# Patient Record
Sex: Female | Born: 1940 | Race: White | Hispanic: No | Smoking: Never smoker
Health system: Southern US, Community
[De-identification: ages and names within clinical notes are randomized; demographics above are authoritative.]

## PROBLEM LIST (undated history)

## (undated) DIAGNOSIS — M1990 Unspecified osteoarthritis, unspecified site: Secondary | ICD-10-CM

## (undated) DIAGNOSIS — I10 Essential (primary) hypertension: Secondary | ICD-10-CM

## (undated) DIAGNOSIS — C8590 Non-Hodgkin lymphoma, unspecified, unspecified site: Secondary | ICD-10-CM

## (undated) DIAGNOSIS — K219 Gastro-esophageal reflux disease without esophagitis: Secondary | ICD-10-CM

## (undated) DIAGNOSIS — E7800 Pure hypercholesterolemia, unspecified: Secondary | ICD-10-CM

## (undated) DIAGNOSIS — F0390 Unspecified dementia without behavioral disturbance: Secondary | ICD-10-CM

## (undated) DIAGNOSIS — R0600 Dyspnea, unspecified: Secondary | ICD-10-CM

## (undated) HISTORY — PX: TONSILLECTOMY: SUR1361

---

## 2000-04-18 ENCOUNTER — Emergency Department (HOSPITAL_COMMUNITY): Admission: EM | Admit: 2000-04-18 | Discharge: 2000-04-18 | Payer: Self-pay | Admitting: Emergency Medicine

## 2000-04-18 ENCOUNTER — Encounter: Payer: Self-pay | Admitting: Emergency Medicine

## 2009-11-05 ENCOUNTER — Emergency Department (HOSPITAL_COMMUNITY): Admission: EM | Admit: 2009-11-05 | Discharge: 2009-11-05 | Payer: Self-pay | Admitting: Emergency Medicine

## 2011-05-15 ENCOUNTER — Other Ambulatory Visit (HOSPITAL_COMMUNITY): Payer: Self-pay | Admitting: Internal Medicine

## 2011-05-15 ENCOUNTER — Ambulatory Visit (HOSPITAL_COMMUNITY)
Admission: RE | Admit: 2011-05-15 | Discharge: 2011-05-15 | Disposition: A | Discharge status: Home / Self Care | Payer: Medicare Other | Source: Ambulatory Visit | Attending: Internal Medicine | Admitting: Internal Medicine

## 2011-05-15 DIAGNOSIS — M79609 Pain in unspecified limb: Secondary | ICD-10-CM | POA: Insufficient documentation

## 2011-05-15 DIAGNOSIS — M542 Cervicalgia: Secondary | ICD-10-CM | POA: Insufficient documentation

## 2011-05-15 DIAGNOSIS — M5030 Other cervical disc degeneration, unspecified cervical region: Secondary | ICD-10-CM | POA: Insufficient documentation

## 2011-05-15 DIAGNOSIS — R52 Pain, unspecified: Secondary | ICD-10-CM

## 2011-05-15 DIAGNOSIS — M5380 Other specified dorsopathies, site unspecified: Secondary | ICD-10-CM | POA: Insufficient documentation

## 2011-05-26 ENCOUNTER — Other Ambulatory Visit (HOSPITAL_COMMUNITY): Payer: Self-pay | Admitting: Internal Medicine

## 2011-05-26 DIAGNOSIS — M542 Cervicalgia: Secondary | ICD-10-CM

## 2011-05-31 ENCOUNTER — Ambulatory Visit (HOSPITAL_COMMUNITY)
Admission: RE | Admit: 2011-05-31 | Discharge: 2011-05-31 | Disposition: A | Discharge status: Home / Self Care | Payer: Medicare Other | Source: Ambulatory Visit | Attending: Internal Medicine | Admitting: Internal Medicine

## 2011-05-31 DIAGNOSIS — M5030 Other cervical disc degeneration, unspecified cervical region: Secondary | ICD-10-CM | POA: Insufficient documentation

## 2011-05-31 DIAGNOSIS — M542 Cervicalgia: Secondary | ICD-10-CM

## 2011-05-31 DIAGNOSIS — M5380 Other specified dorsopathies, site unspecified: Secondary | ICD-10-CM | POA: Insufficient documentation

## 2011-07-28 ENCOUNTER — Other Ambulatory Visit: Payer: Self-pay | Admitting: Neurosurgery

## 2011-07-28 DIAGNOSIS — M47812 Spondylosis without myelopathy or radiculopathy, cervical region: Secondary | ICD-10-CM

## 2011-07-31 ENCOUNTER — Ambulatory Visit
Admission: RE | Admit: 2011-07-31 | Discharge: 2011-07-31 | Disposition: A | Discharge status: Home / Self Care | Payer: Medicare Other | Source: Ambulatory Visit | Attending: Neurosurgery | Admitting: Neurosurgery

## 2011-07-31 DIAGNOSIS — M47812 Spondylosis without myelopathy or radiculopathy, cervical region: Secondary | ICD-10-CM

## 2011-07-31 MED ORDER — IOHEXOL 300 MG/ML  SOLN
1.0000 mL | Freq: Once | INTRAMUSCULAR | Status: AC | PRN
  Administered 2011-07-31: 1 mL via INTRA_ARTICULAR

## 2011-07-31 MED ORDER — TRIAMCINOLONE ACETONIDE 40 MG/ML IJ SUSP (RADIOLOGY)
20.0000 mg | Freq: Once | INTRAMUSCULAR | Status: AC
  Administered 2011-07-31: 20 mg via INTRA_ARTICULAR

## 2012-10-29 ENCOUNTER — Other Ambulatory Visit (HOSPITAL_COMMUNITY): Payer: Self-pay | Admitting: Internal Medicine

## 2012-10-29 DIAGNOSIS — Z139 Encounter for screening, unspecified: Secondary | ICD-10-CM

## 2012-10-31 ENCOUNTER — Ambulatory Visit (HOSPITAL_COMMUNITY)
Admission: RE | Admit: 2012-10-31 | Discharge: 2012-10-31 | Disposition: A | Discharge status: Home / Self Care | Payer: Medicare Other | Source: Ambulatory Visit | Attending: Internal Medicine | Admitting: Internal Medicine

## 2012-10-31 DIAGNOSIS — Z139 Encounter for screening, unspecified: Secondary | ICD-10-CM

## 2012-10-31 DIAGNOSIS — Z1231 Encounter for screening mammogram for malignant neoplasm of breast: Secondary | ICD-10-CM | POA: Insufficient documentation

## 2012-11-15 ENCOUNTER — Ambulatory Visit (HOSPITAL_COMMUNITY)
Admission: RE | Admit: 2012-11-15 | Discharge: 2012-11-15 | Disposition: A | Discharge status: Home / Self Care | Payer: Medicare Other | Source: Ambulatory Visit | Attending: Internal Medicine | Admitting: Internal Medicine

## 2012-11-15 ENCOUNTER — Other Ambulatory Visit (HOSPITAL_COMMUNITY): Payer: Self-pay | Admitting: Internal Medicine

## 2012-11-15 DIAGNOSIS — M25551 Pain in right hip: Secondary | ICD-10-CM

## 2012-11-15 DIAGNOSIS — M25559 Pain in unspecified hip: Secondary | ICD-10-CM | POA: Insufficient documentation

## 2013-03-21 ENCOUNTER — Encounter (HOSPITAL_COMMUNITY): Payer: Self-pay | Admitting: *Deleted

## 2013-03-21 ENCOUNTER — Emergency Department (HOSPITAL_COMMUNITY)
Admission: EM | Admit: 2013-03-21 | Discharge: 2013-03-21 | Disposition: A | Discharge status: Home / Self Care | Payer: Medicare Other | Attending: Emergency Medicine | Admitting: Emergency Medicine

## 2013-03-21 DIAGNOSIS — Z862 Personal history of diseases of the blood and blood-forming organs and certain disorders involving the immune mechanism: Secondary | ICD-10-CM | POA: Insufficient documentation

## 2013-03-21 DIAGNOSIS — S61209A Unspecified open wound of unspecified finger without damage to nail, initial encounter: Secondary | ICD-10-CM | POA: Insufficient documentation

## 2013-03-21 DIAGNOSIS — S61215A Laceration without foreign body of left ring finger without damage to nail, initial encounter: Secondary | ICD-10-CM

## 2013-03-21 DIAGNOSIS — Y9389 Activity, other specified: Secondary | ICD-10-CM | POA: Insufficient documentation

## 2013-03-21 DIAGNOSIS — W230XXA Caught, crushed, jammed, or pinched between moving objects, initial encounter: Secondary | ICD-10-CM | POA: Insufficient documentation

## 2013-03-21 DIAGNOSIS — I10 Essential (primary) hypertension: Secondary | ICD-10-CM | POA: Insufficient documentation

## 2013-03-21 DIAGNOSIS — Y929 Unspecified place or not applicable: Secondary | ICD-10-CM | POA: Insufficient documentation

## 2013-03-21 DIAGNOSIS — Z8639 Personal history of other endocrine, nutritional and metabolic disease: Secondary | ICD-10-CM | POA: Insufficient documentation

## 2013-03-21 HISTORY — DX: Pure hypercholesterolemia, unspecified: E78.00

## 2013-03-21 HISTORY — DX: Essential (primary) hypertension: I10

## 2013-03-21 MED ORDER — LIDOCAINE HCL (PF) 1 % IJ SOLN
INTRAMUSCULAR | Status: AC
  Filled 2013-03-21: qty 5

## 2013-03-21 NOTE — ED Notes (Signed)
Pt states she was coming down from a ladder and ring got caught. Lace to left ring finger. NAD Bleeding controlled. States she received sutures less than 5 years ago and is pretty sure she had at tetanus shot at the time.

## 2013-03-22 NOTE — ED Provider Notes (Signed)
History     CSN: [id number]  Arrival date & time 03/21/13  1141   First MD Initiated Contact with Patient 03/21/13 1214      Chief Complaint  Patient presents with  . Extremity Laceration    (Consider location/radiation/quality/duration/timing/severity/associated sxs/prior treatment) HPI Comments: Alicia [name] is a 72 y.o. Female presenting with a laceration to the base of her left ring finger prior to arrival.  She was on a ladder harvesting fruit from her to retrieve when her wedding ring caught on the rung of the ladder causing this injury.  She was able to remove her ring prior to arrival here.  She denies weakness or numbness distal to the injury site and she has been able to control the bleeding with gentle pressure.  She is up-to-date on her tetanus status.  She denies any other injury.     The history is provided by the patient.    Past Medical History  Diagnosis Date  . Hypertension   . Hypercholesterolemia     Past Surgical History  Procedure Laterality Date  . Tonsillectomy      No family history on file.  History  Substance Use Topics  . Smoking status: Never Smoker   . Smokeless tobacco: Not on file  . Alcohol Use: No    OB History   Grav Para Term Preterm Abortions TAB SAB Ect Mult Living                  Review of Systems  Constitutional: Negative for fever and chills.  HENT: Negative for facial swelling.   Respiratory: Negative for shortness of breath and wheezing.   Skin: Positive for wound.  Neurological: Negative for numbness.    Allergies  Review of patient's allergies indicates no known allergies.  Home Medications  No current outpatient prescriptions on file.  BP 138/76  Pulse 60  Temp(Src) 98.4 F (36.9 C) (Oral)  Resp 20  Ht 5\' 6"  (1.676 m)  Wt 128 lb (58.06 kg)  BMI 20.67 kg/m2  SpO2 100%  Physical Exam  Constitutional: She is oriented to person, place, and time. She appears well-developed and well-nourished.   HENT:  Head: Normocephalic.  Cardiovascular: Normal rate.   Pulmonary/Chest: Effort normal.  Musculoskeletal: She exhibits tenderness.  Neurological: She is alert and oriented to person, place, and time. No sensory deficit.  Skin: Laceration noted.  2 cm irregular hemostatic laceration left volar proximal  ring finger.  Distal sensation intact, less than 3 second cap refill.  She displays full range of motion of the finger and has full  strength with resisted extension and flexion.      ED Course  Procedures (including critical care time)   LACERATION REPAIR Performed by: Burgess Amor Authorized by: Burgess Amor Consent: Verbal consent obtained. Risks and benefits: risks, benefits and alternatives were discussed Consent given by: patient Patient identity confirmed: provided demographic data Prepped and Draped in normal sterile fashion Wound explored  Laceration Location: left ring finger  Laceration Length: 2cm  No Foreign Bodies seen or palpated  Anesthesia: Digital block Local anesthetic: lidocaine 1% without epinephrine  Anesthetic total: 1 ml  Irrigation method: syringe Amount of cleaning: standard  Skin closure: Ethilon 4-oh   Number of sutures: 6   Technique: Simple interrupted   Patient tolerance: Patient tolerated the procedure well with no immediate complications.   Labs Reviewed - No data to display No results found.   1. Laceration of left ring finger w/o foreign body  w/o damage to nail, initial encounter       MDM  Dressing applied by RN.  Wound care instructions given.  Pt advised to have sutures removed in 10 days,  Return here sooner for any signs of infection including redness, swelling, worse pain or drainage of pus.  Old records reviewed, patient's tetanus was updated on 11/05/2009.           Burgess Amor, PA-C 03/22/13 1751  Burgess Amor, PA-C 03/22/13 1753

## 2013-03-25 NOTE — ED Provider Notes (Signed)
Medical screening examination/treatment/procedure(s) were performed by non-physician practitioner and as supervising physician I was immediately available for consultation/collaboration. Winifred Bodiford, MD, FACEP   Nickie Deren L Alfreddie Consalvo, MD 03/25/13 1632 

## 2015-11-24 DIAGNOSIS — E782 Mixed hyperlipidemia: Secondary | ICD-10-CM | POA: Diagnosis not present

## 2015-11-24 DIAGNOSIS — I10 Essential (primary) hypertension: Secondary | ICD-10-CM | POA: Diagnosis not present

## 2015-11-26 DIAGNOSIS — I10 Essential (primary) hypertension: Secondary | ICD-10-CM | POA: Diagnosis not present

## 2015-11-26 DIAGNOSIS — E782 Mixed hyperlipidemia: Secondary | ICD-10-CM | POA: Diagnosis not present

## 2015-11-26 DIAGNOSIS — K6289 Other specified diseases of anus and rectum: Secondary | ICD-10-CM | POA: Diagnosis not present

## 2016-05-29 DIAGNOSIS — H2513 Age-related nuclear cataract, bilateral: Secondary | ICD-10-CM | POA: Diagnosis not present

## 2016-05-31 DIAGNOSIS — E782 Mixed hyperlipidemia: Secondary | ICD-10-CM | POA: Diagnosis not present

## 2016-05-31 DIAGNOSIS — E538 Deficiency of other specified B group vitamins: Secondary | ICD-10-CM | POA: Diagnosis not present

## 2016-06-02 DIAGNOSIS — E782 Mixed hyperlipidemia: Secondary | ICD-10-CM | POA: Diagnosis not present

## 2016-06-02 DIAGNOSIS — R739 Hyperglycemia, unspecified: Secondary | ICD-10-CM | POA: Diagnosis not present

## 2016-06-02 DIAGNOSIS — R5383 Other fatigue: Secondary | ICD-10-CM | POA: Diagnosis not present

## 2016-06-02 DIAGNOSIS — I10 Essential (primary) hypertension: Secondary | ICD-10-CM | POA: Diagnosis not present

## 2016-06-02 DIAGNOSIS — R69 Illness, unspecified: Secondary | ICD-10-CM | POA: Diagnosis not present

## 2016-06-02 DIAGNOSIS — M65332 Trigger finger, left middle finger: Secondary | ICD-10-CM | POA: Diagnosis not present

## 2016-07-01 DIAGNOSIS — M79676 Pain in unspecified toe(s): Secondary | ICD-10-CM | POA: Diagnosis not present

## 2016-07-12 DIAGNOSIS — R69 Illness, unspecified: Secondary | ICD-10-CM | POA: Diagnosis not present

## 2016-12-05 DIAGNOSIS — R739 Hyperglycemia, unspecified: Secondary | ICD-10-CM | POA: Diagnosis not present

## 2016-12-05 DIAGNOSIS — E782 Mixed hyperlipidemia: Secondary | ICD-10-CM | POA: Diagnosis not present

## 2016-12-07 DIAGNOSIS — R69 Illness, unspecified: Secondary | ICD-10-CM | POA: Diagnosis not present

## 2016-12-07 DIAGNOSIS — R7301 Impaired fasting glucose: Secondary | ICD-10-CM | POA: Diagnosis not present

## 2016-12-07 DIAGNOSIS — I10 Essential (primary) hypertension: Secondary | ICD-10-CM | POA: Diagnosis not present

## 2016-12-07 DIAGNOSIS — R5383 Other fatigue: Secondary | ICD-10-CM | POA: Diagnosis not present

## 2016-12-07 DIAGNOSIS — E782 Mixed hyperlipidemia: Secondary | ICD-10-CM | POA: Diagnosis not present

## 2017-04-11 DIAGNOSIS — R69 Illness, unspecified: Secondary | ICD-10-CM | POA: Diagnosis not present

## 2017-06-06 DIAGNOSIS — E538 Deficiency of other specified B group vitamins: Secondary | ICD-10-CM | POA: Diagnosis not present

## 2017-06-06 DIAGNOSIS — R7301 Impaired fasting glucose: Secondary | ICD-10-CM | POA: Diagnosis not present

## 2017-06-06 DIAGNOSIS — I10 Essential (primary) hypertension: Secondary | ICD-10-CM | POA: Diagnosis not present

## 2017-06-08 DIAGNOSIS — R69 Illness, unspecified: Secondary | ICD-10-CM | POA: Diagnosis not present

## 2017-06-08 DIAGNOSIS — I10 Essential (primary) hypertension: Secondary | ICD-10-CM | POA: Diagnosis not present

## 2017-06-08 DIAGNOSIS — E782 Mixed hyperlipidemia: Secondary | ICD-10-CM | POA: Diagnosis not present

## 2017-06-08 DIAGNOSIS — Z6821 Body mass index (BMI) 21.0-21.9, adult: Secondary | ICD-10-CM | POA: Diagnosis not present

## 2017-09-13 DIAGNOSIS — R250 Abnormal head movements: Secondary | ICD-10-CM | POA: Diagnosis not present

## 2017-09-13 DIAGNOSIS — M25551 Pain in right hip: Secondary | ICD-10-CM | POA: Diagnosis not present

## 2017-09-13 DIAGNOSIS — M25511 Pain in right shoulder: Secondary | ICD-10-CM | POA: Diagnosis not present

## 2017-09-13 DIAGNOSIS — Z6820 Body mass index (BMI) 20.0-20.9, adult: Secondary | ICD-10-CM | POA: Diagnosis not present

## 2017-10-19 ENCOUNTER — Ambulatory Visit (HOSPITAL_COMMUNITY)
Admission: RE | Admit: 2017-10-19 | Discharge: 2017-10-19 | Disposition: A | Discharge status: Home / Self Care | Payer: Medicare HMO | Source: Ambulatory Visit | Attending: Adult Health Nurse Practitioner | Admitting: Adult Health Nurse Practitioner

## 2017-10-19 ENCOUNTER — Other Ambulatory Visit (HOSPITAL_COMMUNITY): Payer: Self-pay | Admitting: Adult Health Nurse Practitioner

## 2017-10-19 DIAGNOSIS — R1909 Other intra-abdominal and pelvic swelling, mass and lump: Secondary | ICD-10-CM

## 2017-10-19 DIAGNOSIS — R599 Enlarged lymph nodes, unspecified: Secondary | ICD-10-CM | POA: Diagnosis not present

## 2017-10-26 ENCOUNTER — Ambulatory Visit (HOSPITAL_COMMUNITY)
Admission: RE | Admit: 2017-10-26 | Discharge: 2017-10-26 | Disposition: A | Discharge status: Home / Self Care | Payer: Medicare HMO | Source: Ambulatory Visit | Attending: Internal Medicine | Admitting: Internal Medicine

## 2017-10-26 ENCOUNTER — Other Ambulatory Visit (HOSPITAL_COMMUNITY): Payer: Self-pay | Admitting: Internal Medicine

## 2017-10-26 DIAGNOSIS — R1909 Other intra-abdominal and pelvic swelling, mass and lump: Secondary | ICD-10-CM

## 2017-10-26 DIAGNOSIS — K5790 Diverticulosis of intestine, part unspecified, without perforation or abscess without bleeding: Secondary | ICD-10-CM | POA: Insufficient documentation

## 2017-10-26 DIAGNOSIS — R9389 Abnormal findings on diagnostic imaging of other specified body structures: Secondary | ICD-10-CM

## 2017-10-26 DIAGNOSIS — C8593 Non-Hodgkin lymphoma, unspecified, intra-abdominal lymph nodes: Secondary | ICD-10-CM | POA: Diagnosis not present

## 2017-10-26 DIAGNOSIS — I700 Atherosclerosis of aorta: Secondary | ICD-10-CM | POA: Insufficient documentation

## 2017-10-26 LAB — POCT I-STAT CREATININE: CREATININE: 0.9 mg/dL (ref 0.44–1.00)

## 2017-10-26 MED ORDER — IOPAMIDOL (ISOVUE-300) INJECTION 61%
100.0000 mL | Freq: Once | INTRAVENOUS | Status: AC | PRN
  Administered 2017-10-26: 100 mL via INTRAVENOUS

## 2017-10-31 ENCOUNTER — Telehealth: Payer: Self-pay | Admitting: Hematology

## 2017-10-31 NOTE — Telephone Encounter (Signed)
Spoke with patient regarding appointment D/T/Loc/Ph# °

## 2017-11-01 ENCOUNTER — Inpatient Hospital Stay: Payer: Medicare HMO | Attending: Hematology | Admitting: Hematology

## 2017-11-01 ENCOUNTER — Encounter: Payer: Self-pay | Admitting: Hematology

## 2017-11-01 ENCOUNTER — Telehealth: Payer: Self-pay | Admitting: Hematology

## 2017-11-01 ENCOUNTER — Inpatient Hospital Stay: Payer: Medicare HMO

## 2017-11-01 VITALS — BP 175/87 | HR 63 | Temp 98.3°F | Resp 20 | Ht 66.0 in | Wt 130.9 lb

## 2017-11-01 DIAGNOSIS — C8598 Non-Hodgkin lymphoma, unspecified, lymph nodes of multiple sites: Secondary | ICD-10-CM

## 2017-11-01 DIAGNOSIS — R591 Generalized enlarged lymph nodes: Secondary | ICD-10-CM

## 2017-11-01 DIAGNOSIS — R599 Enlarged lymph nodes, unspecified: Secondary | ICD-10-CM | POA: Diagnosis present

## 2017-11-01 DIAGNOSIS — Z8051 Family history of malignant neoplasm of kidney: Secondary | ICD-10-CM | POA: Diagnosis not present

## 2017-11-01 DIAGNOSIS — Z801 Family history of malignant neoplasm of trachea, bronchus and lung: Secondary | ICD-10-CM | POA: Insufficient documentation

## 2017-11-01 LAB — SEDIMENTATION RATE: SED RATE: 1 mm/h (ref 0–22)

## 2017-11-01 LAB — CMP (CANCER CENTER ONLY)
ALK PHOS: 83 U/L (ref 40–150)
ALT: 17 U/L (ref 0–55)
ANION GAP: 10 (ref 3–11)
AST: 21 U/L (ref 5–34)
Albumin: 4.5 g/dL (ref 3.5–5.0)
BILIRUBIN TOTAL: 0.5 mg/dL (ref 0.2–1.2)
BUN: 17 mg/dL (ref 7–26)
CALCIUM: 10.2 mg/dL (ref 8.4–10.4)
CO2: 29 mmol/L (ref 22–29)
Chloride: 104 mmol/L (ref 98–109)
Creatinine: 0.97 mg/dL (ref 0.60–1.10)
GFR, EST NON AFRICAN AMERICAN: 55 mL/min — AB (ref >60)
Glucose, Bld: 99 mg/dL (ref 70–140)
POTASSIUM: 3.9 mmol/L (ref 3.3–4.7)
Sodium: 143 mmol/L (ref 136–145)
TOTAL PROTEIN: 7.7 g/dL (ref 6.4–8.3)

## 2017-11-01 LAB — CBC WITH DIFFERENTIAL (CANCER CENTER ONLY)
BASOS ABS: 0.1 10*3/uL (ref 0.0–0.1)
BASOS PCT: 1 %
Eosinophils Absolute: 0.1 10*3/uL (ref 0.0–0.5)
Eosinophils Relative: 1 %
HEMATOCRIT: 47.1 % — AB (ref 34.8–46.6)
HEMOGLOBIN: 15.1 g/dL (ref 11.6–15.9)
LYMPHS PCT: 20 %
Lymphs Abs: 1.7 10*3/uL (ref 0.9–3.3)
MCH: 29.2 pg (ref 25.1–34.0)
MCHC: 32.1 g/dL (ref 31.5–36.0)
MCV: 90.9 fL (ref 79.5–101.0)
Monocytes Absolute: 0.8 10*3/uL (ref 0.1–0.9)
Monocytes Relative: 9 %
NEUTROS ABS: 6 10*3/uL (ref 1.5–6.5)
NEUTROS PCT: 69 %
Platelet Count: 196 10*3/uL (ref 145–400)
RBC: 5.18 MIL/uL (ref 3.70–5.45)
RDW: 13.6 % (ref 11.2–16.1)
WBC: 8.7 10*3/uL (ref 3.9–10.3)

## 2017-11-01 LAB — RETICULOCYTES
RBC.: 5.18 MIL/uL (ref 3.70–5.45)
RETIC COUNT ABSOLUTE: 62.2 10*3/uL (ref 33.7–90.7)
Retic Ct Pct: 1.2 % (ref 0.7–2.1)

## 2017-11-01 LAB — LACTATE DEHYDROGENASE: LDH: 200 U/L (ref 125–245)

## 2017-11-01 NOTE — Telephone Encounter (Signed)
Gave avs and calendar for february °

## 2017-11-01 NOTE — Progress Notes (Signed)
HEMATOLOGY/ONCOLOGY CONSULTATION NOTE  Date of Service: 11/01/2017  Patient Care Team: Celene Squibb, MD as PCP - General (Internal Medicine)  CHIEF COMPLAINTS/PURPOSE OF CONSULTATION:  Left inguinal Lymphadenopathy  HISTORY OF PRESENTING ILLNESS:   Alicia [name] is a wonderful 77 y.o. female who has been referred to [place] by Dr. Allyn Kenner for evaluation and management of enlarged lymph node to left inguinal area.   She is accompanied by her daughter today. She notes that she is doing well overall. She notes that she initially presented with a palpable lump to her left inguinal area for approximately 3-4 weeks. She denies pain to the area, but notes that the area feels larger today. She reports that she typically goes to the doctor twice a year for a medication refill.   She had an [place] on 10/19/17 significant for: IMPRESSION: Multiple prominent masses in the left groin with the largest measuring up to 4.4 cm. These are most likely lymph nodes.   Subsequently, she had a CT AP with contrast completed on 10/26/17 with results of: IMPRESSION: CT evidence of lymphoma, with bulky lymph nodes in multiple nodal stations of the abdomen and pelvis, predominantly left inguinal, left iliac, and the para-aortic nodes. 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   She has a PMHx of HTN and high cholesterol which she takes medications for. She denies a PMHx of DM, cardiac issues, abdominal issues, lung issues, surgeries, or major injuries. She denies smoking cigarettes, drinking ETOH, or chemical exposure.  Her father had a hx of kidney cancer and her mother with lung cancer. Her mother did smoke cigarettes. She denies any other family hx of blood disorders or blood cancers. She has two siblings whom have had either a kidney removal or on dialysis respectively. 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    On review of  systems, she reports a slower urinary stream, urinary urgency x several years, resolved diarrhea x yesterday, chronic left hip pain x several years, and chronic right shoulder pain (previously diagnosed with bursitis). She denies her urgency being urgent to need an evaluation. She attributes the diarrhea to something that she ate yesterday. She notes a prior hx of infection to her great toe which she self-treated with Listerine soaks and OTC antifungal creams. She denies abdominal pain, vaginal discharge, fever, chills, night sweats, unexpected weight loss, leg swelling, bowel issues, urinary frequency, vaginal bleeding, foot/toe infection, abdominal trauma, bowel habit changes, prior hx of diverticulitis, lumps anywhere else on the body, and any other symptoms.   MEDICAL HISTORY:  Past Medical History:  Diagnosis Date  . Hypercholesterolemia   . Hypertension     SURGICAL HISTORY: Past Surgical History:  Procedure Laterality Date  . TONSILLECTOMY      SOCIAL HISTORY: Social History   Socioeconomic History  . Marital status: Widowed    Spouse name: Not on file  . Number of children: Not on file  . Years of education: Not on file  . Highest education level: Not on file  Social Needs  . Financial resource strain: Not on file  . Food insecurity - worry: Not on file  . Food insecurity - inability: Not on file  . Transportation needs - medical: Not on file  . Transportation needs - non-medical: Not on file  Occupational History  . Not on file  Tobacco Use  . Smoking status: Never Smoker  .  Smokeless tobacco: Never Used  Substance and Sexual Activity  . Alcohol use: No  . Drug use: No  . Sexual activity: Not on file  Other Topics Concern  . Not on file  Social History Narrative  . Not on file    FAMILY HISTORY: History reviewed. No pertinent family history.  ALLERGIES:  has No Known Allergies.  MEDICATIONS:  Current Outpatient Medications  Medication Sig Dispense Refill    . acetaminophen (TYLENOL) 500 MG tablet Take 500 mg by mouth at bedtime.    . bisoprolol-hydrochlorothiazide (ZIAC) 5-6.25 MG tablet Take 1 tablet by mouth 2 (two) times daily.    Marland Kitchen ezetimibe-simvastatin (VYTORIN) 10-10 MG tablet Take 1 tablet by mouth at bedtime.    . Melatonin 3-10 MG TABS Take 0.5 tablets by mouth daily.    Marland Kitchen omega-3 acid ethyl esters (LOVAZA) 1 g capsule Take by mouth daily.    . Turmeric Curcumin 500 MG CAPS Take 500 mg by mouth daily. Contains 50mg  ginger powder     No current facility-administered medications for this visit.     REVIEW OF SYSTEMS:    10 Point review of Systems was done is negative except as noted above.  PHYSICAL EXAMINATION:  ECOG PERFORMANCE STATUS: 0 - Asymptomatic  . Vitals:   11/01/17 1101  BP: (!) 175/87  Pulse: 63  Resp: 20  Temp: 98.3 F (36.8 C)  SpO2: 98%   Filed Weights   11/01/17 1101  Weight: 130 lb 14.4 oz (59.4 kg)   .Body mass index is 21.13 kg/m.  GENERAL:alert, in no acute distress and comfortable SKIN: no acute rashes, no significant lesions EYES: conjunctiva are pink and non-injected, sclera anicteric OROPHARYNX: MMM, no exudates, no oropharyngeal erythema or ulceration NECK: supple, no JVD LYMPH:  no palpable lymphadenopathy in the cervical, axillary regions. Multiple enlarged left inguinal lymph node matted together largest about 4 cm in size. LUNGS: clear to auscultation b/l with normal respiratory effort HEART: regular rate & rhythm ABDOMEN:  normoactive bowel sounds , non tender, not distended. Extremity: no pedal edema PSYCH: alert & oriented x 3 with fluent speech NEURO: no focal motor/sensory deficits  LABORATORY DATA:  I have reviewed the data as listed  . CBC Latest Ref Rng & Units 11/01/2017  WBC 3.9 - 10.3 K/uL 8.7  Hematocrit 34.8 - 46.6 % 47.1(H)  Platelets 145 - 400 K/uL 196  hgb 15.1 . CBC    Component Value Date/Time   WBC 8.7 11/01/2017 1231   RBC 5.18 11/01/2017 1231   RBC  5.18 11/01/2017 1231   HCT 47.1 (H) 11/01/2017 1231   PLT 196 11/01/2017 1231   MCV 90.9 11/01/2017 1231   MCH 29.2 11/01/2017 1231   MCHC 32.1 11/01/2017 1231   RDW 13.6 11/01/2017 1231   LYMPHSABS 1.7 11/01/2017 1231   MONOABS 0.8 11/01/2017 1231   EOSABS 0.1 11/01/2017 1231   BASOSABS 0.1 11/01/2017 1231   . CMP Latest Ref Rng & Units 11/01/2017 10/26/2017  Glucose 70 - 140 mg/dL 99 -  BUN 7 - 26 mg/dL 17 -  Creatinine 0.44 - 1.00 mg/dL - 0.90  Sodium 136 - 145 mmol/L 143 -  Potassium 3.3 - 4.7 mmol/L 3.9 -  Chloride 98 - 109 mmol/L 104 -  CO2 22 - 29 mmol/L 29 -  Calcium 8.4 - 10.4 mg/dL 10.2 -  Total Protein 6.4 - 8.3 g/dL 7.7 -  Total Bilirubin 0.2 - 1.2 mg/dL 0.5 -  Alkaline Phos 40 - 150 U/L  83 -  AST 5 - 34 U/L 21 -  ALT 0 - 55 U/L 17 -   . Lab Results  Component Value Date   LDH 200 11/01/2017   Component     Latest Ref Rng & Units 11/01/2017  Retic Ct Pct     0.7 - 2.1 % 1.2  RBC.     3.70 - 5.45 MIL/uL 5.18  Retic Count, Absolute     33.7 - 90.7 K/uL 62.2  Hep B Core Ab, Tot     Negative Negative  Hepatitis B Surface Ag     Negative Negative  HCV Ab     0.0 - 0.9 s/co ratio <0.1  HIV Screen 4th Generation wRfx     Non Reactive Non Reactive  Sed Rate     0 - 22 mm/hr 1     RADIOGRAPHIC STUDIES: I have personally reviewed the radiological images as listed and agreed with the findings in the report. US Pelvis Limited (transabdominal Only)  Result Date: 10/19/2017 CLINICAL DATA:  Groin mass. EXAM: LIMITED ULTRASOUND OF PELVIS TECHNIQUE: Limited transabdominal ultrasound examination of the pelvis was performed. COMPARISON:  [facility] 11/15/2012. FINDINGS: Multiple prominent solid masses in the left groin, the largest measures 4.4 x 3.7 x 3.6 cm. Vascular flow noted within the masses. These are most consistent with lymph nodes. Adenopathy could be secondary to infection or malignancy. No hernia identified. Confirmation and further evaluation with  contrast-enhanced CT of the abdomen pelvis can be obtained . IMPRESSION: Multiple prominent masses in the left groin with the largest measuring up to 4.4 cm. These are most likely lymph nodes. Electronically Signed   By: Marcello Moores  Register   On: 10/19/2017 12:53   Ct Abdomen Pelvis W Contrast  Result Date: 10/26/2017 CLINICAL DATA:  77 year old female with a history inguinal lymph nodes EXAM: CT ABDOMEN AND PELVIS WITH CONTRAST TECHNIQUE: Multidetector CT imaging of the abdomen and pelvis was performed using the standard protocol following bolus administration of intravenous contrast. CONTRAST:  148mL ISOVUE-300 IOPAMIDOL (ISOVUE-300) INJECTION 61% COMPARISON:  Ultrasound inguinal region 10/19/2017 FINDINGS: Lower chest: No acute abnormality. Hepatobiliary: Unremarkable liver parenchyma. Unremarkable gallbladder which is relatively decompressed. No radiopaque stones. No inflammatory changes. Pancreas: Unremarkable pancreas Spleen: Unremarkable spleen Adrenals/Urinary Tract: Unremarkable adrenal glands. Right kidney demonstrates no hydronephrosis. No nephrolithiasis. Unremarkable appearance of the cortex perfusion. Unremarkable course of the right ureter, which does not appear obstructed. Left kidney unremarkable with no hydronephrosis. There may be a tiny 1 mm nonobstructive stone in the interpolar collecting system. No inflammatory changes. Unremarkable appearance of the cortex perfusion. Unremarkable course the left ureter which does not appear obstructed. Rounded cystic lesion on the lateral cortex measures 13 mm, likely simple cyst. Unremarkable appearance of the urinary bladder. Stomach/Bowel: Unremarkable appearance of stomach. Small hiatal hernia. Unremarkable small bowel. No abnormal distention. Normal appendix. Mild formed stool burden. No inflammatory changes adjacent to small bowel or colon. Multiple colonic diverticula without associated inflammatory changes. Vascular/Lymphatic: Scattered  calcifications of the abdominal aorta. No aneurysm or dissection flap. Calcifications of the bilateral iliac arteries which remain patent. Proximal femoral arteries remain patent. Multiple enlarged and borderline enlarged lymph nodes. Enlarged lymph nodes of the left inguinal region, as was demonstrated on the recent ultrasound survey. Largest nodal mass measures 3.6 cm. There are multiple enlarged lymph nodes of the left iliac nodal stations, with the largest mass within the pelvis measuring 2.8 cm. There are multiple small/enlarging nodes of the right inguinal region and right iliac nodal  station. Largest node on the right measures 1.4 cm. Multiple enlarged para-aortic and preaortic nodes. Largest adjacent to the aorta measures 2.6 cm on the right. No enlarged lymph nodes extending through the hiatus into the chest. No enlarged nodes of the visualized lower mediastinum. Enlarged lymph node at the base the mesentery measures 2.0 cm. No free fluid. Reproductive: Unremarkable appearance of uterus and the adnexa. Other: Small fat containing umbilical hernia Musculoskeletal: No acute fracture. Multilevel degenerative changes of the thoracolumbar spine. Schmorl's node at the superior endplate of O03. Vacuum disc phenomenon at L3-L4, L4-L5. Perineural cysts of the bilateral sacral foramina IMPRESSION: CT evidence of lymphoma, with bulky lymph nodes in multiple nodal stations of the abdomen and pelvis, predominantly left inguinal, left iliac, and the para-aortic nodes. Referral for oncologic evaluation recommended. 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 Aortic atherosclerosis.  Aortic Atherosclerosis (ICD10-I70.0). Electronically Signed   By: Corrie Mckusick D.O.   On: 10/26/2017 14:57    ASSESSMENT & PLAN:  77 y.o. is a female with prior hx of HTN and high cholesterol    1. Large left inguinal lymphadenopathy  2. Significant intra-abdominal generalized lymphadenopathy Overall significant  concern for lymphoma or other malignancy. HIV non reactive PLan -Discussed with patient and her daughter regarding recent imaging studies available today.  -No symptoms suggest an intra-abd /pelvic inflammatory process at this time.  -discussed with the patient and her daughter concern for lymphoma or other tumor though there are other potential less likely infectious/inflammatory etiologies (though less likely) -discussed and recommended with the patient and her daughter that an US-guided needle core biopsy of the main left inguinal node would be utilized to rule out lymphoma within 1 week. Patient agreed to undergo US-guided needle core biopsy at this time.  -if needle biopsy is non diagnostic/indetermine might need excision LN bx by surgery -Discussed and advised that a PET scan would be ordered and completed within 1 week to further evaluate extent of disease and to guide strategy for further evaluation and management -Recommended labs today -recommended to avoid steroid use prior to biopsy to prevent lymph node decreasing in size and affecting sample Labs today   Plan:  Labs today US guided biopsy of left inguinal LN in 3-4 days PET/CT in 3-4 days RTC with Dr Irene Limbo in 10-12 days after US biopsy and PET/CT  All of the patients questions were answered with apparent satisfaction. The patient knows to call the clinic with any problems, questions or concerns.  I spent 40 minutes counseling the patient face to face. The total time spent in the appointment was 50 minutes and more than 50% was on counseling and direct patient cares.    Sullivan Lone MD MS AAHIVMS [facility] Hematology/Oncology Physician [facility]  (Office):       [phone] (Work cell):  [phone] (Fax):           [phone]  11/01/2017 12:20 PM    This document serves as a record of services personally performed by Sullivan Lone, MD. It was created on his behalf by Steva Colder, a trained medical  scribe. The creation of this record is based on the scribe's personal observations and the provider's statements to them.   .I have reviewed the above documentation for accuracy and completeness, and I agree with the above. Brunetta Genera MD MS

## 2017-11-02 LAB — HEPATITIS B SURFACE ANTIGEN: HEP B S AG: NEGATIVE

## 2017-11-02 LAB — HEPATITIS C ANTIBODY: HCV Ab: <0.1 s/co ratio (ref 0.0–0.9)

## 2017-11-02 LAB — HEPATITIS B CORE ANTIBODY, TOTAL: Hep B Core Total Ab: NEGATIVE

## 2017-11-02 LAB — HIV ANTIBODY (ROUTINE TESTING W REFLEX): HIV Screen 4th Generation wRfx: NONREACTIVE

## 2017-11-07 ENCOUNTER — Other Ambulatory Visit: Payer: Self-pay | Admitting: Radiology

## 2017-11-08 ENCOUNTER — Ambulatory Visit (HOSPITAL_COMMUNITY)
Admission: RE | Admit: 2017-11-08 | Discharge: 2017-11-08 | Disposition: A | Discharge status: Home / Self Care | Payer: Medicare HMO | Source: Ambulatory Visit | Attending: Hematology | Admitting: Hematology

## 2017-11-08 ENCOUNTER — Encounter (HOSPITAL_COMMUNITY): Payer: Self-pay

## 2017-11-08 DIAGNOSIS — K449 Diaphragmatic hernia without obstruction or gangrene: Secondary | ICD-10-CM | POA: Insufficient documentation

## 2017-11-08 DIAGNOSIS — Z79899 Other long term (current) drug therapy: Secondary | ICD-10-CM | POA: Diagnosis not present

## 2017-11-08 DIAGNOSIS — R591 Generalized enlarged lymph nodes: Secondary | ICD-10-CM

## 2017-11-08 DIAGNOSIS — I10 Essential (primary) hypertension: Secondary | ICD-10-CM | POA: Diagnosis not present

## 2017-11-08 DIAGNOSIS — C8515 Unspecified B-cell lymphoma, lymph nodes of inguinal region and lower limb: Secondary | ICD-10-CM | POA: Insufficient documentation

## 2017-11-08 DIAGNOSIS — C8295 Follicular lymphoma, unspecified, lymph nodes of inguinal region and lower limb: Secondary | ICD-10-CM | POA: Diagnosis not present

## 2017-11-08 DIAGNOSIS — E785 Hyperlipidemia, unspecified: Secondary | ICD-10-CM | POA: Insufficient documentation

## 2017-11-08 DIAGNOSIS — R590 Localized enlarged lymph nodes: Secondary | ICD-10-CM | POA: Diagnosis not present

## 2017-11-08 DIAGNOSIS — E7800 Pure hypercholesterolemia, unspecified: Secondary | ICD-10-CM | POA: Diagnosis not present

## 2017-11-08 DIAGNOSIS — I700 Atherosclerosis of aorta: Secondary | ICD-10-CM | POA: Insufficient documentation

## 2017-11-08 DIAGNOSIS — K429 Umbilical hernia without obstruction or gangrene: Secondary | ICD-10-CM | POA: Diagnosis not present

## 2017-11-08 DIAGNOSIS — C8598 Non-Hodgkin lymphoma, unspecified, lymph nodes of multiple sites: Secondary | ICD-10-CM

## 2017-11-08 LAB — CBC
HCT: 43.5 % (ref 36.0–46.0)
Hemoglobin: 14.4 g/dL (ref 12.0–15.0)
MCH: 29.6 pg (ref 26.0–34.0)
MCHC: 33.1 g/dL (ref 30.0–36.0)
MCV: 89.5 fL (ref 78.0–100.0)
PLATELETS: 183 10*3/uL (ref 150–400)
RBC: 4.86 MIL/uL (ref 3.87–5.11)
RDW: 13.5 % (ref 11.5–15.5)
WBC: 7.4 10*3/uL (ref 4.0–10.5)

## 2017-11-08 LAB — PROTIME-INR
INR: 1.12
PROTHROMBIN TIME: 14.3 s (ref 11.4–15.2)

## 2017-11-08 LAB — APTT: APTT: 29 s (ref 24–36)

## 2017-11-08 MED ORDER — MIDAZOLAM HCL 2 MG/2ML IJ SOLN
INTRAMUSCULAR | Status: AC
  Filled 2017-11-08: qty 4

## 2017-11-08 MED ORDER — HYDROCODONE-ACETAMINOPHEN 5-325 MG PO TABS: 1.0000 | ORAL_TABLET | ORAL | Status: DC | PRN

## 2017-11-08 MED ORDER — SODIUM CHLORIDE 0.9 % IV SOLN
INTRAVENOUS | Status: DC
  Administered 2017-11-08: 12:00:00 via INTRAVENOUS

## 2017-11-08 MED ORDER — MIDAZOLAM HCL 2 MG/2ML IJ SOLN
INTRAMUSCULAR | Status: AC | PRN
  Administered 2017-11-08 (×3): 1 mg via INTRAVENOUS

## 2017-11-08 MED ORDER — LIDOCAINE HCL 1 % IJ SOLN
INTRAMUSCULAR | Status: AC
  Filled 2017-11-08: qty 20

## 2017-11-08 MED ORDER — FENTANYL CITRATE (PF) 100 MCG/2ML IJ SOLN
INTRAMUSCULAR | Status: AC | PRN
  Administered 2017-11-08: 25 ug via INTRAVENOUS
  Administered 2017-11-08: 50 ug via INTRAVENOUS

## 2017-11-08 MED ORDER — FENTANYL CITRATE (PF) 100 MCG/2ML IJ SOLN
INTRAMUSCULAR | Status: AC
  Filled 2017-11-08: qty 2

## 2017-11-08 NOTE — Procedures (Signed)
  Procedure: [place] core bx L inguinal LAN 18g x4 in saline EBL:   minimal Complications:  none immediate  See full dictation in [facility].  Dillard Cannon MD Main # [phone] Pager  [phone]

## 2017-11-08 NOTE — Consult Note (Signed)
Chief Complaint: Patient was seen in consultation today for image guided biopsy of left inguinal lymphadenopathy  Referring Physician(s): Brunetta Genera  Supervising Physician: Arne Cleveland  Patient Status: [facility] - Out-pt  History of Present Illness: Alicia [name] is a 77 y.o. female with history of hypertension, hyperlipidemia, and reported 2-week history of a nontender enlarged left inguinal lymph node.  Patient has no prior history of cancer.  Subsequent imaging revealed bulky lymph nodes in multiple nodal stations of the abdomen and pelvis, predominantly left inguinal, left iliac and periaortic nodes concerning for lymphoma.  She presents today for image guided left inguinal lymph node biopsy for further evaluation.  Past Medical History:  Diagnosis Date  . Hypercholesterolemia   . Hypertension     Past Surgical History:  Procedure Laterality Date  . TONSILLECTOMY      Allergies: Patient has no known allergies.  Medications: Prior to Admission medications   Medication Sig Start Date End Date Taking? Authorizing Provider  acetaminophen (TYLENOL) 500 MG tablet Take 500 mg by mouth at bedtime.    [provider]  bisoprolol-hydrochlorothiazide (ZIAC) 5-6.25 MG tablet Take 1 tablet by mouth 2 (two) times daily.    [provider]  ezetimibe-simvastatin (VYTORIN) 10-10 MG tablet Take 1 tablet by mouth at bedtime.    [provider]  Melatonin 3-10 MG TABS Take 0.5 tablets by mouth daily.    [provider]  omega-3 acid ethyl esters (LOVAZA) 1 g capsule Take by mouth daily.    [provider]  Turmeric Curcumin 500 MG CAPS Take 500 mg by mouth daily. Contains 50mg  ginger powder    [provider]     No family history on file.  Social History   Socioeconomic History  . Marital status: Widowed    Spouse name: Not on file  . Number of children: Not on file  . Years of education: Not on file  . Highest  education level: Not on file  Social Needs  . Financial resource strain: Not on file  . Food insecurity - worry: Not on file  . Food insecurity - inability: Not on file  . Transportation needs - medical: Not on file  . Transportation needs - non-medical: Not on file  Occupational History  . Not on file  Tobacco Use  . Smoking status: Never Smoker  . Smokeless tobacco: Never Used  Substance and Sexual Activity  . Alcohol use: No  . Drug use: No  . Sexual activity: Not on file  Other Topics Concern  . Not on file  Social History Narrative  . Not on file      Review of Systems currently denies fever, headache, chest pain, dyspnea, cough, abdominal pain, nausea, vomiting or bleeding.  She does have occasional back pain and left shoulder discomfort.  Vital Signs: BP (!) 145/84   Pulse 67   Temp 98.3 F (36.8 C) (Oral)   Resp 18   Ht 5\' 6"  (1.676 m)   Wt 130 lb (59 kg)   SpO2 100%   BMI 20.98 kg/m   Physical Exam awake, alert.  Chest clear to auscultation bilaterally.  Heart with regular rate and rhythm.  Abdomen soft, positive bowel sounds, nontender; firm, palpable, nontender left inguinal lymphadenopathy ;no lower extremity edema  Imaging: US Pelvis Limited (transabdominal Only)  Result Date: 10/19/2017 CLINICAL DATA:  Groin mass. EXAM: LIMITED ULTRASOUND OF PELVIS TECHNIQUE: Limited transabdominal ultrasound examination of the pelvis was performed. COMPARISON:  [facility] 11/15/2012. FINDINGS:  Multiple prominent solid masses in the left groin, the largest measures 4.4 x 3.7 x 3.6 cm. Vascular flow noted within the masses. These are most consistent with lymph nodes. Adenopathy could be secondary to infection or malignancy. No hernia identified. Confirmation and further evaluation with contrast-enhanced CT of the abdomen pelvis can be obtained . IMPRESSION: Multiple prominent masses in the left groin with the largest measuring up to 4.4 cm. These are most likely lymph nodes.  Electronically Signed   By: Marcello Moores  Register   On: 10/19/2017 12:53   Ct Abdomen Pelvis W Contrast  Result Date: 10/26/2017 CLINICAL DATA:  77 year old female with a history inguinal lymph nodes EXAM: CT ABDOMEN AND PELVIS WITH CONTRAST TECHNIQUE: Multidetector CT imaging of the abdomen and pelvis was performed using the standard protocol following bolus administration of intravenous contrast. CONTRAST:  149mL ISOVUE-300 IOPAMIDOL (ISOVUE-300) INJECTION 61% COMPARISON:  Ultrasound inguinal region 10/19/2017 FINDINGS: Lower chest: No acute abnormality. Hepatobiliary: Unremarkable liver parenchyma. Unremarkable gallbladder which is relatively decompressed. No radiopaque stones. No inflammatory changes. Pancreas: Unremarkable pancreas Spleen: Unremarkable spleen Adrenals/Urinary Tract: Unremarkable adrenal glands. Right kidney demonstrates no hydronephrosis. No nephrolithiasis. Unremarkable appearance of the cortex perfusion. Unremarkable course of the right ureter, which does not appear obstructed. Left kidney unremarkable with no hydronephrosis. There may be a tiny 1 mm nonobstructive stone in the interpolar collecting system. No inflammatory changes. Unremarkable appearance of the cortex perfusion. Unremarkable course the left ureter which does not appear obstructed. Rounded cystic lesion on the lateral cortex measures 13 mm, likely simple cyst. Unremarkable appearance of the urinary bladder. Stomach/Bowel: Unremarkable appearance of stomach. Small hiatal hernia. Unremarkable small bowel. No abnormal distention. Normal appendix. Mild formed stool burden. No inflammatory changes adjacent to small bowel or colon. Multiple colonic diverticula without associated inflammatory changes. Vascular/Lymphatic: Scattered calcifications of the abdominal aorta. No aneurysm or dissection flap. Calcifications of the bilateral iliac arteries which remain patent. Proximal femoral arteries remain patent. Multiple enlarged and  borderline enlarged lymph nodes. Enlarged lymph nodes of the left inguinal region, as was demonstrated on the recent ultrasound survey. Largest nodal mass measures 3.6 cm. There are multiple enlarged lymph nodes of the left iliac nodal stations, with the largest mass within the pelvis measuring 2.8 cm. There are multiple small/enlarging nodes of the right inguinal region and right iliac nodal station. Largest node on the right measures 1.4 cm. Multiple enlarged para-aortic and preaortic nodes. Largest adjacent to the aorta measures 2.6 cm on the right. No enlarged lymph nodes extending through the hiatus into the chest. No enlarged nodes of the visualized lower mediastinum. Enlarged lymph node at the base the mesentery measures 2.0 cm. No free fluid. Reproductive: Unremarkable appearance of uterus and the adnexa. Other: Small fat containing umbilical hernia Musculoskeletal: No acute fracture. Multilevel degenerative changes of the thoracolumbar spine. Schmorl's node at the superior endplate of S28. Vacuum disc phenomenon at L3-L4, L4-L5. Perineural cysts of the bilateral sacral foramina IMPRESSION: CT evidence of lymphoma, with bulky lymph nodes in multiple nodal stations of the abdomen and pelvis, predominantly left inguinal, left iliac, and the para-aortic nodes. Referral for oncologic evaluation recommended. 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 Aortic atherosclerosis.  Aortic Atherosclerosis (ICD10-I70.0). Electronically Signed   By: Corrie Mckusick D.O.   On: 10/26/2017 14:57    Labs:  CBC: Recent Labs    11/01/17 1231  WBC 8.7  HCT 47.1*  PLT 196    COAGS: No results for input(s): INR, APTT in the last 8760 hours.  BMP: Recent Labs    10/26/17 1425 11/01/17 1231  NA  --  143  K  --  3.9  CL  --  104  CO2  --  29  GLUCOSE  --  99  BUN  --  17  CALCIUM  --  10.2  CREATININE 0.90  --   GFRNONAA  --  55*  GFRAA  --  >60    LIVER FUNCTION TESTS: Recent Labs      11/01/17 1231  BILITOT 0.5  AST 21  ALT 17  ALKPHOS 83  PROT 7.7  ALBUMIN 4.5    TUMOR MARKERS: No results for input(s): AFPTM, CEA, CA199, CHROMGRNA in the last 8760 hours.  Assessment and Plan: 77 y.o. female with history of hypertension, hyperlipidemia, and reported 2-week history of a nontender enlarged left inguinal lymph node.  Patient has no prior history of cancer.  Subsequent imaging revealed bulky lymph nodes in multiple nodal stations of the abdomen and pelvis, predominantly left inguinal, left iliac and periaortic nodes concerning for lymphoma.  She presents today for image guided left inguinal lymph node biopsy for further evaluation.Risks and benefits discussed with the patient including, but not limited to bleeding, infection, damage to adjacent structures or low yield requiring additional tests. All of the patient's questions were answered, patient is agreeable to proceed. Consent signed and in chart.Labs pend.     Thank you for this interesting consult.  I greatly enjoyed meeting Alicia [name] and look forward to participating in their care.  A copy of this report was sent to the requesting provider on this date.  Electronically Signed: D. Rowe Robert, PA-C 11/08/2017, 11:25 AM   I spent a total of  20 minutes   in face to face in clinical consultation, greater than 50% of which was counseling/coordinating care for image guided left inguinal lymph node biopsy

## 2017-11-09 DIAGNOSIS — C8295 Follicular lymphoma, unspecified, lymph nodes of inguinal region and lower limb: Secondary | ICD-10-CM | POA: Diagnosis not present

## 2017-11-15 NOTE — Progress Notes (Signed)
HEMATOLOGY/ONCOLOGY CLINIC NOTE  Date of Service: 11/20/2017  Patient Care Team: Celene Squibb, MD as PCP - General (Internal Medicine)  CHIEF COMPLAINTS/PURPOSE OF CONSULTATION:  Non-Hodgkin's B Cell Lymphoma   HISTORY OF PRESENTING ILLNESS:   Alicia [name] is a wonderful 77 y.o. female who has been referred to [place] by Dr. Allyn Kenner for evaluation and management of enlarged lymph node to left inguinal area.   She is accompanied by her daughter today. She notes that she is doing well overall. She notes that she initially presented with a palpable lump to her left inguinal area for approximately 3-4 weeks. She denies pain to the area, but notes that the area feels larger today. She reports that she typically goes to the doctor twice a year for a medication refill.   She had an [place] on 10/19/17 significant for: IMPRESSION: Multiple prominent masses in the left groin with the largest measuring up to 4.4 cm. These are most likely lymph nodes.   Subsequently, she had a CT AP with contrast completed on 10/26/17 with results of: IMPRESSION: CT evidence of lymphoma, with bulky lymph nodes in multiple nodal stations of the abdomen and pelvis, predominantly left inguinal, left iliac, and the para-aortic nodes. 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   She has a PMHx of HTN and high cholesterol which she takes medications for. She denies a PMHx of DM, cardiac issues, abdominal issues, lung issues, surgeries, or major injuries. She denies smoking cigarettes, drinking ETOH, or chemical exposure.  Her father had a hx of kidney cancer and her mother with lung cancer. Her mother did smoke cigarettes. She denies any other family hx of blood disorders or blood cancers. She has two siblings whom have had either a kidney removal or on dialysis respectively. 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    On review of  systems, she reports a slower urinary stream, urinary urgency x several years, resolved diarrhea x yesterday, chronic left hip pain x several years, and chronic right shoulder pain (previously diagnosed with bursitis). She denies her urgency being urgent to need an evaluation. She attributes the diarrhea to something that she ate yesterday. She notes a prior hx of infection to her great toe which she self-treated with Listerine soaks and OTC antifungal creams. She denies abdominal pain, vaginal discharge, fever, chills, night sweats, unexpected weight loss, leg swelling, bowel issues, urinary frequency, vaginal bleeding, foot/toe infection, abdominal trauma, bowel habit changes, prior hx of diverticulitis, lumps anywhere else on the body, and any other symptoms.    INTERVAL HISTORY   Alicia [name] is here for a follow up and to review further workup of inguinal lymphadenopathy. 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 She also had a PET scan on 11/19/17 which showed enlarged active LN primarily in her abdomen and small LN in her neck and chest. No involvements of other structures.   She presents to the clinic today she is accompanied by her daughter and her granddaughter. Patient notes her work up procedures went well for her. She will see her PCP next month.   On review of symptoms, pt notes to eating well. She notes her BP will drop at times. She will check her BP, rest and will let it recover.  She denies, fever, chills or night sweats.  MEDICAL HISTORY:  Past Medical History:  Diagnosis Date  . Hypercholesterolemia   . Hypertension     SURGICAL HISTORY: Past Surgical History:  Procedure Laterality Date  . TONSILLECTOMY      SOCIAL HISTORY: Social History   Socioeconomic History  . Marital status: Widowed    Spouse name: Not on file  . Number of children: Not on file  . Years of education: Not on file  .  Highest education level: Not on file  Social Needs  . Financial resource strain: Not on file  . Food insecurity - worry: Not on file  . Food insecurity - inability: Not on file  . Transportation needs - medical: Not on file  . Transportation needs - non-medical: Not on file  Occupational History  . Not on file  Tobacco Use  . Smoking status: Never Smoker  . Smokeless tobacco: Never Used  Substance and Sexual Activity  . Alcohol use: No  . Drug use: No  . Sexual activity: Not on file  Other Topics Concern  . Not on file  Social History Narrative  . Not on file    FAMILY HISTORY: No family history on file.  ALLERGIES:  has No Known Allergies.  MEDICATIONS:  Current Outpatient Medications  Medication Sig Dispense Refill  . acetaminophen (TYLENOL) 500 MG tablet Take 500 mg by mouth at bedtime.    . bisoprolol-hydrochlorothiazide (ZIAC) 5-6.25 MG tablet Take 1 tablet by mouth 2 (two) times daily.    Marland Kitchen ezetimibe-simvastatin (VYTORIN) 10-10 MG tablet Take 1 tablet by mouth at bedtime.    . Melatonin 3-10 MG TABS Take 0.5 tablets by mouth daily.    Marland Kitchen omega-3 acid ethyl esters (LOVAZA) 1 g capsule Take by mouth daily.    . Turmeric Curcumin 500 MG CAPS Take 500 mg by mouth daily. Contains 38m ginger powder     No current facility-administered medications for this visit.     REVIEW OF SYSTEMS:    10 Point review of Systems was done is negative except as noted above.  PHYSICAL EXAMINATION:  ECOG PERFORMANCE STATUS: 0 - Asymptomatic  . Vitals:   11/20/17 0841  BP: (!) 153/77  Pulse: 64  Resp: 20  Temp: 98.2 F (36.8 C)  SpO2: 98%   Filed Weights   11/20/17 0841  Weight: 134 lb (60.8 kg)   .Body mass index is 21.63 kg/m.  GENERAL:alert, in no acute distress and comfortable SKIN: no acute rashes, no significant lesions EYES: conjunctiva are pink and non-injected, sclera anicteric OROPHARYNX: MMM, no exudates, no oropharyngeal erythema or ulceration NECK: supple,  no JVD LYMPH:  no palpable lymphadenopathy in the cervical, axillary regions. Multiple enlarged left inguinal lymph node matted together largest about 4 cm in size. LUNGS: clear to auscultation b/l with normal respiratory effort HEART: regular rate & rhythm ABDOMEN:  normoactive bowel sounds , non tender, not distended. Extremity: no pedal edema PSYCH: alert & oriented x 3 with fluent speech NEURO: no focal motor/sensory deficits  LABORATORY DATA:  I have reviewed the data as listed  . CBC Latest Ref Rng & Units 11/08/2017 11/01/2017  WBC 4.0 - 10.5 K/uL 7.4 8.7  Hemoglobin 12.0 - 15.0 g/dL 14.4 -  Hematocrit 36.0 - 46.0 % 43.5 47.1(H)  Platelets 150 - 400 K/uL 183 196  hgb 15.1 . CBC    Component Value Date/Time   WBC 7.4 11/08/2017 1116   RBC 4.86 11/08/2017 1116   HGB 14.4 11/08/2017 1116   HCT 43.5 11/08/2017  1116   PLT 183 11/08/2017 1116   PLT 196 11/01/2017 1231   MCV 89.5 11/08/2017 1116   MCH 29.6 11/08/2017 1116   MCHC 33.1 11/08/2017 1116   RDW 13.5 11/08/2017 1116   LYMPHSABS 1.7 11/01/2017 1231   MONOABS 0.8 11/01/2017 1231   EOSABS 0.1 11/01/2017 1231   BASOSABS 0.1 11/01/2017 1231   . CMP Latest Ref Rng & Units 11/01/2017 10/26/2017  Glucose 70 - 140 mg/dL 99 -  BUN 7 - 26 mg/dL 17 -  Creatinine 0.44 - 1.00 mg/dL - 0.90  Sodium 136 - 145 mmol/L 143 -  Potassium 3.3 - 4.7 mmol/L 3.9 -  Chloride 98 - 109 mmol/L 104 -  CO2 22 - 29 mmol/L 29 -  Calcium 8.4 - 10.4 mg/dL 10.2 -  Total Protein 6.4 - 8.3 g/dL 7.7 -  Total Bilirubin 0.2 - 1.2 mg/dL 0.5 -  Alkaline Phos 40 - 150 U/L 83 -  AST 5 - 34 U/L 21 -  ALT 0 - 55 U/L 17 -   . Lab Results  Component Value Date   LDH 200 11/01/2017   Component     Latest Ref Rng & Units 11/01/2017  Retic Ct Pct     0.7 - 2.1 % 1.2  RBC.     3.70 - 5.45 MIL/uL 5.18  Retic Count, Absolute     33.7 - 90.7 K/uL 62.2  Hep B Core Ab, Tot     Negative Negative  Hepatitis B Surface Ag     Negative Negative  HCV Ab      0.0 - 0.9 s/co ratio <0.1  HIV Screen 4th Generation wRfx     Non Reactive Non Reactive  Sed Rate     0 - 22 mm/hr 1    PATHOLOGY  :       RADIOGRAPHIC STUDIES: I have personally reviewed the radiological images as listed and agreed with the findings in the report. Ct Abdomen Pelvis W Contrast  Result Date: 10/26/2017 CLINICAL DATA:  77 year old female with a history inguinal lymph nodes EXAM: CT ABDOMEN AND PELVIS WITH CONTRAST TECHNIQUE: Multidetector CT imaging of the abdomen and pelvis was performed using the standard protocol following bolus administration of intravenous contrast. CONTRAST:  192m ISOVUE-300 IOPAMIDOL (ISOVUE-300) INJECTION 61% COMPARISON:  Ultrasound inguinal region 10/19/2017 FINDINGS: Lower chest: No acute abnormality. Hepatobiliary: Unremarkable liver parenchyma. Unremarkable gallbladder which is relatively decompressed. No radiopaque stones. No inflammatory changes. Pancreas: Unremarkable pancreas Spleen: Unremarkable spleen Adrenals/Urinary Tract: Unremarkable adrenal glands. Right kidney demonstrates no hydronephrosis. No nephrolithiasis. Unremarkable appearance of the cortex perfusion. Unremarkable course of the right ureter, which does not appear obstructed. Left kidney unremarkable with no hydronephrosis. There may be a tiny 1 mm nonobstructive stone in the interpolar collecting system. No inflammatory changes. Unremarkable appearance of the cortex perfusion. Unremarkable course the left ureter which does not appear obstructed. Rounded cystic lesion on the lateral cortex measures 13 mm, likely simple cyst. Unremarkable appearance of the urinary bladder. Stomach/Bowel: Unremarkable appearance of stomach. Small hiatal hernia. Unremarkable small bowel. No abnormal distention. Normal appendix. Mild formed stool burden. No inflammatory changes adjacent to small bowel or colon. Multiple colonic diverticula without associated inflammatory changes. Vascular/Lymphatic:  Scattered calcifications of the abdominal aorta. No aneurysm or dissection flap. Calcifications of the bilateral iliac arteries which remain patent. Proximal femoral arteries remain patent. Multiple enlarged and borderline enlarged lymph nodes. Enlarged lymph nodes of the left inguinal region, as was demonstrated on the recent ultrasound survey.  Largest nodal mass measures 3.6 cm. There are multiple enlarged lymph nodes of the left iliac nodal stations, with the largest mass within the pelvis measuring 2.8 cm. There are multiple small/enlarging nodes of the right inguinal region and right iliac nodal station. Largest node on the right measures 1.4 cm. Multiple enlarged para-aortic and preaortic nodes. Largest adjacent to the aorta measures 2.6 cm on the right. No enlarged lymph nodes extending through the hiatus into the chest. No enlarged nodes of the visualized lower mediastinum. Enlarged lymph node at the base the mesentery measures 2.0 cm. No free fluid. Reproductive: Unremarkable appearance of uterus and the adnexa. Other: Small fat containing umbilical hernia Musculoskeletal: No acute fracture. Multilevel degenerative changes of the thoracolumbar spine. Schmorl's node at the superior endplate of O03. Vacuum disc phenomenon at L3-L4, L4-L5. Perineural cysts of the bilateral sacral foramina IMPRESSION: CT evidence of lymphoma, with bulky lymph nodes in multiple nodal stations of the abdomen and pelvis, predominantly left inguinal, left iliac, and the para-aortic nodes. Referral for oncologic evaluation recommended. 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 Aortic atherosclerosis.  Aortic Atherosclerosis (ICD10-I70.0). Electronically Signed   By: Corrie Mckusick D.O.   On: 10/26/2017 14:57   Nm Pet Image Initial (pi) Skull Base To Thigh  Result Date: 11/19/2017 CLINICAL DATA:  Initial treatment strategy for non-Hodgkin's B-cell lymphoma. EXAM: NUCLEAR MEDICINE PET SKULL BASE TO THIGH  TECHNIQUE: 6.5 mCi F-18 FDG was injected intravenously. Full-ring PET imaging was performed from the skull base to thigh after the radiotracer. CT data was obtained and used for attenuation correction and anatomic localization. FASTING BLOOD GLUCOSE:  Value: 106 mg/dl COMPARISON:  CT scan 10/26/2017 FINDINGS: NECK Left level Ib lymph node measures 6 mm in short axis on image 24/4 and has a maximum standard uptake value of 3.4 (Deauville 4). CHEST Left supraclavicular lymph node measures 0.7 cm in short axis on image 42/4 and has a maximum standard uptake value of 2.9 (Deauville 4). Left axillary lymph node measures 0.9 cm in short axis on image 54/4 and has a maximum SUV of 4.8 (Deauville 5). Subtle left hilar activity with maximum SUV 2.7 (Deauville 4). Background mediastinal blood pool activity: 1.7. Aortic arch and branch vessel atherosclerotic calcification noted with high position of the aortic arch and mild aortic arch ectasias. Descending thoracic aorta 3.6 cm just past the arch. Mild scarring or atelectasis in the lingula. 3 mm nodular density at the left lung base. ABDOMEN/PELVIS There is notable mesenteric, periaortic/retroperitoneal, bilateral common iliac, bilateral external iliac, and left greater than right inguinal adenopathy along with some faintly hypermetabolic porta hepatis lymph nodes. An index central upper mesenteric lymph node measures 2.0 cm in short axis on image 121/4 and has maximum SUV 12.1 (Deauville 5). An index left inguinal nodal mass measuring 3.3 cm in short axis has a maximum SUV of 7.6 (Deauville 5). Background liver uptake SUV: 2.2. No focal hypermetabolic lesions involving the liver, spleen, pancreas, or adrenal glands. No splenomegaly. 2 mm left kidney upper pole nonobstructive renal calculus. Aortoiliac atherosclerotic vascular disease. Sigmoid colon diverticulosis. SKELETON No focal hypermetabolic characteristic of skeletal metastasis. 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 Low-grade  activity along a right anterior healing fourth rib fracture. IMPRESSION: 1. Bulky retroperitoneal adenopathy with notable mesenteric, bilateral common iliac, bilateral external iliac, and left greater than right inguinal adenopathy, primarily Deauville 5. 2. Several small scattered lymph nodes (primarily Deauville 4) in the left neck and in the chest. However, a left axillary lymph node measures  in the dove L5 range. 3. No splenomegaly or abnormal splenic activity. 4. Other imaging findings of potential clinical significance: Aortic Atherosclerosis (ICD10-I70.0). 3 mm nodule in the left lower lobe. 2 mm left kidney upper pole nonobstructive renal calculus. Sigmoid colon diverticulosis. 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 Healing right anterior fourth rib fracture. Electronically Signed   By: Van Clines M.D.   On: 11/19/2017 13:37   [place] Core Biopsy (lymph Nodes)  Result Date: 11/08/2017 CLINICAL DATA:  Generalized lymphadenopathy.  No known primary. EXAM: ULTRASOUND GUIDED CORE BIOPSY OF LEFT INGUINAL ADENOPATHY MEDICATIONS: Intravenous Fentanyl and Versed were administered as conscious sedation during continuous monitoring of the patient's level of consciousness and physiological / cardiorespiratory status by the radiology RN, with a total moderate sedation time of 10 minutes. PROCEDURE: The procedure, risks, benefits, and alternatives were explained to the patient. Questions regarding the procedure were encouraged and answered. The patient understands and consents to the procedure. Survey ultrasound of the left inguinal region performed. The adenopathy was localized and an appropriate skin entry site was determined and marked. The operative field was prepped with chlorhexidine in a sterile fashion, and a sterile drape was applied covering the operative field. A sterile gown and sterile gloves were used for the procedure. Local anesthesia was provided with 1% Lidocaine. Under real-time ultrasound guidance, a 17  gauge trocar needle was advanced to the margin of the lesion. Once needle tip position was confirmed, coaxial 18-gauge core biopsy samples were obtained, submitted in saline to surgical pathology. The guide needle was removed. Postprocedure scans show no hemorrhage or other apparent complication. The patient tolerated the procedure well. COMPLICATIONS: None. FINDINGS: Left inguinal adenopathy with nodes measured up to a 3.8 cm diameter again identified. Representative core biopsy samples obtained as above. IMPRESSION: 1. Technically successful ultrasound-guided core biopsy, left inguinal adenopathy. Electronically Signed   By: Lucrezia Europe M.D.   On: 11/08/2017 13:32    ASSESSMENT & PLAN:  77 y.o. is a female with prior hx of HTN and high cholesterol    1. Large left inguinal lymphadenopathy  2. 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  Baseline LDH levels normal at 200 (11/01/17)    PLAN -Discussed with patient and her family regarding recent imaging studies and pathology findings available today.  -we discussed her new diagnosed of Non-hodgkins B-Cell lymphoma diagnosis in details. 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 Ki67 primary 20-30% with areas of 50%. Her biopsy sample was not enough to give a complete grade for her follicular lymphoma which would determine the treatment choice and approacj.  -We reviewed PET scan from 11/19/17 - with enlarged active lymph nodes primarily in her abdomen and small lymph nodes in her chest and neck, evidence of stage III disease.  -Baseline LDH levels were normal.  -Given the indeterminate grade on her pathology report, I recommended an excision LN biopsy by surgery. She is agreeable.  -she  has seen Dr Barry Dienes and shall be having an excision LN biopsy of her left inguinal LN -She is overall asymptomatic with no constitutional symptoms -Will get a baseline ECHO as her heart function will effect her treatment options.  -if Grade 3 FL would need a port and consideration of R-CHOP. -if low grade 1/2 FL - could potentially monitor her  vs BR  3. HTN -She is on HTN medication PLAN -Given her recent drop of BP I advised her to continue to closely monitor her BP at home and for her to contact her PCP   Urgent referral for [facility] for excisional LN biopsy ECHO in 1 weeks RTC with Dr Irene Limbo in 1 week after LN biopsy (about 2-3 weeks from now) with labs   All of the patients questions were answered with apparent satisfaction. The patient knows to call the clinic with any problems, questions or concerns.  I spent 30 minutes counseling the patient face to face. The total time spent in the appointment was 40 minutes and more than 50% was on counseling and direct patient cares and co-ordination of care with surgery    Sullivan Lone MD MS AAHIVMS [facility] Hematology/Oncology Physician [place]  (Office):       [phone] (Work cell):  [phone] (Fax):           [phone]  11/20/2017 8:45 AM   This document serves as a record of services personally performed by Sullivan Lone, MD. It was created on his behalf by Joslyn Devon, a trained medical scribe. The creation of this record is based on the scribe's personal observations and the provider's statements to them.   .I have reviewed the above documentation for accuracy and completeness, and I agree with the above. Brunetta Genera MD MS

## 2017-11-19 ENCOUNTER — Encounter (HOSPITAL_COMMUNITY)
Admission: RE | Admit: 2017-11-19 | Discharge: 2017-11-19 | Disposition: A | Discharge status: Home / Self Care | Payer: Medicare HMO | Source: Ambulatory Visit | Attending: Hematology | Admitting: Hematology

## 2017-11-19 DIAGNOSIS — C8598 Non-Hodgkin lymphoma, unspecified, lymph nodes of multiple sites: Secondary | ICD-10-CM | POA: Diagnosis present

## 2017-11-19 DIAGNOSIS — R591 Generalized enlarged lymph nodes: Secondary | ICD-10-CM | POA: Diagnosis not present

## 2017-11-19 DIAGNOSIS — C8590 Non-Hodgkin lymphoma, unspecified, unspecified site: Secondary | ICD-10-CM | POA: Diagnosis not present

## 2017-11-19 LAB — GLUCOSE, CAPILLARY: GLUCOSE-CAPILLARY: 106 mg/dL — AB (ref 65–99)

## 2017-11-19 MED ORDER — FLUDEOXYGLUCOSE F - 18 (FDG) INJECTION
6.4000 | Freq: Once | INTRAVENOUS | Status: AC | PRN
  Administered 2017-11-19: 6.4 via INTRAVENOUS

## 2017-11-20 ENCOUNTER — Encounter: Payer: Self-pay | Admitting: Hematology

## 2017-11-20 ENCOUNTER — Inpatient Hospital Stay: Payer: Medicare HMO | Attending: Hematology | Admitting: Hematology

## 2017-11-20 ENCOUNTER — Telehealth: Payer: Self-pay | Admitting: Hematology

## 2017-11-20 VITALS — BP 153/77 | HR 64 | Temp 98.2°F | Resp 20 | Ht 66.0 in | Wt 134.0 lb

## 2017-11-20 DIAGNOSIS — I10 Essential (primary) hypertension: Secondary | ICD-10-CM | POA: Diagnosis not present

## 2017-11-20 DIAGNOSIS — C8288 Other types of follicular lymphoma, lymph nodes of multiple sites: Secondary | ICD-10-CM

## 2017-11-20 DIAGNOSIS — R599 Enlarged lymph nodes, unspecified: Secondary | ICD-10-CM | POA: Diagnosis present

## 2017-11-20 DIAGNOSIS — C8598 Non-Hodgkin lymphoma, unspecified, lymph nodes of multiple sites: Secondary | ICD-10-CM

## 2017-11-20 DIAGNOSIS — C8285 Other types of follicular lymphoma, lymph nodes of inguinal region and lower limb: Secondary | ICD-10-CM

## 2017-11-20 NOTE — Telephone Encounter (Signed)
Scheduled appt per 2/5 los - Gave patient AVS and calender per los. Referral faxed to [facility]. Patient apt scheduled 4 weeks out - patient aware that they may need to call back and reschedule if biopsy is too close to f/u .

## 2017-11-20 NOTE — Patient Instructions (Signed)
Thank you for choosing [facility] to provide your oncology and hematology care.  To afford each patient quality time with our providers, please arrive 30 minutes before your scheduled appointment time.  If you arrive late for your appointment, you may be asked to reschedule.  We strive to give you quality time with our providers, and arriving late affects you and other patients whose appointments are after yours.   If you are a no show for multiple scheduled visits, you may be dismissed from the clinic at the providers discretion.    Again, thank you for choosing [facility], our hope is that these requests will decrease the amount of time that you wait before being seen by our physicians.  ______________________________________________________________________  Should you have questions after your visit to the [facility], please contact our office at [phone] between the hours of 8:30 and 4:30 p.m.    Voicemails left after 4:30p.m will not be returned until the following business day.    For prescription refill requests, please have your pharmacy contact us directly.  Please also try to allow 48 hours for prescription requests.    Please contact the scheduling department for questions regarding scheduling.  For scheduling of procedures such as PET scans, CT scans, MRI, Ultrasound, etc please contact central scheduling at [phone].    Resources For Cancer Patients and Caregivers:   Oncolink.org:  A wonderful resource for patients and healthcare providers for information regarding your disease, ways to tract your treatment, what to expect, etc.     American Cancer Society:  800-227-2345  Can help patients locate various types of support and financial assistance  Cancer Care: 1-800-813-HOPE (4673) Provides financial assistance, online support groups, medication/co-pay assistance.    [place] DSS:  336-641-3447 Where to apply for food  stamps, Medicaid, and utility assistance  Medicare Rights Center: 800-333-4114 Helps people with Medicare understand their rights and benefits, navigate the Medicare system, and secure the quality healthcare they deserve  SCAT: 336-333-6589 [place] [employer]'s shared-ride transportation service for eligible riders who have a disability that prevents them from riding the fixed route bus.    For additional information on assistance programs please contact our social worker:   Grier Hock/Abigail Elmore:  [phone]            

## 2017-11-22 ENCOUNTER — Ambulatory Visit (HOSPITAL_COMMUNITY)
Admission: RE | Admit: 2017-11-22 | Discharge: 2017-11-22 | Disposition: A | Discharge status: Home / Self Care | Payer: Medicare HMO | Source: Ambulatory Visit | Attending: Hematology | Admitting: Hematology

## 2017-11-22 DIAGNOSIS — C8598 Non-Hodgkin lymphoma, unspecified, lymph nodes of multiple sites: Secondary | ICD-10-CM | POA: Insufficient documentation

## 2017-11-22 DIAGNOSIS — I082 Rheumatic disorders of both aortic and tricuspid valves: Secondary | ICD-10-CM | POA: Insufficient documentation

## 2017-11-22 NOTE — Progress Notes (Signed)
  Echocardiogram 2D Echocardiogram has been performed.  Alicia [name] [name] 11/22/2017, 10:45 AM

## 2017-11-23 ENCOUNTER — Other Ambulatory Visit: Payer: Self-pay | Admitting: General Surgery

## 2017-11-23 DIAGNOSIS — C8595 Non-Hodgkin lymphoma, unspecified, lymph nodes of inguinal region and lower limb: Secondary | ICD-10-CM | POA: Diagnosis not present

## 2017-11-24 ENCOUNTER — Encounter (HOSPITAL_COMMUNITY): Payer: Self-pay | Admitting: *Deleted

## 2017-11-24 NOTE — Progress Notes (Signed)
Patient denies chest pain or shortness of breath. Have requested co- workers follow- up Monday on Consent order as patient reports that she is also having a port a cath placed which is not listed as part of the informed consent.

## 2017-11-26 NOTE — Progress Notes (Signed)
As requested by nurse that completed SDW-pre-op call, spoke with Hassan Rowan at [facility] to make MD aware that case is booked for port placement as well as lymph node biopsy and port placement is not indicated on the order for consent.

## 2017-11-26 NOTE — H&P (View-Only) (Signed)
[name] Documented: 11/23/2017 8:50 AM Location: [facility] Patient #: [id number] DOB: 1941-03-29 Widowed / Language: Cleophus Molt / Race: White Female   History of Present Illness Stark Klein MD; 11/23/2017 9:37 AM) The patient is a 77 year old female who presents with lymphadenopathy. Patient is a lovely 77 year old female who presented with a palpable mass in her left groin. A CT scan confirmed this with bulky nodes in the retroperitoneum in the left inguinal left iliac regions. She rarely goes to the doctor other than refills on her cholesterol and blood pressure medications. She denies pain, night sweats, or weight loss. 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 She says this drives her children crazy but she is getting do what she wants. She is a retired Regulatory affairs officer. She denies alcohol or tobacco use. Her family history includes a father with renal cancer and mother with lung cancer. She has 2 siblings who have had renal disease. 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 We are asked to see the patient for excisional lymph node biopsy for treatment planning.  PET 11/19/2017 IMPRESSION: 1. Bulky retroperitoneal adenopathy with notable mesenteric, bilateral common iliac, bilateral external iliac, and left greater than right inguinal adenopathy, primarily Deauville 5. 2. Several small scattered lymph nodes (primarily Deauville 4) in the left neck and in the chest. However, a left axillary lymph node measures in the dove L5 range. 3. No splenomegaly or abnormal splenic activity. 4. Other imaging findings of potential clinical significance: Aortic Atherosclerosis (ICD10-I70.0). 3 mm nodule in the left lower lobe. 2 mm left kidney upper pole nonobstructive renal calculus. Sigmoid colon diverticulosis. 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 Healing  right anterior fourth rib fracture.  CT abd/pelvic 10/26/2017 IMPRESSION: CT evidence of lymphoma, with bulky lymph nodes in multiple nodal stations of the abdomen and pelvis, predominantly left inguinal, left iliac, and the para-aortic nodes. Referral for oncologic evaluation recommended.  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  Aortic atherosclerosis. Aortic Atherosclerosis (ICD10-I70.0).   Path 11/08/2017 Diagnosis Lymph node, needle/core biopsy, left inguinal - NON-HODGKIN'S B CELL LYMPHOMA. - SEE ONCOLOGY. Microscopic Comment LYMPHOMA Histologic type: Non-Hodgkin's lymphoma, follicle center cell type. Grade (if applicable): See comment. Flow cytometry: Monoclonal, kappa restricted B cell population expressing pan B cell antigens including CD20 associated with CD10 expression ([id number]). Immunohistochemical stains: CD20, CD79a, CD10, CD21, CD3, CD5, BCL-2, BCL-6, cyclin-D1, and Ki-67 with appropriate controls. Touch preps/imprints: Not performed. Comments: The sections show multiple needle core biopsy fragments of lymph nodal tissue displaying numerous variably sized and ill defined atypical lymphoid follicles characterized by absent or attenuated mantle zones, lack of polarity and a homogenous composition of lymphoid cells displaying a mixture of small and larger lymphocytes. The latter consists of large cleaved and occasional non cleaved cells. The atypical follicles show areas of possible early coalescence. Flow cytometric analysis shows a monoclonal, kappa restricted B cell population expressing CD10. In addition, immunohistochemical stains were performed and show that there is predominance of B cells throughout although the staining is more accentuated in the follicular areas. This is associated with BCL-2, BCL-6 and CD10 expression. There is an admixed T cell population in the background as seen with CD3 and CD5 and there is no apparent  coexpression of CD5 in B cell areas. CD21 highlights numerous dendritic networks throughout the biopsies. No significant staining is seen with cyclin-D1.  Ki-67 generally shows 10 to 20% positivity. In small foci, there is prominently increased positivity (more than 50%). However, in these latter areas the BCL-2 positivity is lacking, suggesting that there are residual benign follicles. 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 While grading is somewhat challenging in these small biopsy fragments, the morphologic features as well as the Ki-67 pattern raise the possibility of a high grade process. Excisional biopsy may be helpful in this regard. Clinical correlation is strongly recommended. (BNS:gt, 11/12/17) 1 of  Flow cytometry 11/09/2017 Interpretation Tissue-Flow Cytometry - MONOCLONAL B CELL POPULATION IDENTIFIED. - SEE COMMENT. 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 (BNS:kh 11-12-17)   Past Surgical History (Tanisha A. Owens Shark, [place]; 11/23/2017 8:50 AM) Tonsillectomy   Diagnostic Studies History (Tanisha A. Owens Shark, [place]; 11/23/2017 8:50 AM) Mammogram  1-3 years ago Pap Smear  >5 years ago  Allergies (Tanisha A. Owens Shark, [place]; 11/23/2017 8:52 AM) No Known Drug Allergies [11/23/2017]: Allergies Reconciled   Medication History (Tanisha A. Owens Shark, RMA; 11/23/2017 8:54 AM) Simvastatin ('10MG'$  Tablet, Oral) Active. Tylenol ('500MG'$  Capsule, Oral) Active. Melatonin (2.'5MG'$  Capsule, Oral) Active. Omega 3 (Oral) Specific strength unknown - Active. Medications Reconciled  Social History (Tanisha A. Owens Shark, [place]; 11/23/2017 8:50 AM) Caffeine use  Coffee. No alcohol use  No drug use  Tobacco use  Never smoker.  Family History (Tanisha A. Owens Shark, [place]; 11/23/2017 8:50 AM) Alcohol Abuse  Brother. Breast Cancer  Mother. Cancer  Father, Mother. Depression  Daughter.  Pregnancy / Birth History  (Tanisha A. Owens Shark, [place]; 11/23/2017 8:50 AM) Age at menarche  17 years. Age of menopause  11-60 Gravida  2 Maternal age  90-20 Para  2  Other Problems (Tanisha A. Owens Shark, [place]; 11/23/2017 8:50 AM) Back Pain  Cancer  High blood pressure  Hypercholesterolemia     Review of Systems (Tanisha A. Brown RMA; 11/23/2017 8:50 AM) General Not Present- Appetite Loss, Chills, Fatigue, Fever, Night Sweats, Weight Gain and Weight Loss. Skin Not Present- Change in Wart/Mole, Dryness, Hives, Jaundice, New Lesions, Non-Healing Wounds, Rash and Ulcer. HEENT Present- Hearing Loss, Ringing in the Ears and Wears glasses/contact lenses. Not Present- Earache, Hoarseness, Nose Bleed, Oral Ulcers, Seasonal Allergies, Sinus Pain, Sore Throat, Visual Disturbances and Yellow Eyes. Respiratory Not Present- Bloody sputum, Chronic Cough, Difficulty Breathing, Snoring and Wheezing. Breast Not Present- Breast Mass, Breast Pain, Nipple Discharge and Skin Changes. Cardiovascular Not Present- Chest Pain, Difficulty Breathing Lying Down, Leg Cramps, Palpitations, Rapid Heart Rate, Shortness of Breath and Swelling of Extremities. Gastrointestinal Not Present- Abdominal Pain, Bloating, Bloody Stool, Change in Bowel Habits, Chronic diarrhea, Constipation, Difficulty Swallowing, Excessive gas, Gets full quickly at meals, Hemorrhoids, Indigestion, Nausea, Rectal Pain and Vomiting. Female Genitourinary Not Present- Frequency, Nocturia, Painful Urination, Pelvic Pain and Urgency. Musculoskeletal Present- Joint Pain. Not Present- Back Pain, Joint Stiffness, Muscle Pain, Muscle Weakness and Swelling of Extremities. Neurological Not Present- Decreased Memory, Fainting, Headaches, Numbness, Seizures, Tingling, Tremor, Trouble walking and Weakness. Psychiatric Not Present- Anxiety, Bipolar, Change in Sleep Pattern, Depression, Fearful and Frequent crying. Endocrine Not Present- Cold Intolerance, Excessive Hunger, Hair Changes, Heat  Intolerance, Hot flashes and New Diabetes. Hematology Not Present- Blood Thinners, Easy Bruising, Excessive bleeding, Gland problems, HIV and Persistent Infections.  Vitals (Tanisha A. Brown RMA; 11/23/2017 8:52 AM) 11/23/2017 8:51 AM Weight: 134.2 lb Height: 66in Body Surface Area: 1.69 m Body Mass Index: 21.66 kg/m  Temp.: 98.24F  Pulse: 80 (Regular)  BP: 140/78 (Sitting, Left Arm,  Standard)       Physical Exam Stark Klein MD; 11/23/2017 9:37 AM) General Mental Status-Alert. General Appearance-Consistent with stated age. Hydration-Well hydrated. Voice-Normal.  Head and Neck Head-normocephalic, atraumatic with no lesions or palpable masses. Trachea-midline. Thyroid Gland Characteristics - normal size and consistency.  Eye Eyeball - Bilateral-Extraocular movements intact. Sclera/Conjunctiva - Bilateral-No scleral icterus.  Chest and Lung Exam Chest and lung exam reveals -quiet, even and easy respiratory effort with no use of accessory muscles and on auscultation, normal breath sounds, no adventitious sounds and normal vocal resonance. Inspection Chest Wall - Normal. Back - normal.  Cardiovascular Cardiovascular examination reveals -normal heart sounds, regular rate and rhythm with no murmurs and normal pedal pulses bilaterally.  Abdomen Inspection Inspection of the abdomen reveals - No Hernias. Palpation/Percussion Palpation and Percussion of the abdomen reveal - Soft, Non Tender, No Rebound tenderness, No Rigidity (guarding) and No hepatosplenomegaly. Auscultation Auscultation of the abdomen reveals - Bowel sounds normal.  Neurologic Neurologic evaluation reveals -alert and oriented x 3 with no impairment of recent or remote memory. Mental Status-Normal.  Musculoskeletal Global Assessment -Note: no gross deformities.  Normal Exam - Left-Upper Extremity Strength Normal and Lower Extremity Strength Normal. Normal Exam -  Right-Upper Extremity Strength Normal and Lower Extremity Strength Normal.  Lymphatic Head & Neck  General Head & Neck Lymphatics: Bilateral - Description - Normal. Axillary  General Axillary Region: Bilateral - Description - Normal. Tenderness - Non Tender. Femoral & Inguinal  Generalized Femoral & Inguinal Lymphatics: Bilateral - Description - Note: Bilateral inguinal adenopathy, L >>> R.    Assessment & Plan Stark Klein MD; 11/23/2017 9:39 AM) Jessica Priest LYMPHOMA OF LYMPH NODES OF INGUINAL REGION (C85.95) Impression: We will plan to do an excisional lymph node biopsy on the patient. I do think that the left groin will be the best place to excise in node. There are no palpable axillary nodes. The right side has enlarged nodes, but they are much more subtle than the left. I discussed with the patient the procedure for having surgery, n.p.o. length of time, risks including bleeding, infection, fluid buildup, lymphedema, heart or lung issues, blood clot. We will do this next week in order to expedite her treatment planning. I will communicate with Dr. Irene Limbo regarding whether or not the patient needs a port. It sounds like from his discussions with the patient, that she might not receive chemo depending on the grade of the tumor. We will send her to scheduling today. Current Plans Schedule for Surgery Pt Education - [facility] General Post-op [facility]   Signed by Stark Klein, MD (11/23/2017 9:39 AM)

## 2017-11-26 NOTE — H&P (Addendum)
Alicia [name] Documented: 11/23/2017 8:50 AM Location: [facility] Patient #: [id number] DOB: 10-07-1941 Widowed / Language: Cleophus Molt / Race: White Female   History of Present Illness Stark Klein MD; 11/23/2017 9:37 AM) The patient is a 77 year old female who presents with lymphadenopathy. Patient is a lovely 77 year old female who presented with a palpable mass in her left groin. A CT scan confirmed this with bulky nodes in the retroperitoneum in the left inguinal left iliac regions. She rarely goes to the doctor other than refills on her cholesterol and blood pressure medications. She denies pain, night sweats, or weight loss. 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 She says this drives her children crazy but she is getting do what she wants. She is a retired Regulatory affairs officer. She denies alcohol or tobacco use. Her family history includes a father with renal cancer and mother with lung cancer. She has 2 siblings who have had renal disease. 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 We are asked to see the patient for excisional lymph node biopsy for treatment planning.  PET 11/19/2017 IMPRESSION: 1. Bulky retroperitoneal adenopathy with notable mesenteric, bilateral common iliac, bilateral external iliac, and left greater than right inguinal adenopathy, primarily Deauville 5. 2. Several small scattered lymph nodes (primarily Deauville 4) in the left neck and in the chest. However, a left axillary lymph node measures in the dove L5 range. 3. No splenomegaly or abnormal splenic activity. 4. Other imaging findings of potential clinical significance: Aortic Atherosclerosis (ICD10-I70.0). 3 mm nodule in the left lower lobe. 2 mm left kidney upper pole nonobstructive renal calculus. Sigmoid colon diverticulosis. 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 Healing  right anterior fourth rib fracture.  CT abd/pelvic 10/26/2017 IMPRESSION: CT evidence of lymphoma, with bulky lymph nodes in multiple nodal stations of the abdomen and pelvis, predominantly left inguinal, left iliac, and the para-aortic nodes. Referral for oncologic evaluation recommended.  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  Aortic atherosclerosis. Aortic Atherosclerosis (ICD10-I70.0).   Path 11/08/2017 Diagnosis Lymph node, needle/core biopsy, left inguinal - NON-HODGKIN'S B CELL LYMPHOMA. - SEE ONCOLOGY. Microscopic Comment LYMPHOMA Histologic type: Non-Hodgkin's lymphoma, follicle center cell type. Grade (if applicable): See comment. Flow cytometry: Monoclonal, kappa restricted B cell population expressing pan B cell antigens including CD20 associated with CD10 expression ([id number]). Immunohistochemical stains: CD20, CD79a, CD10, CD21, CD3, CD5, BCL-2, BCL-6, cyclin-D1, and Ki-67 with appropriate controls. Touch preps/imprints: Not performed. Comments: The sections show multiple needle core biopsy fragments of lymph nodal tissue displaying numerous variably sized and ill defined atypical lymphoid follicles characterized by absent or attenuated mantle zones, lack of polarity and a homogenous composition of lymphoid cells displaying a mixture of small and larger lymphocytes. The latter consists of large cleaved and occasional non cleaved cells. The atypical follicles show areas of possible early coalescence. Flow cytometric analysis shows a monoclonal, kappa restricted B cell population expressing CD10. In addition, immunohistochemical stains were performed and show that there is predominance of B cells throughout although the staining is more accentuated in the follicular areas. This is associated with BCL-2, BCL-6 and CD10 expression. There is an admixed T cell population in the background as seen with CD3 and CD5 and there is no apparent  coexpression of CD5 in B cell areas. CD21 highlights numerous dendritic networks throughout the biopsies. No significant staining is seen with cyclin-D1.  Ki-67 generally shows 10 to 20% positivity. In small foci, there is prominently increased positivity (more than 50%). However, in these latter areas the BCL-2 positivity is lacking, suggesting that there are residual benign follicles. 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 While grading is somewhat challenging in these small biopsy fragments, the morphologic features as well as the Ki-67 pattern raise the possibility of a high grade process. Excisional biopsy may be helpful in this regard. Clinical correlation is strongly recommended. (BNS:gt, 11/12/17) 1 of  Flow cytometry 11/09/2017 Interpretation Tissue-Flow Cytometry - MONOCLONAL B CELL POPULATION IDENTIFIED. - SEE COMMENT. 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 (BNS:kh 11-12-17)   Past Surgical History (Tanisha A. Owens Shark, [place]; 11/23/2017 8:50 AM) Tonsillectomy   Diagnostic Studies History (Tanisha A. Owens Shark, [place]; 11/23/2017 8:50 AM) Mammogram  1-3 years ago Pap Smear  >5 years ago  Allergies (Tanisha A. Owens Shark, [place]; 11/23/2017 8:52 AM) No Known Drug Allergies [11/23/2017]: Allergies Reconciled   Medication History (Tanisha A. Owens Shark, RMA; 11/23/2017 8:54 AM) Simvastatin ('10MG'$  Tablet, Oral) Active. Tylenol ('500MG'$  Capsule, Oral) Active. Melatonin (2.'5MG'$  Capsule, Oral) Active. Omega 3 (Oral) Specific strength unknown - Active. Medications Reconciled  Social History (Tanisha A. Owens Shark, [place]; 11/23/2017 8:50 AM) Caffeine use  Coffee. No alcohol use  No drug use  Tobacco use  Never smoker.  Family History (Tanisha A. Owens Shark, [place]; 11/23/2017 8:50 AM) Alcohol Abuse  Brother. Breast Cancer  Mother. Cancer  Father, Mother. Depression  Daughter.  Pregnancy / Birth History  (Tanisha A. Owens Shark, [place]; 11/23/2017 8:50 AM) Age at menarche  20 years. Age of menopause  86-60 Gravida  2 Maternal age  41-20 Para  2  Other Problems (Tanisha A. Owens Shark, [place]; 11/23/2017 8:50 AM) Back Pain  Cancer  High blood pressure  Hypercholesterolemia     Review of Systems (Tanisha A. Brown RMA; 11/23/2017 8:50 AM) General Not Present- Appetite Loss, Chills, Fatigue, Fever, Night Sweats, Weight Gain and Weight Loss. Skin Not Present- Change in Wart/Mole, Dryness, Hives, Jaundice, New Lesions, Non-Healing Wounds, Rash and Ulcer. HEENT Present- Hearing Loss, Ringing in the Ears and Wears glasses/contact lenses. Not Present- Earache, Hoarseness, Nose Bleed, Oral Ulcers, Seasonal Allergies, Sinus Pain, Sore Throat, Visual Disturbances and Yellow Eyes. Respiratory Not Present- Bloody sputum, Chronic Cough, Difficulty Breathing, Snoring and Wheezing. Breast Not Present- Breast Mass, Breast Pain, Nipple Discharge and Skin Changes. Cardiovascular Not Present- Chest Pain, Difficulty Breathing Lying Down, Leg Cramps, Palpitations, Rapid Heart Rate, Shortness of Breath and Swelling of Extremities. Gastrointestinal Not Present- Abdominal Pain, Bloating, Bloody Stool, Change in Bowel Habits, Chronic diarrhea, Constipation, Difficulty Swallowing, Excessive gas, Gets full quickly at meals, Hemorrhoids, Indigestion, Nausea, Rectal Pain and Vomiting. Female Genitourinary Not Present- Frequency, Nocturia, Painful Urination, Pelvic Pain and Urgency. Musculoskeletal Present- Joint Pain. Not Present- Back Pain, Joint Stiffness, Muscle Pain, Muscle Weakness and Swelling of Extremities. Neurological Not Present- Decreased Memory, Fainting, Headaches, Numbness, Seizures, Tingling, Tremor, Trouble walking and Weakness. Psychiatric Not Present- Anxiety, Bipolar, Change in Sleep Pattern, Depression, Fearful and Frequent crying. Endocrine Not Present- Cold Intolerance, Excessive Hunger, Hair Changes, Heat  Intolerance, Hot flashes and New Diabetes. Hematology Not Present- Blood Thinners, Easy Bruising, Excessive bleeding, Gland problems, HIV and Persistent Infections.  Vitals (Tanisha A. Brown RMA; 11/23/2017 8:52 AM) 11/23/2017 8:51 AM Weight: 134.2 lb Height: 66in Body Surface Area: 1.69 m Body Mass Index: 21.66 kg/m  Temp.: 98.22F  Pulse: 80 (Regular)  BP: 140/78 (Sitting, Left Arm,  Standard)       Physical Exam Stark Klein MD; 11/23/2017 9:37 AM) General Mental Status-Alert. General Appearance-Consistent with stated age. Hydration-Well hydrated. Voice-Normal.  Head and Neck Head-normocephalic, atraumatic with no lesions or palpable masses. Trachea-midline. Thyroid Gland Characteristics - normal size and consistency.  Eye Eyeball - Bilateral-Extraocular movements intact. Sclera/Conjunctiva - Bilateral-No scleral icterus.  Chest and Lung Exam Chest and lung exam reveals -quiet, even and easy respiratory effort with no use of accessory muscles and on auscultation, normal breath sounds, no adventitious sounds and normal vocal resonance. Inspection Chest Wall - Normal. Back - normal.  Cardiovascular Cardiovascular examination reveals -normal heart sounds, regular rate and rhythm with no murmurs and normal pedal pulses bilaterally.  Abdomen Inspection Inspection of the abdomen reveals - No Hernias. Palpation/Percussion Palpation and Percussion of the abdomen reveal - Soft, Non Tender, No Rebound tenderness, No Rigidity (guarding) and No hepatosplenomegaly. Auscultation Auscultation of the abdomen reveals - Bowel sounds normal.  Neurologic Neurologic evaluation reveals -alert and oriented x 3 with no impairment of recent or remote memory. Mental Status-Normal.  Musculoskeletal Global Assessment -Note: no gross deformities.  Normal Exam - Left-Upper Extremity Strength Normal and Lower Extremity Strength Normal. Normal Exam -  Right-Upper Extremity Strength Normal and Lower Extremity Strength Normal.  Lymphatic Head & Neck  General Head & Neck Lymphatics: Bilateral - Description - Normal. Axillary  General Axillary Region: Bilateral - Description - Normal. Tenderness - Non Tender. Femoral & Inguinal  Generalized Femoral & Inguinal Lymphatics: Bilateral - Description - Note: Bilateral inguinal adenopathy, L >>> R.    Assessment & Plan Stark Klein MD; 11/23/2017 9:39 AM) Alicia [name] LYMPHOMA OF LYMPH NODES OF INGUINAL REGION (C85.95) Impression: We will plan to do an excisional lymph node biopsy on the patient. I do think that the left groin will be the best place to excise in node. There are no palpable axillary nodes. The right side has enlarged nodes, but they are much more subtle than the left. I discussed with the patient the procedure for having surgery, n.p.o. length of time, risks including bleeding, infection, fluid buildup, lymphedema, heart or lung issues, blood clot. We will do this next week in order to expedite her treatment planning. I will communicate with Dr. Irene Limbo regarding whether or not the patient needs a port. It sounds like from his discussions with the patient, that she might not receive chemo depending on the grade of the tumor. We will send her to scheduling today. Current Plans Schedule for Surgery Pt Education - [facility] General Post-op [facility]   Signed by Stark Klein, MD (11/23/2017 9:39 AM)

## 2017-11-27 ENCOUNTER — Ambulatory Visit (HOSPITAL_COMMUNITY): Payer: Medicare HMO | Admitting: Certified Registered Nurse Anesthetist

## 2017-11-27 ENCOUNTER — Ambulatory Visit (HOSPITAL_COMMUNITY)
Admission: RE | Admit: 2017-11-27 | Discharge: 2017-11-27 | Disposition: A | Discharge status: Home / Self Care | Payer: Medicare HMO | Source: Ambulatory Visit | Attending: General Surgery | Admitting: General Surgery

## 2017-11-27 ENCOUNTER — Encounter (HOSPITAL_COMMUNITY): Payer: Self-pay | Admitting: *Deleted

## 2017-11-27 ENCOUNTER — Encounter (HOSPITAL_COMMUNITY)
Admission: RE | Disposition: A | Discharge status: Home / Self Care | Payer: Self-pay | Source: Ambulatory Visit | Attending: General Surgery

## 2017-11-27 DIAGNOSIS — C8235 Follicular lymphoma grade IIIa, lymph nodes of inguinal region and lower limb: Secondary | ICD-10-CM | POA: Insufficient documentation

## 2017-11-27 DIAGNOSIS — I700 Atherosclerosis of aorta: Secondary | ICD-10-CM | POA: Insufficient documentation

## 2017-11-27 DIAGNOSIS — N200 Calculus of kidney: Secondary | ICD-10-CM | POA: Insufficient documentation

## 2017-11-27 DIAGNOSIS — E7800 Pure hypercholesterolemia, unspecified: Secondary | ICD-10-CM | POA: Diagnosis not present

## 2017-11-27 DIAGNOSIS — I10 Essential (primary) hypertension: Secondary | ICD-10-CM | POA: Insufficient documentation

## 2017-11-27 DIAGNOSIS — K5730 Diverticulosis of large intestine without perforation or abscess without bleeding: Secondary | ICD-10-CM | POA: Diagnosis not present

## 2017-11-27 DIAGNOSIS — C8225 Follicular lymphoma grade III, unspecified, lymph nodes of inguinal region and lower limb: Secondary | ICD-10-CM | POA: Diagnosis not present

## 2017-11-27 DIAGNOSIS — Z79899 Other long term (current) drug therapy: Secondary | ICD-10-CM | POA: Diagnosis not present

## 2017-11-27 DIAGNOSIS — C8595 Non-Hodgkin lymphoma, unspecified, lymph nodes of inguinal region and lower limb: Secondary | ICD-10-CM | POA: Diagnosis not present

## 2017-11-27 HISTORY — DX: Non-Hodgkin lymphoma, unspecified, unspecified site: C85.90

## 2017-11-27 HISTORY — PX: LYMPH NODE BIOPSY: SHX201

## 2017-11-27 LAB — CBC
HEMATOCRIT: 43.5 % (ref 36.0–46.0)
HEMOGLOBIN: 14.2 g/dL (ref 12.0–15.0)
MCH: 29.5 pg (ref 26.0–34.0)
MCHC: 32.6 g/dL (ref 30.0–36.0)
MCV: 90.4 fL (ref 78.0–100.0)
Platelets: 160 10*3/uL (ref 150–400)
RBC: 4.81 MIL/uL (ref 3.87–5.11)
RDW: 13.5 % (ref 11.5–15.5)
WBC: 6.4 10*3/uL (ref 4.0–10.5)

## 2017-11-27 LAB — BASIC METABOLIC PANEL
Anion gap: 11 (ref 5–15)
BUN: 16 mg/dL (ref 6–20)
CHLORIDE: 109 mmol/L (ref 101–111)
CO2: 25 mmol/L (ref 22–32)
CREATININE: 0.94 mg/dL (ref 0.44–1.00)
Calcium: 9.5 mg/dL (ref 8.9–10.3)
GFR calc Af Amer: >60 mL/min (ref >60)
GFR calc non Af Amer: 57 mL/min — ABNORMAL LOW (ref >60)
Glucose, Bld: 107 mg/dL — ABNORMAL HIGH (ref 65–99)
Potassium: 3.8 mmol/L (ref 3.5–5.1)
Sodium: 145 mmol/L (ref 135–145)

## 2017-11-27 SURGERY — LYMPH NODE BIOPSY
Anesthesia: General | Laterality: Left

## 2017-11-27 MED ORDER — LIDOCAINE 2% (20 MG/ML) 5 ML SYRINGE
INTRAMUSCULAR | Status: DC | PRN
  Administered 2017-11-27: 50 mg via INTRAVENOUS

## 2017-11-27 MED ORDER — OXYCODONE HCL 5 MG PO TABS: 5.0000 mg | ORAL_TABLET | Freq: Four times a day (QID) | ORAL | 0 refills | Status: DC | PRN

## 2017-11-27 MED ORDER — 0.9 % SODIUM CHLORIDE (POUR BTL) OPTIME
TOPICAL | Status: DC | PRN
  Administered 2017-11-27: 1000 mL

## 2017-11-27 MED ORDER — LIDOCAINE-EPINEPHRINE (PF) 1 %-1:200000 IJ SOLN
INTRAMUSCULAR | Status: AC
  Filled 2017-11-27: qty 30

## 2017-11-27 MED ORDER — GABAPENTIN 300 MG PO CAPS
ORAL_CAPSULE | ORAL | Status: AC
  Administered 2017-11-27: 300 mg via ORAL
  Filled 2017-11-27: qty 1

## 2017-11-27 MED ORDER — ONDANSETRON HCL 4 MG/2ML IJ SOLN
INTRAMUSCULAR | Status: AC
  Filled 2017-11-27: qty 4

## 2017-11-27 MED ORDER — GLYCOPYRROLATE 0.2 MG/ML IJ SOLN
INTRAMUSCULAR | Status: DC | PRN
  Administered 2017-11-27: 0.2 mg via INTRAVENOUS

## 2017-11-27 MED ORDER — GABAPENTIN 300 MG PO CAPS
300.0000 mg | ORAL_CAPSULE | ORAL | Status: AC
  Administered 2017-11-27: 300 mg via ORAL

## 2017-11-27 MED ORDER — CEFAZOLIN SODIUM-DEXTROSE 2-4 GM/100ML-% IV SOLN
INTRAVENOUS | Status: AC
  Filled 2017-11-27: qty 100

## 2017-11-27 MED ORDER — PHENYLEPHRINE HCL 10 MG/ML IJ SOLN
INTRAMUSCULAR | Status: DC | PRN
  Administered 2017-11-27: 80 ug via INTRAVENOUS
  Administered 2017-11-27: 40 ug via INTRAVENOUS
  Administered 2017-11-27: 80 ug via INTRAVENOUS

## 2017-11-27 MED ORDER — LACTATED RINGERS IV SOLN
INTRAVENOUS | Status: DC
  Administered 2017-11-27: 10:00:00 via INTRAVENOUS

## 2017-11-27 MED ORDER — BUPIVACAINE-EPINEPHRINE 0.5% -1:200000 IJ SOLN
INTRAMUSCULAR | Status: DC | PRN
  Administered 2017-11-27: 10 mL

## 2017-11-27 MED ORDER — CHLORHEXIDINE GLUCONATE CLOTH 2 % EX PADS: 6.0000 | MEDICATED_PAD | Freq: Once | CUTANEOUS | Status: DC

## 2017-11-27 MED ORDER — PROPOFOL 500 MG/50ML IV EMUL
INTRAVENOUS | Status: DC | PRN
  Administered 2017-11-27: 50 ug/kg/min via INTRAVENOUS

## 2017-11-27 MED ORDER — LIDOCAINE HCL (PF) 1 % IJ SOLN
INTRAMUSCULAR | Status: DC | PRN
  Administered 2017-11-27: 10 mL

## 2017-11-27 MED ORDER — EPHEDRINE SULFATE 50 MG/ML IJ SOLN
INTRAMUSCULAR | Status: DC | PRN
  Administered 2017-11-27: 5 mg via INTRAVENOUS

## 2017-11-27 MED ORDER — FENTANYL CITRATE (PF) 100 MCG/2ML IJ SOLN: 25.0000 ug | INTRAMUSCULAR | Status: DC | PRN

## 2017-11-27 MED ORDER — PROPOFOL 1000 MG/100ML IV EMUL
INTRAVENOUS | Status: AC
  Filled 2017-11-27: qty 100

## 2017-11-27 MED ORDER — EPHEDRINE 5 MG/ML INJ
INTRAVENOUS | Status: AC
  Filled 2017-11-27: qty 10

## 2017-11-27 MED ORDER — PROPOFOL 10 MG/ML IV BOLUS
INTRAVENOUS | Status: DC | PRN
  Administered 2017-11-27: 120 mg via INTRAVENOUS

## 2017-11-27 MED ORDER — LIDOCAINE 2% (20 MG/ML) 5 ML SYRINGE
INTRAMUSCULAR | Status: AC
  Filled 2017-11-27: qty 5

## 2017-11-27 MED ORDER — LIDOCAINE HCL 1 % IJ SOLN
INTRAMUSCULAR | Status: AC
  Filled 2017-11-27: qty 20

## 2017-11-27 MED ORDER — DEXAMETHASONE SODIUM PHOSPHATE 10 MG/ML IJ SOLN
INTRAMUSCULAR | Status: AC
  Filled 2017-11-27: qty 2

## 2017-11-27 MED ORDER — ACETAMINOPHEN 500 MG PO TABS
1000.0000 mg | ORAL_TABLET | ORAL | Status: AC
  Administered 2017-11-27: 1000 mg via ORAL

## 2017-11-27 MED ORDER — DEXAMETHASONE SODIUM PHOSPHATE 10 MG/ML IJ SOLN
INTRAMUSCULAR | Status: DC | PRN
  Administered 2017-11-27: 8 mg via INTRAVENOUS

## 2017-11-27 MED ORDER — BUPIVACAINE HCL (PF) 0.25 % IJ SOLN
INTRAMUSCULAR | Status: AC
  Filled 2017-11-27: qty 30

## 2017-11-27 MED ORDER — ONDANSETRON HCL 4 MG/2ML IJ SOLN
INTRAMUSCULAR | Status: DC | PRN
  Administered 2017-11-27: 4 mg via INTRAVENOUS

## 2017-11-27 MED ORDER — PROPOFOL 10 MG/ML IV BOLUS
INTRAVENOUS | Status: AC
  Filled 2017-11-27: qty 20

## 2017-11-27 MED ORDER — FENTANYL CITRATE (PF) 100 MCG/2ML IJ SOLN
INTRAMUSCULAR | Status: DC | PRN
  Administered 2017-11-27: 50 ug via INTRAVENOUS

## 2017-11-27 MED ORDER — PHENYLEPHRINE 40 MCG/ML (10ML) SYRINGE FOR IV PUSH (FOR BLOOD PRESSURE SUPPORT)
PREFILLED_SYRINGE | INTRAVENOUS | Status: AC
  Filled 2017-11-27: qty 20

## 2017-11-27 MED ORDER — HEPARIN SOD (PORK) LOCK FLUSH 100 UNIT/ML IV SOLN
INTRAVENOUS | Status: AC
  Filled 2017-11-27: qty 5

## 2017-11-27 MED ORDER — ACETAMINOPHEN 500 MG PO TABS
ORAL_TABLET | ORAL | Status: AC
  Administered 2017-11-27: 1000 mg via ORAL
  Filled 2017-11-27: qty 2

## 2017-11-27 MED ORDER — BUPIVACAINE-EPINEPHRINE (PF) 0.5% -1:200000 IJ SOLN
INTRAMUSCULAR | Status: AC
  Filled 2017-11-27: qty 30

## 2017-11-27 MED ORDER — CEFAZOLIN SODIUM-DEXTROSE 2-4 GM/100ML-% IV SOLN
2.0000 g | INTRAVENOUS | Status: AC
  Administered 2017-11-27: 2 g via INTRAVENOUS

## 2017-11-27 MED ORDER — FENTANYL CITRATE (PF) 250 MCG/5ML IJ SOLN
INTRAMUSCULAR | Status: AC
  Filled 2017-11-27: qty 5

## 2017-11-27 SURGICAL SUPPLY — 55 items
ADH SKN CLS APL DERMABOND .7 (GAUZE/BANDAGES/DRESSINGS) ×2
BAG DECANTER FOR FLEXI CONT (MISCELLANEOUS) ×1 IMPLANT
BLADE HEX COATED 2.75 (ELECTRODE) ×3 IMPLANT
BLADE SURG 11 STRL SS (BLADE) ×3 IMPLANT
BLADE SURG 15 STRL LF DISP TIS (BLADE) ×2 IMPLANT
BLADE SURG 15 STRL SS (BLADE) ×3
CANISTER SUCT 3000ML PPV (MISCELLANEOUS) ×2 IMPLANT
CHLORAPREP W/TINT 10.5 ML (MISCELLANEOUS) ×3 IMPLANT
CLIP VESOCCLUDE MED 6/CT (CLIP) ×3 IMPLANT
CLIP VESOCCLUDE SM WIDE 6/CT (CLIP) ×1 IMPLANT
CONT SPEC 4OZ CLIKSEAL STRL BL (MISCELLANEOUS) ×3 IMPLANT
COVER MAYO STAND STRL (DRAPES) ×2 IMPLANT
COVER SURGICAL LIGHT HANDLE (MISCELLANEOUS) ×3 IMPLANT
COVER TRANSDUCER ULTRASND GEL (DRAPE) IMPLANT
CRADLE DONUT ADULT HEAD (MISCELLANEOUS) ×1 IMPLANT
DECANTER SPIKE VIAL GLASS SM (MISCELLANEOUS) ×2 IMPLANT
DERMABOND ADVANCED (GAUZE/BANDAGES/DRESSINGS) ×1
DERMABOND ADVANCED .7 DNX12 (GAUZE/BANDAGES/DRESSINGS) ×2 IMPLANT
DRAPE C-ARM 42X72 X-RAY (DRAPES) ×3 IMPLANT
DRAPE CHEST BREAST 15X10 FENES (DRAPES) ×1 IMPLANT
DRAPE LAPAROTOMY 100X72 PEDS (DRAPES) ×1 IMPLANT
DRAPE UTILITY XL STRL (DRAPES) ×6 IMPLANT
DRAPE WARM FLUID 44X44 (DRAPE) IMPLANT
ELECT CAUTERY BLADE 6.4 (BLADE) ×1 IMPLANT
ELECT REM PT RETURN 9FT ADLT (ELECTROSURGICAL) ×3
ELECTRODE REM PT RTRN 9FT ADLT (ELECTROSURGICAL) ×2 IMPLANT
GAUZE SPONGE 4X4 16PLY XRAY LF (GAUZE/BANDAGES/DRESSINGS) ×3 IMPLANT
GEL ULTRASOUND 20GR AQUASONIC (MISCELLANEOUS) IMPLANT
GLOVE BIO SURGEON STRL SZ 6 (GLOVE) ×3 IMPLANT
GLOVE INDICATOR 6.5 STRL GRN (GLOVE) ×3 IMPLANT
GOWN STRL REUS W/ TWL LRG LVL3 (GOWN DISPOSABLE) ×2 IMPLANT
GOWN STRL REUS W/TWL 2XL LVL3 (GOWN DISPOSABLE) ×4 IMPLANT
GOWN STRL REUS W/TWL LRG LVL3 (GOWN DISPOSABLE) ×3
KIT BASIN OR (CUSTOM PROCEDURE TRAY) ×3 IMPLANT
KIT ROOM TURNOVER OR (KITS) ×3 IMPLANT
NDL HYPO 25GX1X1/2 BEV (NEEDLE) ×1 IMPLANT
NEEDLE HYPO 25GX1X1/2 BEV (NEEDLE) ×3 IMPLANT
NS IRRIG 1000ML POUR BTL (IV SOLUTION) ×3 IMPLANT
PACK SURGICAL SETUP 50X90 (CUSTOM PROCEDURE TRAY) ×3 IMPLANT
PAD ARMBOARD 7.5X6 YLW CONV (MISCELLANEOUS) ×6 IMPLANT
PENCIL BUTTON HOLSTER BLD 10FT (ELECTRODE) ×3 IMPLANT
SUT MON AB 4-0 PC3 18 (SUTURE) ×3 IMPLANT
SUT PROLENE 2 0 SH DA (SUTURE) ×2 IMPLANT
SUT VIC AB 2-0 SH 27 (SUTURE) ×3
SUT VIC AB 2-0 SH 27XBRD (SUTURE) ×1 IMPLANT
SUT VIC AB 3-0 SH 27 (SUTURE) ×3
SUT VIC AB 3-0 SH 27X BRD (SUTURE) ×2 IMPLANT
SYR 20ML ECCENTRIC (SYRINGE) ×2 IMPLANT
SYR 5ML LUER SLIP (SYRINGE) ×1 IMPLANT
SYR BULB 3OZ (MISCELLANEOUS) ×3 IMPLANT
SYR CONTROL 10ML LL (SYRINGE) ×3 IMPLANT
TOWEL OR 17X24 6PK STRL BLUE (TOWEL DISPOSABLE) ×3 IMPLANT
TOWEL OR 17X26 10 PK STRL BLUE (TOWEL DISPOSABLE) ×3 IMPLANT
TUBE CONNECTING 12X1/4 (SUCTIONS) ×3 IMPLANT
YANKAUER SUCT BULB TIP NO VENT (SUCTIONS) ×2 IMPLANT

## 2017-11-27 NOTE — Op Note (Signed)
Pre op diagnosis:  Lymphadenopathy, non hodgkins lymphoma, need for additional tissue  Post op diagnosis:  Same  Procedure performed:  Left inguinal lymph node biopsy  Surgeon:  Stark Klein, MD  Anesthesia:  General and local  EBL:  Minimal  Specimen:  L inguinal lymph node for lymphoma workup  Findings:  Large inguinal nodes.     Procedure:   Patient was identified in the holding area and taken to the operating room and placed supine on the operating room table.  General anesthesia was induced with LMA.  The patient's left groin and abdomen was clipped, prepped and draped in sterile fashion.  Time out was performed according to the surgical safety check list.  When all was correct, we continued.  The patient's platelets were finishing running in as we started.   The inguinal region was infiltrated with local anesthetic.  The skin was incised with a #10 blade.  The subcutaneous tissues were divided with the cautery.  A Wietlaner retractor was used to assist with visualization.  Scarpa's fascia was opened.    Several very large lymph nodes were immediately apparent.  This was elevated with an Allis clamp.  Hemaclips were used to ligate the lymphovascular channels entering the node from all sides.   Two larger nodes were removed.  Once this was complete, the nodes were passed off for lymphoma workup.    The cavity was irrigated copiously.  Hemostasis was achieved with the cautery. Scarpa's fascia was closed with running 2-0 vicryl.  The wound was closed with interrupted 3-0 Vicryl deep dermal sutures and 4-0 Monocryl running subcuticular suture.  The wound was cleaned, dried and dressed with Dermabond.    The patient was awakened from anesthesia and taken to the PACU in stable condition.  Needle, sponge, and instrument counts were correct.

## 2017-11-27 NOTE — Transfer of Care (Signed)
Immediate Anesthesia Transfer of Care Note  Patient: Alicia [name]  Procedure(s) Performed: LEFT INGUINAL LYMPH NODE BIOPSY (Left )  Patient Location: PACU  Anesthesia Type:General  Level of Consciousness: awake, alert  and oriented  Airway & Oxygen Therapy: Patient Spontanous Breathing and Patient connected to nasal cannula oxygen  Post-op Assessment: Report given to RN and Post -op Vital signs reviewed and stable  Post vital signs: Reviewed and stable  Last Vitals:  Vitals:   11/27/17 0904 11/27/17 1214  BP: (!) 190/82   Pulse: 64   Resp: 20   Temp: 36.6 C 36.5 C  SpO2: 99%     Last Pain:  Vitals:   11/27/17 0904  TempSrc: Oral      Patients Stated Pain Goal: 9 (39/68/86 [id number])  Complications: No apparent anesthesia complications

## 2017-11-27 NOTE — Anesthesia Preprocedure Evaluation (Signed)
Anesthesia Evaluation  Patient identified by MRN, date of birth, ID band Patient awake    Reviewed: Allergy & Precautions, H&P , Patient's Chart, lab work & pertinent test results, reviewed documented beta blocker date and time   Airway Mallampati: II  TM Distance: >3 FB Neck ROM: full    Dental no notable dental hx.    Pulmonary    Pulmonary exam normal breath sounds clear to auscultation       Cardiovascular hypertension,  Rhythm:regular Rate:Normal     Neuro/Psych    GI/Hepatic   Endo/Other    Renal/GU      Musculoskeletal   Abdominal   Peds  Hematology   Anesthesia Other Findings   Reproductive/Obstetrics                             Anesthesia Physical Anesthesia Plan  ASA: II  Anesthesia Plan: General   Post-op Pain Management:    Induction: Intravenous  PONV Risk Score and Plan: 3 and Treatment may vary due to age or medical condition, Dexamethasone and Ondansetron  Airway Management Planned: LMA  Additional Equipment:   Intra-op Plan:   Post-operative Plan:   Informed Consent: I have reviewed the patients History and Physical, chart, labs and discussed the procedure including the risks, benefits and alternatives for the proposed anesthesia with the patient or authorized representative who has indicated his/her understanding and acceptance.   Dental Advisory Given  Plan Discussed with: CRNA and Surgeon  Anesthesia Plan Comments: ( )        Anesthesia Quick Evaluation

## 2017-11-27 NOTE — Interval H&P Note (Signed)
History and Physical Interval Note:  11/27/2017 9:35 AM  Alicia [name]  has presented today for surgery, with the diagnosis of lymphoma  The various methods of treatment have been discussed with the patient and family. After consideration of risks, benefits and other options for treatment, the patient has consented to  Procedure(s): LEFT INGUINAL LYMPH NODE BIOPSY (Left) INSERTION PORT-A-CATH (N/A) as a surgical intervention .  The patient's history has been reviewed, patient examined, no change in status, stable for surgery.  I have reviewed the patient's chart and labs.  Questions were answered to the patient's satisfaction.     Stark Klein

## 2017-11-27 NOTE — Discharge Instructions (Signed)
[facility] Office Phone Number [phone]   POST OP INSTRUCTIONS  Always review your discharge instruction sheet given to you by the facility where your surgery was performed.  IF YOU HAVE DISABILITY OR FAMILY LEAVE FORMS, YOU MUST BRING THEM TO THE OFFICE FOR PROCESSING.  DO NOT GIVE THEM TO YOUR DOCTOR.  1. A prescription for pain medication may be given to you upon discharge.  Take your pain medication as prescribed, if needed.  If narcotic pain medicine is not needed, then you may take acetaminophen (Tylenol) or ibuprofen (Advil) as needed. 2. Take your usually prescribed medications unless otherwise directed 3. If you need a refill on your pain medication, please contact your pharmacy.  They will contact our office to request authorization.  Prescriptions will not be filled after 5pm or on week-ends. 4. You should eat very light the first 24 hours after surgery, such as soup, crackers, pudding, etc.  Resume your normal diet the day after surgery 5. It is common to experience some constipation if taking pain medication after surgery.  Increasing fluid intake and taking a stool softener will usually help or prevent this problem from occurring.  A mild laxative (Milk of Magnesia or Miralax) should be taken according to package directions if there are no bowel movements after 48 hours. 6. You may shower in 48 hours.  The surgical glue will flake off in 2-3 weeks.   7. ACTIVITIES:  No strenuous activity or heavy lifting for 1 week.   a. You may drive when you no longer are taking prescription pain medication, you can comfortably wear a seatbelt, and you can safely maneuver your car and apply brakes. b. RETURN TO WORK:  __________n/a_______________ You should see your doctor in the office for a follow-up appointment approximately three-four weeks after your surgery.    WHEN TO CALL YOUR DOCTOR: 1. Fever over 101.0 2. Nausea and/or vomiting. 3. Extreme swelling or  bruising. 4. Continued bleeding from incision. 5. Increased pain, redness, or drainage from the incision.  The clinic staff is available to answer your questions during regular business hours.  Please don't hesitate to call and ask to speak to one of the nurses for clinical concerns.  If you have a medical emergency, go to the nearest emergency room or call 911.  A surgeon from [facility] is always on call at the hospital.  For further questions, please visit [email or web address]      

## 2017-11-27 NOTE — Anesthesia Procedure Notes (Signed)
Procedure Name: LMA Insertion Date/Time: 11/27/2017 11:34 AM Performed by: Mariea Clonts, CRNA Pre-anesthesia Checklist: Patient identified, Emergency Drugs available, Suction available and Patient being monitored Patient Re-evaluated:Patient Re-evaluated prior to induction Oxygen Delivery Method: Circle System Utilized Preoxygenation: Pre-oxygenation with 100% oxygen Induction Type: IV induction Ventilation: Mask ventilation without difficulty LMA: LMA inserted LMA Size: 4.0 Number of attempts: 1 Airway Equipment and Method: Bite block Placement Confirmation: positive ETCO2 Tube secured with: Tape Dental Injury: Teeth and Oropharynx as per pre-operative assessment

## 2017-11-28 ENCOUNTER — Encounter (HOSPITAL_COMMUNITY): Payer: Self-pay | Admitting: General Surgery

## 2017-11-28 NOTE — Anesthesia Postprocedure Evaluation (Signed)
Anesthesia Post Note  Patient: Alicia [name]  Procedure(s) Performed: LEFT INGUINAL LYMPH NODE BIOPSY (Left )     Patient location during evaluation: PACU Anesthesia Type: General Level of consciousness: awake and alert Pain management: pain level controlled Vital Signs Assessment: post-procedure vital signs reviewed and stable Respiratory status: spontaneous breathing, nonlabored ventilation, respiratory function stable and patient connected to nasal cannula oxygen Cardiovascular status: blood pressure returned to baseline and stable Postop Assessment: no apparent nausea or vomiting Anesthetic complications: no    Last Vitals:  Vitals:   11/27/17 1240 11/27/17 1241  BP:  (!) 151/78  Pulse: 69   Resp: 18   Temp: (!) 36.2 C   SpO2: 94%     Last Pain:  Vitals:   11/27/17 1240  TempSrc:   PainSc: 1    Pain Goal: Patients Stated Pain Goal: 9 (11/27/17 0914)               Lyndle Herrlich EDWARD

## 2017-12-04 ENCOUNTER — Telehealth: Payer: Self-pay | Admitting: Hematology

## 2017-12-04 DIAGNOSIS — R69 Illness, unspecified: Secondary | ICD-10-CM | POA: Diagnosis not present

## 2017-12-04 NOTE — Telephone Encounter (Signed)
PT CALLED AND WOULD LIKE HER MEDICAL RECORDS TO BE MAILED TO HER.

## 2017-12-06 ENCOUNTER — Other Ambulatory Visit: Payer: Self-pay | Admitting: General Surgery

## 2017-12-10 ENCOUNTER — Encounter (HOSPITAL_BASED_OUTPATIENT_CLINIC_OR_DEPARTMENT_OTHER): Payer: Self-pay | Admitting: *Deleted

## 2017-12-10 ENCOUNTER — Other Ambulatory Visit: Payer: Self-pay

## 2017-12-11 NOTE — Progress Notes (Signed)
Ensure pre surgery drink given with instructions to complete by 0700 dos, surgical scrub soap given with instructions, pt verbalized understanding.

## 2017-12-12 ENCOUNTER — Ambulatory Visit (HOSPITAL_COMMUNITY): Payer: Medicare HMO

## 2017-12-12 ENCOUNTER — Encounter (HOSPITAL_BASED_OUTPATIENT_CLINIC_OR_DEPARTMENT_OTHER)
Admission: RE | Disposition: A | Discharge status: Home / Self Care | Payer: Self-pay | Source: Ambulatory Visit | Attending: General Surgery

## 2017-12-12 ENCOUNTER — Ambulatory Visit (HOSPITAL_BASED_OUTPATIENT_CLINIC_OR_DEPARTMENT_OTHER): Payer: Medicare HMO | Admitting: Certified Registered"

## 2017-12-12 ENCOUNTER — Ambulatory Visit (HOSPITAL_BASED_OUTPATIENT_CLINIC_OR_DEPARTMENT_OTHER)
Admission: RE | Admit: 2017-12-12 | Discharge: 2017-12-12 | Disposition: A | Discharge status: Home / Self Care | Payer: Medicare HMO | Source: Ambulatory Visit | Attending: General Surgery | Admitting: General Surgery

## 2017-12-12 ENCOUNTER — Other Ambulatory Visit: Payer: Self-pay

## 2017-12-12 ENCOUNTER — Encounter (HOSPITAL_BASED_OUTPATIENT_CLINIC_OR_DEPARTMENT_OTHER): Payer: Self-pay | Admitting: *Deleted

## 2017-12-12 DIAGNOSIS — C8510 Unspecified B-cell lymphoma, unspecified site: Secondary | ICD-10-CM | POA: Diagnosis present

## 2017-12-12 DIAGNOSIS — C8590 Non-Hodgkin lymphoma, unspecified, unspecified site: Secondary | ICD-10-CM

## 2017-12-12 DIAGNOSIS — E7800 Pure hypercholesterolemia, unspecified: Secondary | ICD-10-CM | POA: Insufficient documentation

## 2017-12-12 DIAGNOSIS — C8515 Unspecified B-cell lymphoma, lymph nodes of inguinal region and lower limb: Secondary | ICD-10-CM | POA: Diagnosis not present

## 2017-12-12 DIAGNOSIS — C8595 Non-Hodgkin lymphoma, unspecified, lymph nodes of inguinal region and lower limb: Secondary | ICD-10-CM | POA: Diagnosis not present

## 2017-12-12 DIAGNOSIS — M1990 Unspecified osteoarthritis, unspecified site: Secondary | ICD-10-CM | POA: Diagnosis not present

## 2017-12-12 DIAGNOSIS — H9190 Unspecified hearing loss, unspecified ear: Secondary | ICD-10-CM | POA: Diagnosis not present

## 2017-12-12 DIAGNOSIS — Z803 Family history of malignant neoplasm of breast: Secondary | ICD-10-CM | POA: Diagnosis not present

## 2017-12-12 DIAGNOSIS — I10 Essential (primary) hypertension: Secondary | ICD-10-CM | POA: Insufficient documentation

## 2017-12-12 DIAGNOSIS — I700 Atherosclerosis of aorta: Secondary | ICD-10-CM | POA: Insufficient documentation

## 2017-12-12 DIAGNOSIS — Z95828 Presence of other vascular implants and grafts: Secondary | ICD-10-CM

## 2017-12-12 DIAGNOSIS — K5730 Diverticulosis of large intestine without perforation or abscess without bleeding: Secondary | ICD-10-CM | POA: Diagnosis not present

## 2017-12-12 DIAGNOSIS — Z79899 Other long term (current) drug therapy: Secondary | ICD-10-CM | POA: Diagnosis not present

## 2017-12-12 DIAGNOSIS — N200 Calculus of kidney: Secondary | ICD-10-CM | POA: Insufficient documentation

## 2017-12-12 DIAGNOSIS — Z452 Encounter for adjustment and management of vascular access device: Secondary | ICD-10-CM | POA: Diagnosis not present

## 2017-12-12 DIAGNOSIS — K219 Gastro-esophageal reflux disease without esophagitis: Secondary | ICD-10-CM | POA: Diagnosis not present

## 2017-12-12 HISTORY — DX: Gastro-esophageal reflux disease without esophagitis: K21.9

## 2017-12-12 HISTORY — DX: Unspecified osteoarthritis, unspecified site: M19.90

## 2017-12-12 HISTORY — PX: PORTACATH PLACEMENT: SHX2246

## 2017-12-12 SURGERY — INSERTION, TUNNELED CENTRAL VENOUS DEVICE, WITH PORT
Anesthesia: General | Site: Chest | Laterality: Left

## 2017-12-12 MED ORDER — OXYCODONE HCL 5 MG/5ML PO SOLN: 5.0000 mg | Freq: Once | ORAL | Status: DC | PRN

## 2017-12-12 MED ORDER — SODIUM CHLORIDE 0.9 % IV SOLN: 250.0000 mL | INTRAVENOUS | Status: DC | PRN

## 2017-12-12 MED ORDER — CHLORHEXIDINE GLUCONATE CLOTH 2 % EX PADS: 6.0000 | MEDICATED_PAD | Freq: Once | CUTANEOUS | Status: DC

## 2017-12-12 MED ORDER — LIDOCAINE 2% (20 MG/ML) 5 ML SYRINGE
INTRAMUSCULAR | Status: DC | PRN
  Administered 2017-12-12: 100 mg via INTRAVENOUS

## 2017-12-12 MED ORDER — CEFAZOLIN SODIUM-DEXTROSE 2-4 GM/100ML-% IV SOLN
2.0000 g | INTRAVENOUS | Status: AC
  Administered 2017-12-12: 2 g via INTRAVENOUS

## 2017-12-12 MED ORDER — ACETAMINOPHEN 325 MG PO TABS: 650.0000 mg | ORAL_TABLET | ORAL | Status: DC | PRN

## 2017-12-12 MED ORDER — MIDAZOLAM HCL 2 MG/2ML IJ SOLN
INTRAMUSCULAR | Status: AC
  Filled 2017-12-12: qty 2

## 2017-12-12 MED ORDER — LIDOCAINE HCL 1 % IJ SOLN
INTRAMUSCULAR | Status: DC | PRN
  Administered 2017-12-12: 12 mL via INTRAMUSCULAR

## 2017-12-12 MED ORDER — EPHEDRINE SULFATE-NACL 50-0.9 MG/10ML-% IV SOSY
PREFILLED_SYRINGE | INTRAVENOUS | Status: DC | PRN
  Administered 2017-12-12 (×3): 10 mg via INTRAVENOUS

## 2017-12-12 MED ORDER — SODIUM CHLORIDE 0.9% FLUSH: 3.0000 mL | INTRAVENOUS | Status: DC | PRN

## 2017-12-12 MED ORDER — ACETAMINOPHEN 650 MG RE SUPP: 650.0000 mg | RECTAL | Status: DC | PRN

## 2017-12-12 MED ORDER — EPHEDRINE 5 MG/ML INJ
INTRAVENOUS | Status: AC
  Filled 2017-12-12: qty 10

## 2017-12-12 MED ORDER — MIDAZOLAM HCL 2 MG/2ML IJ SOLN: 1.0000 mg | INTRAMUSCULAR | Status: DC | PRN

## 2017-12-12 MED ORDER — OXYCODONE HCL 5 MG PO TABS: 5.0000 mg | ORAL_TABLET | Freq: Once | ORAL | Status: DC | PRN

## 2017-12-12 MED ORDER — DEXAMETHASONE SODIUM PHOSPHATE 10 MG/ML IJ SOLN
INTRAMUSCULAR | Status: AC
  Filled 2017-12-12: qty 1

## 2017-12-12 MED ORDER — PROPOFOL 10 MG/ML IV BOLUS
INTRAVENOUS | Status: AC
  Filled 2017-12-12: qty 20

## 2017-12-12 MED ORDER — ONDANSETRON HCL 4 MG/2ML IJ SOLN
INTRAMUSCULAR | Status: AC
  Filled 2017-12-12: qty 2

## 2017-12-12 MED ORDER — FENTANYL CITRATE (PF) 100 MCG/2ML IJ SOLN: 25.0000 ug | INTRAMUSCULAR | Status: DC | PRN

## 2017-12-12 MED ORDER — GLYCOPYRROLATE 0.2 MG/ML IV SOSY
PREFILLED_SYRINGE | INTRAVENOUS | Status: DC | PRN
  Administered 2017-12-12: .2 mg via INTRAVENOUS

## 2017-12-12 MED ORDER — PROMETHAZINE HCL 25 MG/ML IJ SOLN
6.2500 mg | INTRAMUSCULAR | Status: DC | PRN
Start: 2017-12-12 — End: 2017-12-12

## 2017-12-12 MED ORDER — MEPERIDINE HCL 25 MG/ML IJ SOLN: 6.2500 mg | INTRAMUSCULAR | Status: DC | PRN

## 2017-12-12 MED ORDER — PHENYLEPHRINE 40 MCG/ML (10ML) SYRINGE FOR IV PUSH (FOR BLOOD PRESSURE SUPPORT)
PREFILLED_SYRINGE | INTRAVENOUS | Status: DC | PRN
  Administered 2017-12-12: 80 ug via INTRAVENOUS

## 2017-12-12 MED ORDER — DEXAMETHASONE SODIUM PHOSPHATE 10 MG/ML IJ SOLN
INTRAMUSCULAR | Status: DC | PRN
  Administered 2017-12-12: 10 mg via INTRAVENOUS

## 2017-12-12 MED ORDER — SCOPOLAMINE 1 MG/3DAYS TD PT72: 1.0000 | MEDICATED_PATCH | Freq: Once | TRANSDERMAL | Status: DC | PRN

## 2017-12-12 MED ORDER — HEPARIN SOD (PORK) LOCK FLUSH 100 UNIT/ML IV SOLN
INTRAVENOUS | Status: DC | PRN
  Administered 2017-12-12: 500 [IU] via INTRAVENOUS

## 2017-12-12 MED ORDER — HEPARIN (PORCINE) IN NACL 2-0.9 UNIT/ML-% IJ SOLN
INTRAMUSCULAR | Status: AC | PRN
  Administered 2017-12-12: 500 mL via INTRAVENOUS

## 2017-12-12 MED ORDER — FENTANYL CITRATE (PF) 100 MCG/2ML IJ SOLN
INTRAMUSCULAR | Status: AC
  Filled 2017-12-12: qty 2

## 2017-12-12 MED ORDER — LACTATED RINGERS IV SOLN
INTRAVENOUS | Status: DC
  Administered 2017-12-12: 10:00:00 via INTRAVENOUS

## 2017-12-12 MED ORDER — PHENYLEPHRINE 40 MCG/ML (10ML) SYRINGE FOR IV PUSH (FOR BLOOD PRESSURE SUPPORT)
PREFILLED_SYRINGE | INTRAVENOUS | Status: AC
  Filled 2017-12-12: qty 10

## 2017-12-12 MED ORDER — FENTANYL CITRATE (PF) 100 MCG/2ML IJ SOLN
50.0000 ug | INTRAMUSCULAR | Status: DC | PRN
  Administered 2017-12-12: 50 ug via INTRAVENOUS

## 2017-12-12 MED ORDER — PROPOFOL 10 MG/ML IV BOLUS
INTRAVENOUS | Status: DC | PRN
  Administered 2017-12-12: 150 mg via INTRAVENOUS

## 2017-12-12 MED ORDER — OXYCODONE HCL 5 MG PO TABS: 5.0000 mg | ORAL_TABLET | ORAL | Status: DC | PRN

## 2017-12-12 MED ORDER — CEFAZOLIN SODIUM-DEXTROSE 2-4 GM/100ML-% IV SOLN
INTRAVENOUS | Status: AC
  Filled 2017-12-12: qty 100

## 2017-12-12 MED ORDER — SODIUM CHLORIDE 0.9% FLUSH: 3.0000 mL | Freq: Two times a day (BID) | INTRAVENOUS | Status: DC

## 2017-12-12 MED ORDER — ONDANSETRON HCL 4 MG/2ML IJ SOLN
INTRAMUSCULAR | Status: DC | PRN
  Administered 2017-12-12: 4 mg via INTRAVENOUS

## 2017-12-12 SURGICAL SUPPLY — 42 items
ADH SKN CLS APL DERMABOND .7 (GAUZE/BANDAGES/DRESSINGS) ×1
BAG DECANTER FOR FLEXI CONT (MISCELLANEOUS) ×2 IMPLANT
BLADE HEX COATED 2.75 (ELECTRODE) ×2 IMPLANT
BLADE SURG 11 STRL SS (BLADE) ×2 IMPLANT
BLADE SURG 15 STRL LF DISP TIS (BLADE) ×1 IMPLANT
BLADE SURG 15 STRL SS (BLADE) ×2
CHLORAPREP W/TINT 26ML (MISCELLANEOUS) ×2 IMPLANT
COVER BACK TABLE 60X90IN (DRAPES) ×2 IMPLANT
COVER MAYO STAND STRL (DRAPES) ×2 IMPLANT
DECANTER SPIKE VIAL GLASS SM (MISCELLANEOUS) IMPLANT
DERMABOND ADVANCED (GAUZE/BANDAGES/DRESSINGS) ×1
DERMABOND ADVANCED .7 DNX12 (GAUZE/BANDAGES/DRESSINGS) ×1 IMPLANT
DRAPE C-ARM 42X72 X-RAY (DRAPES) ×2 IMPLANT
DRAPE LAPAROTOMY TRNSV 102X78 (DRAPE) ×2 IMPLANT
DRAPE UTILITY XL STRL (DRAPES) ×2 IMPLANT
DRSG TEGADERM 4X4.75 (GAUZE/BANDAGES/DRESSINGS) IMPLANT
ELECT REM PT RETURN 9FT ADLT (ELECTROSURGICAL) ×2
ELECTRODE REM PT RTRN 9FT ADLT (ELECTROSURGICAL) ×1 IMPLANT
GAUZE SPONGE 4X4 12PLY STRL LF (GAUZE/BANDAGES/DRESSINGS) IMPLANT
GLOVE BIO SURGEON STRL SZ 6 (GLOVE) ×2 IMPLANT
GLOVE BIOGEL PI IND STRL 6.5 (GLOVE) ×1 IMPLANT
GLOVE BIOGEL PI INDICATOR 6.5 (GLOVE) ×1
GOWN STRL REUS W/ TWL LRG LVL3 (GOWN DISPOSABLE) ×1 IMPLANT
GOWN STRL REUS W/TWL 2XL LVL3 (GOWN DISPOSABLE) ×2 IMPLANT
GOWN STRL REUS W/TWL LRG LVL3 (GOWN DISPOSABLE) ×2
IV CONNECTOR ONE LINK NDLESS (IV SETS) IMPLANT
KIT PORT POWER 8FR ISP CVUE (Miscellaneous) ×1 IMPLANT
NDL HYPO 25X1 1.5 SAFETY (NEEDLE) ×1 IMPLANT
NEEDLE HYPO 25X1 1.5 SAFETY (NEEDLE) ×2 IMPLANT
PACK BASIN DAY SURGERY FS (CUSTOM PROCEDURE TRAY) ×2 IMPLANT
PENCIL BUTTON HOLSTER BLD 10FT (ELECTRODE) ×2 IMPLANT
SLEEVE SCD COMPRESS KNEE MED (MISCELLANEOUS) ×2 IMPLANT
SUT MNCRL AB 4-0 PS2 18 (SUTURE) ×2 IMPLANT
SUT PROLENE 2 0 SH DA (SUTURE) ×4 IMPLANT
SUT VIC AB 3-0 SH 27 (SUTURE) ×2
SUT VIC AB 3-0 SH 27X BRD (SUTURE) ×1 IMPLANT
SUT VICRYL 3-0 CR8 SH (SUTURE) IMPLANT
SYR 10ML LL (SYRINGE) ×2 IMPLANT
SYR 5ML LUER SLIP (SYRINGE) ×2 IMPLANT
SYR CONTROL 10ML LL (SYRINGE) ×2 IMPLANT
TOWEL OR 17X24 6PK STRL BLUE (TOWEL DISPOSABLE) ×2 IMPLANT
TOWEL OR NON WOVEN STRL DISP B (DISPOSABLE) ×2 IMPLANT

## 2017-12-12 NOTE — Op Note (Signed)
PREOPERATIVE DIAGNOSIS:  lymphoma     POSTOPERATIVE DIAGNOSIS:  Same     PROCEDURE: Left subclavian port placement, Bard   Power Port, MRI safe, 8-French.      SURGEON:  Stark Klein, MD      ANESTHESIA:  General   FINDINGS:  Good venous return, easy flush, and tip of the catheter and   SVC 24 cm.      SPECIMEN:  None.      ESTIMATED BLOOD LOSS:  Minimal.      COMPLICATIONS:  None known.      PROCEDURE:  Pt was identified in the holding area and taken to   the operating room, where patient was placed supine on the operating room   table.  General anesthesia was induced.  Patient's arms were tucked and the upper   chest and neck were prepped and draped in sterile fashion.  Time-out was   performed according to the surgical safety check list.  When all was   correct, we continued.   Local anesthetic was administered over this   area at the angle of the clavicle.  The vein was accessed with 1 pass(es) of the needle. There was good venous return and the wire passed easily with no ectopy.   Fluoroscopy was used to confirm that the wire was in the vena cava.      The patient was placed back level and the area for the pocket was anethetized   with local anesthetic.  A 3-cm transverse incision was made with a #15   blade.  Cautery was used to divide the subcutaneous tissues down to the   pectoralis muscle.  An [employer] retractor was used to elevate the skin   while a pocket was created on top of the pectoralis fascia.  The port   was placed into the pocket to confirm that it was of adequate size.  The   catheter was preattached to the port.  The port was then secured to the   pectoralis fascia with four 2-0 Prolene sutures.  These were clamped and   not tied down yet.    The catheter was tunneled through to the wire exit   site.  The catheter was placed along the wire to determine what length it should be to be in the SVC.  The catheter was cut at 24 cm.  The tunneler sheath and  dilator were passed over the wire and the dilator and wire were removed.  The catheter was advanced through the tunneler sheath and the tunneler sheath was pulled away.  Care was taken to keep the catheter in the tunneler sheath as this occurred. This was advanced and the tunneler sheath was removed.  There was good venous   return and easy flush of the catheter.  The Prolene sutures were tied   down to the pectoral fascia.  The skin was reapproximated using 3-0   Vicryl interrupted deep dermal sutures.    Fluoroscopy was used to re-confirm good position of the catheter.  The skin   was then closed using 4-0 Monocryl in a subcuticular fashion.  The port was flushed with concentrated heparin flush as well.  The wounds were then cleaned, dried, and dressed with Dermabond.  The patient was awakened from anesthesia and taken to the PACU in stable condition.  Needle, sponge, and instrument counts were correct.               Stark Klein, MD

## 2017-12-12 NOTE — Anesthesia Procedure Notes (Signed)
Procedure Name: LMA Insertion Date/Time: 12/12/2017 10:40 AM Performed by: Gwyndolyn Saxon, CRNA Pre-anesthesia Checklist: Patient identified, Emergency Drugs available, Patient being monitored, Suction available and Timeout performed Patient Re-evaluated:Patient Re-evaluated prior to induction Oxygen Delivery Method: Circle system utilized Preoxygenation: Pre-oxygenation with 100% oxygen Induction Type: IV induction Ventilation: Mask ventilation without difficulty LMA: LMA inserted LMA Size: 4.0 Number of attempts: 1 Placement Confirmation: positive ETCO2,  CO2 detector and breath sounds checked- equal and bilateral Tube secured with: Tape Dental Injury: Teeth and Oropharynx as per pre-operative assessment

## 2017-12-12 NOTE — Anesthesia Preprocedure Evaluation (Signed)
Anesthesia Evaluation  Patient identified by MRN, date of birth, ID band Patient awake    Reviewed: Allergy & Precautions, H&P , NPO status , Patient's Chart, lab work & pertinent test results, reviewed documented beta blocker date and time   Airway Mallampati: II  TM Distance: >3 FB Neck ROM: full    Dental no notable dental hx.    Pulmonary neg pulmonary ROS,    Pulmonary exam normal breath sounds clear to auscultation       Cardiovascular hypertension,  Rhythm:regular Rate:Normal     Neuro/Psych negative neurological ROS  negative psych ROS   GI/Hepatic Neg liver ROS, GERD  ,  Endo/Other  negative endocrine ROS  Renal/GU negative Renal ROS     Musculoskeletal  (+) Arthritis ,   Abdominal   Peds  Hematology negative hematology ROS (+)   Anesthesia Other Findings   Reproductive/Obstetrics negative OB ROS                             Anesthesia Physical  Anesthesia Plan  ASA: II  Anesthesia Plan: General   Post-op Pain Management:    Induction: Intravenous  PONV Risk Score and Plan: 3 and Treatment may vary due to age or medical condition, Dexamethasone and Ondansetron  Airway Management Planned: LMA  Additional Equipment:   Intra-op Plan:   Post-operative Plan: Extubation in OR  Informed Consent: I have reviewed the patients History and Physical, chart, labs and discussed the procedure including the risks, benefits and alternatives for the proposed anesthesia with the patient or authorized representative who has indicated his/her understanding and acceptance.   Dental advisory given  Plan Discussed with: CRNA  Anesthesia Plan Comments: ( )        Anesthesia Quick Evaluation

## 2017-12-12 NOTE — Interval H&P Note (Signed)
History and Physical Interval Note:  12/12/2017 10:18 AM  Alicia [name]  has presented today for surgery, with the diagnosis of LYMPHOMA  The various methods of treatment have been discussed with the patient and family. After consideration of risks, benefits and other options for treatment, the patient has consented to  Procedure(s): INSERTION PORT-A-CATH (N/A) as a surgical intervention .  The patient's history has been reviewed, patient examined, no change in status, stable for surgery.  I have reviewed the patient's chart and labs.  Questions were answered to the patient's satisfaction.     Stark Klein

## 2017-12-12 NOTE — Transfer of Care (Signed)
Immediate Anesthesia Transfer of Care Note  Patient: Alicia [name]  Procedure(s) Performed: INSERTION PORT-A-CATH (Left Chest)  Patient Location: PACU  Anesthesia Type:General  Level of Consciousness: awake, alert  and oriented  Airway & Oxygen Therapy: Patient Spontanous Breathing and Patient connected to face mask oxygen  Post-op Assessment: Report given to RN and Post -op Vital signs reviewed and stable  Post vital signs: Reviewed and stable  Last Vitals:  Vitals:   12/12/17 0954 12/12/17 1122  BP: (!) 141/73   Pulse: (!) 56   Resp: 20   Temp: 36.7 C 36.4 C  SpO2: 99%     Last Pain:  Vitals:   12/12/17 0954  TempSrc: Oral         Complications: No apparent anesthesia complications

## 2017-12-12 NOTE — Discharge Instructions (Addendum)
[facility] Office Phone Number [phone]   POST OP INSTRUCTIONS  Always review your discharge instruction sheet given to you by the facility where your surgery was performed.  IF YOU HAVE DISABILITY OR FAMILY LEAVE FORMS, YOU MUST BRING THEM TO THE OFFICE FOR PROCESSING.  DO NOT GIVE THEM TO YOUR DOCTOR.  1. A prescription for pain medication may be given to you upon discharge.  Take your pain medication as prescribed, if needed.  If narcotic pain medicine is not needed, then you may take acetaminophen (Tylenol) or ibuprofen (Advil) as needed. 2. Take your usually prescribed medications unless otherwise directed 3. If you need a refill on your pain medication, please contact your pharmacy.  They will contact our office to request authorization.  Prescriptions will not be filled after 5pm or on week-ends. 4. You should eat very light the first 24 hours after surgery, such as soup, crackers, pudding, etc.  Resume your normal diet the day after surgery 5. It is common to experience some constipation if taking pain medication after surgery.  Increasing fluid intake and taking a stool softener will usually help or prevent this problem from occurring.  A mild laxative (Milk of Magnesia or Miralax) should be taken according to package directions if there are no bowel movements after 48 hours. 6. You may shower in 48 hours.  The surgical glue will flake off in 2-3 weeks.   7. ACTIVITIES:  No strenuous activity or heavy lifting for 1 week.   a. You may drive when you no longer are taking prescription pain medication, you can comfortably wear a seatbelt, and you can safely maneuver your car and apply brakes. b. RETURN TO WORK:  __________n/a_______________ You should see your doctor in the office for a follow-up appointment approximately three-four weeks after your surgery.    WHEN TO CALL YOUR DOCTOR: 1. Fever over 101.0 2. Nausea and/or vomiting. 3. Extreme swelling or  bruising. 4. Continued bleeding from incision. 5. Increased pain, redness, or drainage from the incision.  The clinic staff is available to answer your questions during regular business hours.  Please don't hesitate to call and ask to speak to one of the nurses for clinical concerns.  If you have a medical emergency, go to the nearest emergency room or call 911.  A surgeon from [facility] is always on call at the hospital.  For further questions, please visit [email or web address]     Post Anesthesia Home Care Instructions  Activity: Get plenty of rest for the remainder of the day. A responsible individual must stay with you for 24 hours following the procedure.  For the next 24 hours, DO NOT: -Drive a car -Operate machinery -Drink alcoholic beverages -Take any medication unless instructed by your physician -Make any legal decisions or sign important papers.  Meals: Start with liquid foods such as gelatin or soup. Progress to regular foods as tolerated. Avoid greasy, spicy, heavy foods. If nausea and/or vomiting occur, drink only clear liquids until the nausea and/or vomiting subsides. Call your physician if vomiting continues.  Special Instructions/Symptoms: Your throat may feel dry or sore from the anesthesia or the breathing tube placed in your throat during surgery. If this causes discomfort, gargle with warm salt water. The discomfort should disappear within 24 hours.  If you had a scopolamine patch placed behind your ear for the management of post- operative nausea and/or vomiting:  1. The medication in the patch is effective for 72 hours, after which it should   be removed.  Wrap patch in a tissue and discard in the trash. Wash hands thoroughly with soap and water. 2. You may remove the patch earlier than 72 hours if you experience unpleasant side effects which may include dry mouth, dizziness or visual disturbances. 3. Avoid touching the patch. Wash your hands  with soap and water after contact with the patch.      

## 2017-12-13 ENCOUNTER — Encounter (HOSPITAL_BASED_OUTPATIENT_CLINIC_OR_DEPARTMENT_OTHER): Payer: Self-pay | Admitting: General Surgery

## 2017-12-13 NOTE — Addendum Note (Signed)
Addendum  created 12/13/17 1122 by Tawni Millers, CRNA   Charge Capture section accepted

## 2017-12-13 NOTE — Anesthesia Postprocedure Evaluation (Signed)
Anesthesia Post Note  Patient: Alicia [name]  Procedure(s) Performed: INSERTION PORT-A-CATH (Left Chest)     Patient location during evaluation: PACU Anesthesia Type: General Level of consciousness: sedated and patient cooperative Pain management: pain level controlled Vital Signs Assessment: post-procedure vital signs reviewed and stable Respiratory status: spontaneous breathing Cardiovascular status: stable Anesthetic complications: no    Last Vitals:  Vitals:   12/12/17 1200 12/12/17 1300  BP: 120/74 133/72  Pulse: 86 80  Resp: 13 18  Temp:  36.4 C  SpO2: 100% 100%    Last Pain:  Vitals:   12/12/17 1245  TempSrc:   PainSc: 0-No pain                 Nolon Nations

## 2017-12-17 NOTE — Progress Notes (Signed)
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 female who has been referred to [place] by Dr. Allyn Kenner for evaluation and management of enlarged lymph node to left inguinal area.   She is accompanied by her daughter today. She notes that she is doing well overall. She notes that she initially presented with a palpable lump to her left inguinal area for approximately 3-4 weeks. She denies pain to the area, but notes that the area feels larger today. She reports that she typically goes to the doctor twice a year for a medication refill.   She had an [place] on 10/19/17 significant for: IMPRESSION: Multiple prominent masses in the left groin with the largest measuring up to 4.4 cm. These are most likely lymph nodes.   Subsequently, she had a CT AP with contrast completed on 10/26/17 with results of: IMPRESSION: CT evidence of lymphoma, with bulky lymph nodes in multiple nodal stations of the abdomen and pelvis, predominantly left inguinal, left iliac, and the para-aortic nodes. 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   She has a PMHx of HTN and high cholesterol which she takes medications for. She denies a PMHx of DM, cardiac issues, abdominal issues, lung issues, surgeries, or major injuries. She denies smoking cigarettes, drinking ETOH, or chemical exposure.  Her father had a hx of kidney cancer and her mother with lung cancer. Her mother did smoke cigarettes. She denies any other family hx of blood disorders or blood cancers. She has two siblings whom have had either a kidney removal or on dialysis respectively. 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    On review of systems, she reports a slower urinary stream, urinary urgency x several years, resolved diarrhea x yesterday, chronic left hip pain x several years, and chronic right shoulder pain (previously diagnosed with bursitis). She denies her urgency being urgent to need an evaluation. She attributes the diarrhea to something that she ate yesterday. She notes a prior hx of infection to her great toe which she self-treated with Listerine soaks and OTC antifungal creams. She denies abdominal pain, vaginal discharge, fever, chills, night sweats, unexpected weight loss, leg swelling, bowel issues, urinary frequency, vaginal bleeding, foot/toe infection, abdominal trauma, bowel habit changes, prior hx of diverticulitis, lumps anywhere else on the body, and any other symptoms.    INTERVAL HISTORY   Alicia [name] is here for a follow up and to review further workup of inguinal lymphadenopathy with excision LN biopsy. Since last visit she had an excision LN biopsy by surgery results on 11/27/17 with Dr. Barry Dienes which showed grade 8B/1D high grade follicular lymphoma. Her port was placed on 12/12/17.   Today she presents to the clinic accompanied by her family. She notes her biopsy and her port placement went well for her. She expressed she is only going through treatment because her children want her to. She does not want to go through anything that will significantly impact her quality of life. We did the hematology tumor board's recommendation to pursue R-CHOP. We discussed the treatment elements , potential toxicities, need for G-CSF support and f/u and treatment plan in details.  On review of symptoms, pt notes she does not sleep well. She will sleep every few hours at a time with frequently waking up. She has her TV on to sleep. She denies feeling any other palpable lymph nodes. She denies abdominal pain.     MEDICAL HISTORY:  Past Medical History:  Diagnosis Date  . Arthritis     back and hips  . GERD (gastroesophageal reflux disease)    TUMS  . Hypercholesterolemia   . Hypertension   . Non Hodgkin's lymphoma ([place])     SURGICAL HISTORY: Past Surgical History:  Procedure Laterality Date  . LYMPH NODE BIOPSY Left 11/27/2017   Procedure: LEFT INGUINAL LYMPH NODE BIOPSY;  Surgeon: Stark Klein, MD;  Location: [facility];  Service: General;  Laterality: Left;  . PORTACATH PLACEMENT Left 12/12/2017   Procedure: INSERTION PORT-A-CATH;  Surgeon: Stark Klein, MD;  Location: [facility];  Service: General;  Laterality: Left;  . TONSILLECTOMY      SOCIAL HISTORY: Social History   Socioeconomic History  . Marital status: Widowed    Spouse name: Not on file  . Number of children: Not on file  . Years of education: Not on file  . Highest education level: Not on file  Social Needs  . Financial resource strain: Not on file  . Food insecurity - worry: Not on file  . Food insecurity - inability: Not on file  . Transportation needs - medical: Not on file  . Transportation needs - non-medical: Not on file  Occupational History  . Not on file  Tobacco Use  . Smoking status: Never Smoker  . Smokeless tobacco: Never Used  Substance and Sexual Activity  . Alcohol use: No  . Drug use: No  . Sexual activity: Not on file  Other Topics Concern  . Not on file  Social History Narrative  . Not on file    FAMILY HISTORY: No family history on file.  ALLERGIES:  has No Known Allergies.  MEDICATIONS:  Current Outpatient Medications  Medication Sig Dispense Refill  . acetaminophen (TYLENOL) 500 MG tablet Take 500 mg by mouth at bedtime.    . bisoprolol-hydrochlorothiazide (ZIAC) 5-6.25 MG tablet Take 1 tablet by mouth 2 (two) times daily.    Marland Kitchen ezetimibe-simvastatin (VYTORIN) 10-10 MG tablet Take 1 tablet by mouth at bedtime.    . Melatonin 3 MG TABS Take 3 mg by mouth at bedtime.    Marland Kitchen omega-3 acid ethyl esters (LOVAZA) 1 g capsule Take 1 g by mouth daily.       No current facility-administered medications for this visit.     REVIEW OF SYSTEMS:   .10 Point review of Systems was done is negative except as noted above.    PHYSICAL EXAMINATION:  ECOG PERFORMANCE STATUS: 0 - Asymptomatic  . Vitals:   12/18/17 0834  BP: (!) 188/85  Pulse: 68  Resp: 20  Temp: 98.2 F (36.8 C)  SpO2: 97%   Filed Weights   12/18/17 0834  Weight: 133 lb 6.4 oz (60.5 kg)   .Body mass index is 21.53 kg/m.  Marland Kitchen GENERAL:alert, in no acute distress and comfortable SKIN: no acute rashes, no significant lesions EYES: conjunctiva are pink and non-injected, sclera anicteric OROPHARYNX: MMM, no exudates, no oropharyngeal erythema or ulceration NECK: supple, no JVD LYMPH:  no palpable lymphadenopathy in the cervical, axillary regions. Left inguinal Lymphadenoapathy. LUNGS: clear to auscultation b/l with normal respiratory effort, chest PAC - incision healing HEART: regular rate & rhythm ABDOMEN:  normoactive bowel sounds , non tender, not distended.left inguinal surgical incision closed.  Extremity: no pedal edema PSYCH: alert & oriented x 3 with fluent speech NEURO: no focal motor/sensory deficits   LABORATORY DATA:  I have reviewed the data as listed  . CBC Latest Ref Rng & Units 12/18/2017 11/27/2017 11/08/2017  WBC 3.9 - 10.3 K/uL 5.3 6.4 7.4  Hemoglobin 12.0 - 15.0 g/dL - 14.2 14.4  Hematocrit 34.8 - 46.6 % 45.7 43.5 43.5  Platelets 145 - 400 K/uL 161 160 183   . CBC    Component Value Date/Time   WBC 5.3 12/18/2017 0746   WBC 6.4 11/27/2017 0906   RBC 5.03 12/18/2017 0746   RBC 5.03 12/18/2017 0746   HGB 14.2 11/27/2017 0906   HCT 45.7 12/18/2017 0746   PLT 161 12/18/2017 0746   MCV 90.9 12/18/2017 0746   MCH 29.0 12/18/2017 0746   MCHC 31.9 12/18/2017 0746   RDW 13.7 12/18/2017 0746   LYMPHSABS 1.3 12/18/2017 0746   MONOABS 0.6 12/18/2017 0746   EOSABS 0.1 12/18/2017 0746   BASOSABS 0.1 12/18/2017 0746   . CMP Latest Ref Rng & Units  12/18/2017 11/27/2017 11/01/2017  Glucose 70 - 140 mg/dL 76 107(H) 99  BUN 7 - 26 mg/dL 14 16 17   Creatinine 0.60 - 1.10 mg/dL 1.04 0.94 0.97  Sodium 136 - 145 mmol/L 144 145 143  Potassium 3.5 - 5.1 mmol/L 3.8 3.8 3.9  Chloride 98 - 109 mmol/L 106 109 104  CO2 22 - 29 mmol/L 28 25 29   Calcium 8.4 - 10.4 mg/dL 9.9 9.5 10.2  Total Protein 6.4 - 8.3 g/dL 6.6 - 7.7  Total Bilirubin 0.2 - 1.2 mg/dL 0.7 - 0.5  Alkaline Phos 40 - 150 U/L 72 - 83  AST 5 - 34 U/L 17 - 21  ALT 0 - 55 U/L 15 - 17   . Lab Results  Component Value Date   LDH 212 12/18/2017   Component     Latest Ref Rng & Units 11/01/2017  Retic Ct Pct     0.7 - 2.1 % 1.2  RBC.     3.70 - 5.45 MIL/uL 5.18  Retic Count, Absolute     33.7 - 90.7 K/uL 62.2  Hep B Core Ab, Tot     Negative Negative  Hepatitis B Surface Ag     Negative Negative  HCV Ab     0.0 - 0.9 s/co ratio <0.1  HIV Screen 4th Generation wRfx     Non Reactive Non Reactive  Sed Rate     0 - 22 mm/hr 1    PATHOLOGY         :        PROCEDURES  ECHO 11/22/17 Study Conclusions - Left ventricle: The cavity size was normal. Septal wall thickness   was increased in a pattern of moderate LVH with otherwise mild   concentric hypertrophy. Systolic function was normal. The   estimated ejection fraction was in the range of 60% to 65%. Wall   motion was normal; there were no regional wall motion   abnormalities. Doppler parameters are consistent with abnormal   left ventricular relaxation (grade 1 diastolic dysfunction).   Doppler parameters are consistent with indeterminate ventricular   filling pressure. - Aortic valve: Transvalvular velocity was within the normal range.   There was no stenosis. There was mild regurgitation. - Mitral valve: Transvalvular velocity was within the normal range.   There was no evidence  for stenosis. There was no regurgitation. - Right ventricle: The cavity size was normal. Wall thickness was   normal. Systolic  function was normal. - Tricuspid valve: There was mild regurgitation. - Pulmonary arteries: Systolic pressure was within the normal   range. PA peak pressure: 27 mm Hg (S). - Global longitudinal strain -17.7% (normal)    RADIOGRAPHIC STUDIES: I have personally reviewed the radiological images as listed and agreed with the findings in the report. Nm Pet Image Initial (pi) Skull Base To Thigh  Result Date: 11/19/2017 CLINICAL DATA:  Initial treatment strategy for non-Hodgkin's B-cell lymphoma. EXAM: NUCLEAR MEDICINE PET SKULL BASE TO THIGH TECHNIQUE: 6.5 mCi F-18 FDG was injected intravenously. Full-ring PET imaging was performed from the skull base to thigh after the radiotracer. CT data was obtained and used for attenuation correction and anatomic localization. FASTING BLOOD GLUCOSE:  Value: 106 mg/dl COMPARISON:  CT scan 10/26/2017 FINDINGS: NECK Left level Ib lymph node measures 6 mm in short axis on image 24/4 and has a maximum standard uptake value of 3.4 (Deauville 4). CHEST Left supraclavicular lymph node measures 0.7 cm in short axis on image 42/4 and has a maximum standard uptake value of 2.9 (Deauville 4). Left axillary lymph node measures 0.9 cm in short axis on image 54/4 and has a maximum SUV of 4.8 (Deauville 5). Subtle left hilar activity with maximum SUV 2.7 (Deauville 4). Background mediastinal blood pool activity: 1.7. Aortic arch and branch vessel atherosclerotic calcification noted with high position of the aortic arch and mild aortic arch ectasias. Descending thoracic aorta 3.6 cm just past the arch. Mild scarring or atelectasis in the lingula. 3 mm nodular density at the left lung base. ABDOMEN/PELVIS There is notable mesenteric, periaortic/retroperitoneal, bilateral common iliac, bilateral external iliac, and left greater than right inguinal adenopathy along with some faintly hypermetabolic porta hepatis lymph nodes. An index central upper mesenteric lymph node measures 2.0 cm in  short axis on image 121/4 and has maximum SUV 12.1 (Deauville 5). An index left inguinal nodal mass measuring 3.3 cm in short axis has a maximum SUV of 7.6 (Deauville 5). Background liver uptake SUV: 2.2. No focal hypermetabolic lesions involving the liver, spleen, pancreas, or adrenal glands. No splenomegaly. 2 mm left kidney upper pole nonobstructive renal calculus. Aortoiliac atherosclerotic vascular disease. Sigmoid colon diverticulosis. SKELETON No focal hypermetabolic characteristic of skeletal metastasis. 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 Low-grade activity along a right anterior healing fourth rib fracture. IMPRESSION: 1. Bulky retroperitoneal adenopathy with notable mesenteric, bilateral common iliac, bilateral external iliac, and left greater than right inguinal adenopathy, primarily Deauville 5. 2. Several small scattered lymph nodes (primarily Deauville 4) in the left neck and in the chest. However, a left axillary lymph node measures in the dove L5 range. 3. No splenomegaly or abnormal splenic activity. 4. Other imaging findings of potential clinical significance: Aortic Atherosclerosis (ICD10-I70.0). 3 mm nodule in the left lower lobe. 2 mm left kidney upper pole nonobstructive renal calculus. Sigmoid colon diverticulosis. 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 Healing right anterior fourth rib fracture. Electronically Signed   By: Van Clines M.D.   On: 11/19/2017 13:37   Dg Chest Port 1 View  Result Date: 12/12/2017 CLINICAL DATA:  Port-A-Cath placement. EXAM: PORTABLE CHEST 1 VIEW COMPARISON:  None. FINDINGS: Power port placed from a left subclavian approach. The tip is in the SVC 4 cm above the right atrium. No pneumothorax. The lungs remain clear. Heart size is normal. Chronic aortic atherosclerosis and tortuosity. No  significant bone finding. IMPRESSION: Port-A-Cath well-positioned.  No complication. Electronically Signed   By: Nelson Chimes M.D.   On: 12/12/2017 12:43   Dg Fluoro Guide Cv Line-no  Report  Result Date: 12/12/2017 Fluoroscopy was utilized by the requesting physician.  No radiographic interpretation.    ASSESSMENT & PLAN:  77 y.o. is a female with prior hx of HTN and high cholesterol    1. Newly Diagnosed High grade Follicular lymphoma 2. Significant intra-abdominal generalized lymphadenopathy from high grade FL  11/08/17 and 11/27/17 LN Biopsy confirms Non-Hodgkin's B Cell Lymphoma -follicular lymphoma High Grade (grade 3) HIV non reactive No symptoms suggest an intra-abd /pelvic inflammatory process at this time.  Baseline LDH levels normal at 200 (11/01/17)  Port placed on 12/12/17 [facility] with nl EF  Component     Latest Ref Rng & Units 11/01/2017  Hep B Core Ab, Tot     Negative Negative  Hepatitis B Surface Ag     Negative Negative  HCV Ab     0.0 - 0.9 s/co ratio <0.1  HIV Screen 4th Generation wRfx     Non Reactive Non Reactive   We reviewed PET scan from 11/19/17 - with enlarged active lymph nodes primarily in her abdomen and small lymph nodes in her chest and neck, evidence of stage III disease.    PLAN -Discussed with patient her excision LN biopsy by surgery results from 11/27/17 which is positive for high grade follicular Lymphoma -Given her high grade disease I discussed starting treatment with systemic therapy R-CHOP once every 3 weeks for up to 6 cycles. She will take Prednisone for 5 days every 3 weeks.  -all the potential side effects discussed with the pt including fatigue, variable hair loss, drop in blood counts, mouth sores, taste change and neuropathy, cardiomyopathy etc. -I explained I will adjust her treatment if she develops any limiting symptoms.  -I recommend neulasta Onpro patch to reduce drop in white blood counts. I discussed the possible bone pain from neulasta and recommend tylenol or ibuprofen for pain.  -Will get PET scan after 3 cycles to monitor disease response. -I explained the risk of recurrence and the role of maintenance  Rituxan to lower the risk of recurrence keeping her in remission longer.  -I discussed her possible prognosis and what she can expect with and without treatment  -Pt notes her goal is quality of life over quantity of life.  -I answered all her questions to her understanding and satisfaction -Today she is overall asymptomatic with no constitutional symptoms -I encouraged her to remain active and maintain a healthy diet and drink plenty of water.  -Baseline ECHO from 11/22/17 - with LV EF of 60-65% -Chemo education class within a week   3. HTN -She is on HTN medication PLAN  -Given her previous drop of BP I advised her to continue to closely monitor her BP at home and for her to contact her PCP   4. Interrupted Sleep Cycle  -She has tried melatonin and Tylenol PM -Still gets 6 hours of sleep  -If needed I will provide sleep aid as Prednisone can effect her sleep during treatment.    Chemo-counseling for R-CHOP with neulasta in 5-7 days Schedule to Start R-CHOP in 7 days with labs RTC with Dr Irene Limbo in 10 days post R-CHOP C1 with labs for toxicity check  All of the patients questions were answered with apparent satisfaction. The patient knows to call the clinic with any problems, questions or concerns.  Marland Kitchen  The total time spent in the appointment was 40 minutes and more than 50% was on counseling and direct patient cares.      Sullivan Lone MD [place] AAHIVMS [facility] Hematology/Oncology Physician [facility]  (Office):       [phone] (Work cell):  [phone] (Fax):           [phone]  12/18/2017 9:23 AM   This document serves as a record of services personally performed by Sullivan Lone, MD. It was created on his behalf by Joslyn Devon, a trained medical scribe. The creation of this record is based on the scribe's personal observations and the provider's statements to them.    .I have reviewed the above documentation for accuracy and completeness, and I agree with  the above. Brunetta Genera MD MS

## 2017-12-18 ENCOUNTER — Inpatient Hospital Stay: Payer: Medicare HMO | Attending: Hematology | Admitting: Hematology

## 2017-12-18 ENCOUNTER — Telehealth: Payer: Self-pay | Admitting: Hematology

## 2017-12-18 ENCOUNTER — Inpatient Hospital Stay: Payer: Medicare HMO

## 2017-12-18 VITALS — BP 188/85 | HR 68 | Temp 98.2°F | Resp 20 | Ht 66.0 in | Wt 133.4 lb

## 2017-12-18 DIAGNOSIS — R69 Illness, unspecified: Secondary | ICD-10-CM | POA: Diagnosis not present

## 2017-12-18 DIAGNOSIS — Z7189 Other specified counseling: Secondary | ICD-10-CM

## 2017-12-18 DIAGNOSIS — G479 Sleep disorder, unspecified: Secondary | ICD-10-CM | POA: Insufficient documentation

## 2017-12-18 DIAGNOSIS — K5900 Constipation, unspecified: Secondary | ICD-10-CM | POA: Diagnosis not present

## 2017-12-18 DIAGNOSIS — Z5112 Encounter for antineoplastic immunotherapy: Secondary | ICD-10-CM | POA: Diagnosis present

## 2017-12-18 DIAGNOSIS — I10 Essential (primary) hypertension: Secondary | ICD-10-CM | POA: Diagnosis not present

## 2017-12-18 DIAGNOSIS — C8285 Other types of follicular lymphoma, lymph nodes of inguinal region and lower limb: Secondary | ICD-10-CM | POA: Diagnosis not present

## 2017-12-18 DIAGNOSIS — C8598 Non-Hodgkin lymphoma, unspecified, lymph nodes of multiple sites: Secondary | ICD-10-CM

## 2017-12-18 DIAGNOSIS — Z5111 Encounter for antineoplastic chemotherapy: Secondary | ICD-10-CM | POA: Insufficient documentation

## 2017-12-18 DIAGNOSIS — F419 Anxiety disorder, unspecified: Secondary | ICD-10-CM | POA: Diagnosis not present

## 2017-12-18 DIAGNOSIS — R599 Enlarged lymph nodes, unspecified: Secondary | ICD-10-CM | POA: Diagnosis present

## 2017-12-18 DIAGNOSIS — C8238 Follicular lymphoma grade IIIa, lymph nodes of multiple sites: Secondary | ICD-10-CM

## 2017-12-18 LAB — CBC WITH DIFFERENTIAL (CANCER CENTER ONLY)
Basophils Absolute: 0.1 10*3/uL (ref 0.0–0.1)
Basophils Relative: 1 %
EOS ABS: 0.1 10*3/uL (ref 0.0–0.5)
EOS PCT: 2 %
HCT: 45.7 % (ref 34.8–46.6)
Hemoglobin: 14.6 g/dL (ref 11.6–15.9)
LYMPHS ABS: 1.3 10*3/uL (ref 0.9–3.3)
LYMPHS PCT: 25 %
MCH: 29 pg (ref 25.1–34.0)
MCHC: 31.9 g/dL (ref 31.5–36.0)
MCV: 90.9 fL (ref 79.5–101.0)
MONOS PCT: 11 %
Monocytes Absolute: 0.6 10*3/uL (ref 0.1–0.9)
Neutro Abs: 3.3 10*3/uL (ref 1.5–6.5)
Neutrophils Relative %: 61 %
PLATELETS: 161 10*3/uL (ref 145–400)
RBC: 5.03 MIL/uL (ref 3.70–5.45)
RDW: 13.7 % (ref 11.2–14.5)
WBC: 5.3 10*3/uL (ref 3.9–10.3)

## 2017-12-18 LAB — CMP (CANCER CENTER ONLY)
ALBUMIN: 3.7 g/dL (ref 3.5–5.0)
ALT: 15 U/L (ref 0–55)
AST: 17 U/L (ref 5–34)
Alkaline Phosphatase: 72 U/L (ref 40–150)
Anion gap: 10 (ref 3–11)
BUN: 14 mg/dL (ref 7–26)
CHLORIDE: 106 mmol/L (ref 98–109)
CO2: 28 mmol/L (ref 22–29)
Calcium: 9.9 mg/dL (ref 8.4–10.4)
Creatinine: 1.04 mg/dL (ref 0.60–1.10)
GFR, Est AFR Am: 59 mL/min — ABNORMAL LOW (ref >60)
GFR, Estimated: 50 mL/min — ABNORMAL LOW (ref >60)
GLUCOSE: 76 mg/dL (ref 70–140)
Potassium: 3.8 mmol/L (ref 3.5–5.1)
SODIUM: 144 mmol/L (ref 136–145)
Total Bilirubin: 0.7 mg/dL (ref 0.2–1.2)
Total Protein: 6.6 g/dL (ref 6.4–8.3)

## 2017-12-18 LAB — RETICULOCYTES
RBC.: 5.03 MIL/uL (ref 3.70–5.45)
RETIC CT PCT: 1.3 % (ref 0.7–2.1)
Retic Count, Absolute: 65.4 10*3/uL (ref 33.7–90.7)

## 2017-12-18 LAB — LACTATE DEHYDROGENASE: LDH: 212 U/L (ref 125–245)

## 2017-12-18 NOTE — Telephone Encounter (Signed)
Scheduled appt per 3/5 los - Gave patient AVS and calender per los.  

## 2017-12-20 ENCOUNTER — Other Ambulatory Visit: Payer: Self-pay | Admitting: Hematology

## 2017-12-20 ENCOUNTER — Encounter: Payer: Self-pay | Admitting: *Deleted

## 2017-12-20 ENCOUNTER — Inpatient Hospital Stay: Payer: Medicare HMO

## 2017-12-20 DIAGNOSIS — R7301 Impaired fasting glucose: Secondary | ICD-10-CM | POA: Diagnosis not present

## 2017-12-20 DIAGNOSIS — R739 Hyperglycemia, unspecified: Secondary | ICD-10-CM | POA: Diagnosis not present

## 2017-12-20 DIAGNOSIS — C8238 Follicular lymphoma grade IIIa, lymph nodes of multiple sites: Secondary | ICD-10-CM

## 2017-12-20 DIAGNOSIS — E782 Mixed hyperlipidemia: Secondary | ICD-10-CM | POA: Diagnosis not present

## 2017-12-20 DIAGNOSIS — Z7189 Other specified counseling: Secondary | ICD-10-CM | POA: Insufficient documentation

## 2017-12-20 MED ORDER — PROCHLORPERAZINE MALEATE 10 MG PO TABS: 10.0000 mg | ORAL_TABLET | Freq: Four times a day (QID) | ORAL | 6 refills | Status: DC | PRN

## 2017-12-20 MED ORDER — ALLOPURINOL 300 MG PO TABS: 150.0000 mg | ORAL_TABLET | Freq: Every day | ORAL | 0 refills | Status: DC

## 2017-12-20 MED ORDER — PREDNISONE 20 MG PO TABS: 60.0000 mg | ORAL_TABLET | Freq: Every day | ORAL | 5 refills | Status: DC

## 2017-12-20 MED ORDER — LIDOCAINE-PRILOCAINE 2.5-2.5 % EX CREA: TOPICAL_CREAM | CUTANEOUS | 3 refills | Status: DC

## 2017-12-20 MED ORDER — ONDANSETRON HCL 8 MG PO TABS: 8.0000 mg | ORAL_TABLET | Freq: Two times a day (BID) | ORAL | 1 refills | Status: DC | PRN

## 2017-12-20 MED ORDER — LORAZEPAM 0.5 MG PO TABS: 0.5000 mg | ORAL_TABLET | Freq: Four times a day (QID) | ORAL | 0 refills | Status: DC | PRN

## 2017-12-20 NOTE — Progress Notes (Signed)
START ON PATHWAY REGIMEN - Lymphoma and CLL     A cycle is every 21 days:     Rituximab      Cyclophosphamide      Doxorubicin      Vincristine      Prednisone   **Always confirm dose/schedule in your pharmacy ordering system**    Patient Characteristics: Follicular Lymphoma, Grade 3B, First Line, Stage III / IV Disease Type: Follicular Lymphoma Disease Type: Not Applicable Disease Type: Not Applicable [place] Stage: III Tumor Grade: 3B Line of therapy: First Line Intent of Therapy: Non-Curative / Palliative Intent, Discussed with Patient

## 2017-12-24 DIAGNOSIS — I10 Essential (primary) hypertension: Secondary | ICD-10-CM | POA: Diagnosis not present

## 2017-12-24 DIAGNOSIS — Z6821 Body mass index (BMI) 21.0-21.9, adult: Secondary | ICD-10-CM | POA: Diagnosis not present

## 2017-12-24 DIAGNOSIS — E782 Mixed hyperlipidemia: Secondary | ICD-10-CM | POA: Diagnosis not present

## 2017-12-24 DIAGNOSIS — R69 Illness, unspecified: Secondary | ICD-10-CM | POA: Diagnosis not present

## 2017-12-24 DIAGNOSIS — R7301 Impaired fasting glucose: Secondary | ICD-10-CM | POA: Diagnosis not present

## 2017-12-24 DIAGNOSIS — C8510 Unspecified B-cell lymphoma, unspecified site: Secondary | ICD-10-CM | POA: Diagnosis not present

## 2017-12-26 ENCOUNTER — Encounter: Payer: Self-pay | Admitting: Hematology

## 2017-12-26 NOTE — Progress Notes (Signed)
Submitted PA for Lidocaine/Prilocaine cream via Cover My Meds:  Lorrene Graef (Key: Nessa.Cuff) [id number]  Need help? Call us at [phone]   Status  Sent to [employer]  Next Steps  The plan will fax you a determination, typically within 1 to 5 business days. How do I follow up?  DrugLidocaine-Prilocaine 2.5-2.5% cream  [facility] Coverage Determination FormPrior Authorization for Medicare [phone]  Original Claim [id number] PRECERT REQ BY MD [phone] REQUIRES PRIOR AUTHORIZATION

## 2017-12-28 ENCOUNTER — Inpatient Hospital Stay: Payer: Medicare HMO

## 2017-12-28 ENCOUNTER — Other Ambulatory Visit: Payer: Self-pay | Admitting: Hematology

## 2017-12-28 ENCOUNTER — Telehealth: Payer: Self-pay

## 2017-12-28 VITALS — BP 138/68 | HR 64 | Temp 98.3°F | Resp 16

## 2017-12-28 DIAGNOSIS — Z7189 Other specified counseling: Secondary | ICD-10-CM

## 2017-12-28 DIAGNOSIS — C8238 Follicular lymphoma grade IIIa, lymph nodes of multiple sites: Secondary | ICD-10-CM

## 2017-12-28 DIAGNOSIS — Z5112 Encounter for antineoplastic immunotherapy: Secondary | ICD-10-CM | POA: Diagnosis not present

## 2017-12-28 LAB — CBC WITH DIFFERENTIAL/PLATELET
BASOS ABS: 0.1 10*3/uL (ref 0.0–0.1)
Basophils Relative: 1 %
EOS ABS: 0.1 10*3/uL (ref 0.0–0.5)
EOS PCT: 2 %
HCT: 42.7 % (ref 34.8–46.6)
HEMOGLOBIN: 13.9 g/dL (ref 11.6–15.9)
LYMPHS ABS: 1.4 10*3/uL (ref 0.9–3.3)
LYMPHS PCT: 28 %
MCH: 29.4 pg (ref 25.1–34.0)
MCHC: 32.6 g/dL (ref 31.5–36.0)
MCV: 90.5 fL (ref 79.5–101.0)
Monocytes Absolute: 0.4 10*3/uL (ref 0.1–0.9)
Monocytes Relative: 8 %
NEUTROS PCT: 61 %
Neutro Abs: 3 10*3/uL (ref 1.5–6.5)
PLATELETS: 152 10*3/uL (ref 145–400)
RBC: 4.72 MIL/uL (ref 3.70–5.45)
RDW: 13.9 % (ref 11.2–14.5)
WBC: 5 10*3/uL (ref 3.9–10.3)

## 2017-12-28 LAB — CMP (CANCER CENTER ONLY)
ALT: 15 U/L (ref 0–55)
AST: 19 U/L (ref 5–34)
Albumin: 3.7 g/dL (ref 3.5–5.0)
Alkaline Phosphatase: 71 U/L (ref 40–150)
Anion gap: 9 (ref 3–11)
BILIRUBIN TOTAL: 0.7 mg/dL (ref 0.2–1.2)
BUN: 14 mg/dL (ref 7–26)
CHLORIDE: 107 mmol/L (ref 98–109)
CO2: 28 mmol/L (ref 22–29)
CREATININE: 1.08 mg/dL (ref 0.60–1.10)
Calcium: 10 mg/dL (ref 8.4–10.4)
GFR, EST AFRICAN AMERICAN: 56 mL/min — AB (ref >60)
GFR, EST NON AFRICAN AMERICAN: 48 mL/min — AB (ref >60)
Glucose, Bld: 114 mg/dL (ref 70–140)
POTASSIUM: 3.8 mmol/L (ref 3.5–5.1)
Sodium: 144 mmol/L (ref 136–145)
TOTAL PROTEIN: 6.6 g/dL (ref 6.4–8.3)

## 2017-12-28 LAB — RETICULOCYTES
RBC.: 4.72 MIL/uL (ref 3.70–5.45)
RETIC COUNT ABSOLUTE: 66.1 10*3/uL (ref 33.7–90.7)
RETIC CT PCT: 1.4 % (ref 0.7–2.1)

## 2017-12-28 LAB — LACTATE DEHYDROGENASE: LDH: 215 U/L (ref 125–245)

## 2017-12-28 MED ORDER — ACETAMINOPHEN 325 MG PO TABS
ORAL_TABLET | ORAL | Status: AC
  Filled 2017-12-28: qty 2

## 2017-12-28 MED ORDER — DOXORUBICIN HCL CHEMO IV INJECTION 2 MG/ML
50.0000 mg/m2 | Freq: Once | INTRAVENOUS | Status: AC
  Administered 2017-12-28: 84 mg via INTRAVENOUS
  Filled 2017-12-28: qty 42

## 2017-12-28 MED ORDER — ACETAMINOPHEN 325 MG PO TABS
650.0000 mg | ORAL_TABLET | Freq: Once | ORAL | Status: AC
  Administered 2017-12-28: 650 mg via ORAL

## 2017-12-28 MED ORDER — DEXAMETHASONE SODIUM PHOSPHATE 10 MG/ML IJ SOLN
10.0000 mg | Freq: Once | INTRAMUSCULAR | Status: AC
  Administered 2017-12-28: 10 mg via INTRAVENOUS

## 2017-12-28 MED ORDER — VINCRISTINE SULFATE CHEMO INJECTION 1 MG/ML
2.0000 mg | Freq: Once | INTRAVENOUS | Status: AC
  Administered 2017-12-28: 2 mg via INTRAVENOUS
  Filled 2017-12-28: qty 2

## 2017-12-28 MED ORDER — PEGFILGRASTIM 6 MG/0.6ML ~~LOC~~ PSKT: 6.0000 mg | PREFILLED_SYRINGE | Freq: Once | SUBCUTANEOUS | Status: DC

## 2017-12-28 MED ORDER — DEXAMETHASONE SODIUM PHOSPHATE 100 MG/10ML IJ SOLN: 10.0000 mg | Freq: Once | INTRAMUSCULAR | Status: DC

## 2017-12-28 MED ORDER — DIPHENHYDRAMINE HCL 25 MG PO CAPS
50.0000 mg | ORAL_CAPSULE | Freq: Once | ORAL | Status: AC
  Administered 2017-12-28: 50 mg via ORAL

## 2017-12-28 MED ORDER — DIPHENHYDRAMINE HCL 25 MG PO CAPS
ORAL_CAPSULE | ORAL | Status: AC
  Filled 2017-12-28: qty 2

## 2017-12-28 MED ORDER — PALONOSETRON HCL INJECTION 0.25 MG/5ML
INTRAVENOUS | Status: AC
  Filled 2017-12-28: qty 5

## 2017-12-28 MED ORDER — RITUXIMAB CHEMO INJECTION 500 MG/50ML
375.0000 mg/m2 | Freq: Once | INTRAVENOUS | Status: AC
  Administered 2017-12-28: 600 mg via INTRAVENOUS
  Filled 2017-12-28: qty 50

## 2017-12-28 MED ORDER — PALONOSETRON HCL INJECTION 0.25 MG/5ML
0.2500 mg | Freq: Once | INTRAVENOUS | Status: AC
  Administered 2017-12-28: 0.25 mg via INTRAVENOUS

## 2017-12-28 MED ORDER — SODIUM CHLORIDE 0.9% FLUSH
10.0000 mL | INTRAVENOUS | Status: DC | PRN
  Filled 2017-12-28: qty 10

## 2017-12-28 MED ORDER — HEPARIN SOD (PORK) LOCK FLUSH 100 UNIT/ML IV SOLN
500.0000 [IU] | Freq: Once | INTRAVENOUS | Status: DC | PRN
  Filled 2017-12-28: qty 5

## 2017-12-28 MED ORDER — SODIUM CHLORIDE 0.9 % IV SOLN
750.0000 mg/m2 | Freq: Once | INTRAVENOUS | Status: AC
  Administered 2017-12-28: 1260 mg via INTRAVENOUS
  Filled 2017-12-28: qty 63

## 2017-12-28 MED ORDER — SODIUM CHLORIDE 0.9 % IV SOLN
Freq: Once | INTRAVENOUS | Status: AC
  Administered 2017-12-28: 09:00:00 via INTRAVENOUS

## 2017-12-28 MED ORDER — DEXAMETHASONE SODIUM PHOSPHATE 10 MG/ML IJ SOLN
INTRAMUSCULAR | Status: AC
  Filled 2017-12-28: qty 1

## 2017-12-28 NOTE — Telephone Encounter (Signed)
Pt requested for neulasta injection to be given at [facility] d/t commute from [place] to [facility]. Haley at [facility] scheduled pt for 0900 on 12/31/17. Called pt to confirm appt and cancellation of appt at [facility] on Monday morning. Pt verbalized understanding and thanks for the communication. Order being changed from onpro to neulasta injection.

## 2017-12-28 NOTE — Patient Instructions (Signed)
[facility] Discharge Instructions for Patients Receiving Chemotherapy  Today you received the following chemotherapy agents rituxan/adriamycin/vincristine/cytoxan.   To help prevent nausea and vomiting after your treatment, we encourage you to take your nausea medication as directed.    If you develop nausea and vomiting that is not controlled by your nausea medication, call the clinic.   BELOW ARE SYMPTOMS THAT SHOULD BE REPORTED IMMEDIATELY:  *FEVER GREATER THAN 100.5 F  *CHILLS WITH OR WITHOUT FEVER  NAUSEA AND VOMITING THAT IS NOT CONTROLLED WITH YOUR NAUSEA MEDICATION  *UNUSUAL SHORTNESS OF BREATH  *UNUSUAL BRUISING OR BLEEDING  TENDERNESS IN MOUTH AND THROAT WITH OR WITHOUT PRESENCE OF ULCERS  *URINARY PROBLEMS  *BOWEL PROBLEMS  UNUSUAL RASH Items with * indicate a potential emergency and should be followed up as soon as possible.  Feel free to call the clinic you have any questions or concerns. The clinic phone number is [phone].  

## 2017-12-31 ENCOUNTER — Encounter (HOSPITAL_COMMUNITY): Payer: Self-pay

## 2017-12-31 ENCOUNTER — Encounter: Payer: Self-pay | Admitting: Hematology

## 2017-12-31 ENCOUNTER — Inpatient Hospital Stay (HOSPITAL_COMMUNITY): Payer: Medicare HMO | Attending: Internal Medicine

## 2017-12-31 ENCOUNTER — Ambulatory Visit: Payer: Medicare HMO

## 2017-12-31 ENCOUNTER — Telehealth: Payer: Self-pay | Admitting: *Deleted

## 2017-12-31 VITALS — BP 147/64 | HR 56 | Temp 98.0°F | Resp 16

## 2017-12-31 DIAGNOSIS — C8285 Other types of follicular lymphoma, lymph nodes of inguinal region and lower limb: Secondary | ICD-10-CM | POA: Diagnosis not present

## 2017-12-31 DIAGNOSIS — Z5189 Encounter for other specified aftercare: Secondary | ICD-10-CM | POA: Insufficient documentation

## 2017-12-31 DIAGNOSIS — C8238 Follicular lymphoma grade IIIa, lymph nodes of multiple sites: Secondary | ICD-10-CM

## 2017-12-31 DIAGNOSIS — Z7189 Other specified counseling: Secondary | ICD-10-CM

## 2017-12-31 MED ORDER — PEGFILGRASTIM INJECTION 6 MG/0.6ML ~~LOC~~
6.0000 mg | PREFILLED_SYRINGE | Freq: Once | SUBCUTANEOUS | Status: AC
  Administered 2017-12-31: 6 mg via SUBCUTANEOUS
  Filled 2017-12-31: qty 0.6

## 2017-12-31 NOTE — Progress Notes (Unsigned)
Received denial for Lidocaine/Prilocaine cream.

## 2017-12-31 NOTE — Patient Instructions (Signed)
[place] at [facility]  Discharge Instructions:  Pegfilgrastim injection What is this medicine? PEGFILGRASTIM (PEG fil gra stim) is a long-acting granulocyte colony-stimulating factor that stimulates the growth of neutrophils, a type of white blood cell important in the body's fight against infection. It is used to reduce the incidence of fever and infection in patients with certain types of cancer who are receiving chemotherapy that affects the bone marrow, and to increase survival after being exposed to high doses of radiation. This medicine may be used for other purposes; ask your health care provider or pharmacist if you have questions. COMMON BRAND NAME(S): Neulasta What should I tell my health care provider before I take this medicine? They need to know if you have any of these conditions: -kidney disease -latex allergy -ongoing radiation therapy -sickle cell disease -skin reactions to acrylic adhesives (On-Body Injector only) -an unusual or allergic reaction to pegfilgrastim, filgrastim, other medicines, foods, dyes, or preservatives -pregnant or trying to get pregnant -breast-feeding How should I use this medicine? This medicine is for injection under the skin. If you get this medicine at home, you will be taught how to prepare and give the pre-filled syringe or how to use the On-body Injector. Refer to the patient Instructions for Use for detailed instructions. Use exactly as directed. Tell your healthcare provider immediately if you suspect that the On-body Injector may not have performed as intended or if you suspect the use of the On-body Injector resulted in a missed or partial dose. It is important that you put your used needles and syringes in a special sharps container. Do not put them in a trash can. If you do not have a sharps container, call your pharmacist or healthcare provider to get one. Talk to your pediatrician regarding the use of this  medicine in children. While this drug may be prescribed for selected conditions, precautions do apply. Overdosage: If you think you have taken too much of this medicine contact a poison control center or emergency room at once. NOTE: This medicine is only for you. Do not share this medicine with others. What if I miss a dose? It is important not to miss your dose. Call your doctor or health care professional if you miss your dose. If you miss a dose due to an On-body Injector failure or leakage, a new dose should be administered as soon as possible using a single prefilled syringe for manual use. What may interact with this medicine? Interactions have not been studied. Give your health care provider a list of all the medicines, herbs, non-prescription drugs, or dietary supplements you use. Also tell them if you smoke, drink alcohol, or use illegal drugs. Some items may interact with your medicine. This list may not describe all possible interactions. Give your health care provider a list of all the medicines, herbs, non-prescription drugs, or dietary supplements you use. Also tell them if you smoke, drink alcohol, or use illegal drugs. Some items may interact with your medicine. What should I watch for while using this medicine? You may need blood work done while you are taking this medicine. If you are going to need a MRI, CT scan, or other procedure, tell your doctor that you are using this medicine (On-Body Injector only). What side effects may I notice from receiving this medicine? Side effects that you should report to your doctor or health care professional as soon as possible: -allergic reactions like skin rash, itching or hives, swelling of the  face, lips, or tongue -dizziness -fever -pain, redness, or irritation at site where injected -pinpoint red spots on the skin -red or dark-brown urine -shortness of breath or breathing problems -stomach or side pain, or pain at the  shoulder -swelling -tiredness -trouble passing urine or change in the amount of urine Side effects that usually do not require medical attention (report to your doctor or health care professional if they continue or are bothersome): -bone pain -muscle pain This list may not describe all possible side effects. Call your doctor for medical advice about side effects. You may report side effects to FDA at 1-800-FDA-1088. Where should I keep my medicine? Keep out of the reach of children. Store pre-filled syringes in a refrigerator between 2 and 8 degrees C (36 and 46 degrees F). Do not freeze. Keep in carton to protect from light. Throw away this medicine if it is left out of the refrigerator for more than 48 hours. Throw away any unused medicine after the expiration date. NOTE: This sheet is a summary. It may not cover all possible information. If you have questions about this medicine, talk to your doctor, pharmacist, or health care provider.  2018 Elsevier/Gold Standard (2016-09-28 12:58:03)  _______________________________________________________________  Thank you for choosing [place] at [facility] to provide your oncology and hematology care.  To afford each patient quality time with our providers, please arrive at least 15 minutes before your scheduled appointment.  You need to re-schedule your appointment if you arrive 10 or more minutes late.  We strive to give you quality time with our providers, and arriving late affects you and other patients whose appointments are after yours.  Also, if you no show three or more times for appointments you may be dismissed from the clinic.  Again, thank you for choosing [place] [facility] [place] hope is that these requests will allow you access to exceptional care and in a timely manner. _______________________________________________________________  If you have questions after your visit, please  contact our office at [phone] between the hours of 8:30 a.m. and 5:00 p.m. Voicemails left after 4:30 p.m. will not be returned until the following business day. _______________________________________________________________  For prescription refill requests, have your pharmacy contact our office. _______________________________________________________________  Recommendations made by the consultant and any test results will be sent to your referring physician. _______________________________________________________________

## 2017-12-31 NOTE — Telephone Encounter (Signed)
LVM with patient for chemo follow-up.  Advised pt to call clinic with any concerns including N/V, Fever/Chills, Constipation/Diarrhea, urinary issues.  Call back number provided.

## 2017-12-31 NOTE — Progress Notes (Signed)
Received denial for Lidocaine-Prilocaine cream.  Called [employer](Courtney) to complete an appeal. She states decision will be made within 7 days.

## 2017-12-31 NOTE — Progress Notes (Signed)
To treatment area for neulasta shot following chemotherapy at [facility].  Patient denied fevers or chills over the weekend.  Started miralax this morning to prevent constipation.  Her last bowel movement was Friday morning.  Stated left shoulder discomfort up into neck and feels like she slept funny on Sunday.  Reviewed interventions for pain relief from Neulasta shot.  All questions asked and answered.  VSS with discharge and left ambulatory with no s/s of distress noted.

## 2018-01-01 ENCOUNTER — Encounter: Payer: Self-pay | Admitting: Hematology

## 2018-01-01 NOTE — Progress Notes (Signed)
Pt's drug appeal for Lidocaine/Prilocaine is approved until 04/02/18.

## 2018-01-02 NOTE — Progress Notes (Signed)
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 female who has been referred to [place] by Dr. Allyn Kenner for evaluation and management of enlarged lymph node to left inguinal area.   She is accompanied by her daughter today. She notes that she is doing well overall. She notes that she initially presented with a palpable lump to her left inguinal area for approximately 3-4 weeks. She denies pain to the area, but notes that the area feels larger today. She reports that she typically goes to the doctor twice a year for a medication refill.   She had an [place] on 10/19/17 significant for: IMPRESSION: Multiple prominent masses in the left groin with the largest measuring up to 4.4 cm. These are most likely lymph nodes.   Subsequently, she had a CT AP with contrast completed on 10/26/17 with results of: IMPRESSION: CT evidence of lymphoma, with bulky lymph nodes in multiple nodal stations of the abdomen and pelvis, predominantly left inguinal, left iliac, and the para-aortic nodes. 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   She has a PMHx of HTN and high cholesterol which she takes medications for. She denies a PMHx of DM, cardiac issues, abdominal issues, lung issues, surgeries, or major injuries. She denies smoking cigarettes, drinking ETOH, or chemical exposure.  Her father had a hx of kidney cancer and her mother with lung cancer. Her mother did smoke cigarettes. She denies any other family hx of blood disorders or blood cancers. She has two siblings whom have had either a kidney removal or on dialysis respectively. 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    On review of systems, she reports a slower urinary stream, urinary urgency x several years, resolved diarrhea x yesterday, chronic left hip pain x several years, and chronic right shoulder pain (previously diagnosed with bursitis). She denies her urgency being urgent to need an evaluation. She attributes the diarrhea to something that she ate yesterday. She notes a prior hx of infection to her great toe which she self-treated with Listerine soaks and OTC antifungal creams. She denies abdominal pain, vaginal discharge, fever, chills, night sweats, unexpected weight loss, leg swelling, bowel issues, urinary frequency, vaginal bleeding, foot/toe infection, abdominal trauma, bowel habit changes, prior hx of diverticulitis, lumps anywhere else on the body, and any other symptoms.    INTERVAL HISTORY   Alicia [name] is here for a follow up and continued management of her high grade follicular lymphadenopathy and toxicity check after 1st cycle of R-CHOP  The patient's last visit with [place] was on 12/18/17. She is accompanied today by her daughter. The pt reports that she is doing well overall.   The pt reports that she is eating well, but has not had a bowel movement in 4 days and is not sleeping well. She has not felt backed up, and notes no GI discomfort. She has taken Miralax, prune juice, and has used stool softeners. She notes some occasional anxiety and has not used her Ativan because she thought this was just for her nausea. She notes no other problems with her treatment, and notes a decrease in her  inguinal lymph node swelling. She has had her Neulasta shot without any problems.   Lab results today (01/03/18) of CBC, CMP, and Reticulocytes is as follows: all values are WNL except for Platelets at 90k, Monocytes at 0.0k, Total Protein at 6.3, and Retic Ct Pct at <0.4%.  On review of systems, pt reports eating well, sore throat, lack of sleep, intermittent anxiety,  constipation, and denies fevers, chills, abdominal discomfort or pain.   MEDICAL HISTORY:  Past Medical History:  Diagnosis Date  . Arthritis    back and hips  . GERD (gastroesophageal reflux disease)    TUMS  . Hypercholesterolemia   . Hypertension   . Non Hodgkin's lymphoma ([place])     SURGICAL HISTORY: Past Surgical History:  Procedure Laterality Date  . LYMPH NODE BIOPSY Left 11/27/2017   Procedure: LEFT INGUINAL LYMPH NODE BIOPSY;  Surgeon: Stark Klein, MD;  Location: [place];  Service: General;  Laterality: Left;  . PORTACATH PLACEMENT Left 12/12/2017   Procedure: INSERTION PORT-A-CATH;  Surgeon: Stark Klein, MD;  Location: [place];  Service: General;  Laterality: Left;  . TONSILLECTOMY      SOCIAL HISTORY: Social History   Socioeconomic History  . Marital status: Widowed    Spouse name: Not on file  . Number of children: Not on file  . Years of education: Not on file  . Highest education level: Not on file  Occupational History  . Not on file  Social Needs  . Financial resource strain: Not on file  . Food insecurity:    Worry: Not on file    Inability: Not on file  . Transportation needs:    Medical: Not on file    Non-medical: Not on file  Tobacco Use  . Smoking status: Never Smoker  . Smokeless tobacco: Never Used  Substance and Sexual Activity  . Alcohol use: No  . Drug use: No  . Sexual activity: Not on file  Lifestyle  . Physical activity:    Days per week: Not on file    Minutes per session: Not on file  . Stress: Not on file  Relationships  . Social connections:    Talks on phone: Not on file    Gets together: Not on file    Attends religious service: Not on file    Active member of club or organization: Not on file    Attends meetings of clubs or organizations: Not on file    Relationship status: Not on file  . Intimate partner violence:    Fear of current or ex partner: Not on file    Emotionally abused: Not on file     Physically abused: Not on file    Forced sexual activity: Not on file  Other Topics Concern  . Not on file  Social History Narrative  . Not on file    FAMILY HISTORY: No family history on file.  ALLERGIES:  has No Known Allergies.  MEDICATIONS:  Current Outpatient Medications  Medication Sig Dispense Refill  . acetaminophen (TYLENOL) 500 MG tablet Take 500 mg by mouth at bedtime.    Marland Kitchen allopurinol (ZYLOPRIM) 300 MG tablet Take 0.5 tablets (150 mg total) by mouth daily. 15 tablet 0  . bisoprolol-hydrochlorothiazide (ZIAC) 5-6.25 MG tablet Take 1 tablet by mouth 2 (two) times daily.    Marland Kitchen ezetimibe-simvastatin (VYTORIN) 10-10 MG tablet Take 1 tablet by mouth at bedtime.    . lidocaine-prilocaine (EMLA) cream Apply to affected area once 30 g 3  .  LORazepam (ATIVAN) 0.5 MG tablet Take 1 tablet (0.5 mg total) by mouth every 6 (six) hours as needed (Nausea or vomiting). 30 tablet 0  . Melatonin 3 MG TABS Take 3 mg by mouth at bedtime.    Marland Kitchen omega-3 acid ethyl esters (LOVAZA) 1 g capsule Take 1 g by mouth daily.     . ondansetron (ZOFRAN) 8 MG tablet Take 1 tablet (8 mg total) by mouth 2 (two) times daily as needed for refractory nausea / vomiting. From day 3 after chemotherapy. 30 tablet 1  . predniSONE (DELTASONE) 20 MG tablet Take 3 tablets (60 mg total) by mouth daily. Take on days 1-5 of chemotherapy. 15 tablet 5  . prochlorperazine (COMPAZINE) 10 MG tablet Take 1 tablet (10 mg total) by mouth every 6 (six) hours as needed (Nausea or vomiting). 30 tablet 6   No current facility-administered medications for this visit.     REVIEW OF SYSTEMS:   .10 Point review of Systems was done is negative except as noted above.   PHYSICAL EXAMINATION:  ECOG PERFORMANCE STATUS: 0 - Asymptomatic  . Vitals:   01/03/18 1354  BP: 140/80  Pulse: 76  Resp: 18  Temp: 98.5 F (36.9 C)  SpO2: 99%   Filed Weights   01/03/18 1354  Weight: 132 lb 6.4 oz (60.1 kg)   .Body mass index is 21.37  kg/m.  GENERAL:alert, in no acute distress and comfortable SKIN: no acute rashes, no significant lesions EYES: conjunctiva are pink and non-injected, sclera anicteric OROPHARYNX: MMM, no exudates, no oropharyngeal erythema or ulceration NECK: supple, no JVD LYMPH:  no palpable lymphadenopathy in the cervical, axillary or inguinal regions LUNGS: clear to auscultation b/l with normal respiratory effort HEART: regular rate & rhythm ABDOMEN:  normoactive bowel sounds , non tender, not distended. Extremity: no pedal edema PSYCH: alert & oriented x 3 with fluent speech NEURO: no focal motor/sensory deficits     LABORATORY DATA:  I have reviewed the data as listed  . CBC Latest Ref Rng & Units 01/03/2018 12/28/2017 12/18/2017  WBC 3.9 - 10.3 K/uL 5.8 5.0 5.3  Hemoglobin 11.6 - 15.9 g/dL 13.6 13.9 -  Hematocrit 34.8 - 46.6 % 41.0 42.7 45.7  Platelets 145 - 400 K/uL 90(L) 152 161   . CBC    Component Value Date/Time   WBC 5.8 01/03/2018 1245   RBC 4.60 01/03/2018 1245   RBC 4.60 01/03/2018 1245   HGB 13.6 01/03/2018 1245   HCT 41.0 01/03/2018 1245   PLT 90 (L) 01/03/2018 1245   PLT 161 12/18/2017 0746   MCV 89.1 01/03/2018 1245   MCH 29.6 01/03/2018 1245   MCHC 33.2 01/03/2018 1245   RDW 13.6 01/03/2018 1245   LYMPHSABS 1.1 01/03/2018 1245   MONOABS 0.0 (L) 01/03/2018 1245   EOSABS 0.1 01/03/2018 1245   BASOSABS 0.1 01/03/2018 1245   . CMP Latest Ref Rng & Units 01/03/2018 12/28/2017 12/18/2017  Glucose 70 - 140 mg/dL 107 114 76  BUN 7 - 26 mg/dL 14 14 14   Creatinine 0.60 - 1.10 mg/dL 0.84 1.08 1.04  Sodium 136 - 145 mmol/L 139 144 144  Potassium 3.5 - 5.1 mmol/L 4.2 3.8 3.8  Chloride 98 - 109 mmol/L 102 107 106  CO2 22 - 29 mmol/L 29 28 28   Calcium 8.4 - 10.4 mg/dL 9.7 10.0 9.9  Total Protein 6.4 - 8.3 g/dL 6.3(L) 6.6 6.6  Total Bilirubin 0.2 - 1.2 mg/dL 0.8 0.7 0.7  Alkaline Phos 40 - 150  U/L 78 71 72  AST 5 - 34 U/L 11 19 17   ALT 0 - 55 U/L 13 15 15    . Lab Results   Component Value Date   LDH 215 12/28/2017   Component     Latest Ref Rng & Units 11/01/2017  Retic Ct Pct     0.7 - 2.1 % 1.2  RBC.     3.70 - 5.45 MIL/uL 5.18  Retic Count, Absolute     33.7 - 90.7 K/uL 62.2  Hep B Core Ab, Tot     Negative Negative  Hepatitis B Surface Ag     Negative Negative  HCV Ab     0.0 - 0.9 s/co ratio <0.1  HIV Screen 4th Generation wRfx     Non Reactive Non Reactive  Sed Rate     0 - 22 mm/hr 1    PATHOLOGY         :        PROCEDURES  ECHO 11/22/17 Study Conclusions - Left ventricle: The cavity size was normal. Septal wall thickness   was increased in a pattern of moderate LVH with otherwise mild   concentric hypertrophy. Systolic function was normal. The   estimated ejection fraction was in the range of 60% to 65%. Wall   motion was normal; there were no regional wall motion   abnormalities. Doppler parameters are consistent with abnormal   left ventricular relaxation (grade 1 diastolic dysfunction).   Doppler parameters are consistent with indeterminate ventricular   filling pressure. - Aortic valve: Transvalvular velocity was within the normal range.   There was no stenosis. There was mild regurgitation. - Mitral valve: Transvalvular velocity was within the normal range.   There was no evidence for stenosis. There was no regurgitation. - Right ventricle: The cavity size was normal. Wall thickness was   normal. Systolic function was normal. - Tricuspid valve: There was mild regurgitation. - Pulmonary arteries: Systolic pressure was within the normal   range. PA peak pressure: 27 mm Hg (S). - Global longitudinal strain -17.7% (normal)    RADIOGRAPHIC STUDIES: I have personally reviewed the radiological images as listed and agreed with the findings in the report. Dg Chest Port 1 View  Result Date: 12/12/2017 CLINICAL DATA:  Port-A-Cath placement. EXAM: PORTABLE CHEST 1 VIEW COMPARISON:  None. FINDINGS: Power port placed  from a left subclavian approach. The tip is in the SVC 4 cm above the right atrium. No pneumothorax. The lungs remain clear. Heart size is normal. Chronic aortic atherosclerosis and tortuosity. No significant bone finding. IMPRESSION: Port-A-Cath well-positioned.  No complication. Electronically Signed   By: Nelson Chimes M.D.   On: 12/12/2017 12:43   Dg Fluoro Guide Cv Line-no Report  Result Date: 12/12/2017 Fluoroscopy was utilized by the requesting physician.  No radiographic interpretation.    ASSESSMENT & PLAN:  77 y.o. is a female with prior hx of HTN and high cholesterol    1. Reecently Diagnosed High grade Follicular lymphoma 2. Significant intra-abdominal generalized lymphadenopathy from high grade FL  11/08/17 and 11/27/17 LN Biopsy confirms Non-Hodgkin's B Cell Lymphoma -follicular lymphoma High Grade (grade 3) HIV non reactive No symptoms suggest an intra-abd /pelvic inflammatory process at this time.  Baseline LDH levels normal at 200 (11/01/17)  Port placed on 12/12/17 [facility] with nl EF  Component     Latest Ref Rng & Units 11/01/2017  Hep B Core Ab, Tot     Negative Negative  Hepatitis B Surface Ag  Negative Negative  HCV Ab     0.0 - 0.9 s/co ratio <0.1  HIV Screen 4th Generation wRfx     Non Reactive Non Reactive   We reviewed PET scan from 11/19/17 - with enlarged active lymph nodes primarily in her abdomen and small lymph nodes in her chest and neck, evidence of stage III disease.    PLAN --Discussed pt labwork today 01/03/18; blood counts are stable and normal except for Platelets at 90k, which we will continue to watch. Chemistries are normal and stable, except for Total Protein which is borderline low at 6.3.   -Recommend mouthwash mixture with salt, baking soda and water; 4-5 times each day fro grade 1 mucositis and sucking on popsicles with next chemotherapy -Pt could stop Allopurinol now, but continue to stay well hydrated to flush out uric acid. -Encouraged pt  to continue staying active and eating well.    3. HTN -She is on HTN medication PLAN  -Given her previous drop of BP I advised her to continue to closely monitor her BP at home and for her to contact her PCP  4. Interrupted Sleep Cycle  -She has tried melatonin and Tylenol PM -Recommend using Ativan for help sleeping and to help her anxiety.   5. Constipation -Recommend Magnesium Citrate 156ml x 1 -senna-s 2 pills twice a day and then just at night - Hold Miralax.    Please schedule C2 and C3 of treatment RTC with Dr Irene Limbo with C2D1 of R-CHOP with labs on 01/18/2018   All of the patients questions were answered with apparent satisfaction. The patient knows to call the clinic with any problems, questions or concerns.  . The total time spent in the appointment was 25 minutes and more than 50% was on counseling and direct patient cares.      Sullivan Lone MD MS AAHIVMS [facility] Hematology/Oncology Physician [facility]  (Office):       [phone] (Work cell):  [phone] (Fax):           [phone]  01/03/2018 1:59 PM  This document serves as a record of services personally performed by Sullivan Lone, MD. It was created on his behalf by Baldwin Jamaica, a trained medical scribe. The creation of this record is based on the scribe's personal observations and the provider's statements to them.   .I have reviewed the above documentation for accuracy and completeness, and I agree with the above. Brunetta Genera MD MS

## 2018-01-03 ENCOUNTER — Encounter: Payer: Self-pay | Admitting: Hematology

## 2018-01-03 ENCOUNTER — Telehealth: Payer: Self-pay | Admitting: *Deleted

## 2018-01-03 ENCOUNTER — Telehealth: Payer: Self-pay

## 2018-01-03 ENCOUNTER — Inpatient Hospital Stay: Payer: Medicare HMO | Admitting: Hematology

## 2018-01-03 ENCOUNTER — Inpatient Hospital Stay: Payer: Medicare HMO

## 2018-01-03 VITALS — BP 140/80 | HR 76 | Temp 98.5°F | Resp 18 | Ht 66.0 in | Wt 132.4 lb

## 2018-01-03 DIAGNOSIS — G479 Sleep disorder, unspecified: Secondary | ICD-10-CM | POA: Diagnosis not present

## 2018-01-03 DIAGNOSIS — Z5112 Encounter for antineoplastic immunotherapy: Secondary | ICD-10-CM | POA: Diagnosis not present

## 2018-01-03 DIAGNOSIS — F19982 Other psychoactive substance use, unspecified with psychoactive substance-induced sleep disorder: Secondary | ICD-10-CM

## 2018-01-03 DIAGNOSIS — K5900 Constipation, unspecified: Secondary | ICD-10-CM

## 2018-01-03 DIAGNOSIS — C8285 Other types of follicular lymphoma, lymph nodes of inguinal region and lower limb: Secondary | ICD-10-CM

## 2018-01-03 DIAGNOSIS — I10 Essential (primary) hypertension: Secondary | ICD-10-CM | POA: Diagnosis not present

## 2018-01-03 DIAGNOSIS — K1231 Oral mucositis (ulcerative) due to antineoplastic therapy: Secondary | ICD-10-CM

## 2018-01-03 DIAGNOSIS — R69 Illness, unspecified: Secondary | ICD-10-CM | POA: Diagnosis not present

## 2018-01-03 DIAGNOSIS — C8248 Follicular lymphoma grade IIIb, lymph nodes of multiple sites: Secondary | ICD-10-CM

## 2018-01-03 DIAGNOSIS — F419 Anxiety disorder, unspecified: Secondary | ICD-10-CM

## 2018-01-03 DIAGNOSIS — C8238 Follicular lymphoma grade IIIa, lymph nodes of multiple sites: Secondary | ICD-10-CM

## 2018-01-03 DIAGNOSIS — K5903 Drug induced constipation: Secondary | ICD-10-CM

## 2018-01-03 LAB — CBC WITH DIFFERENTIAL/PLATELET
BASOS ABS: 0.1 10*3/uL (ref 0.0–0.1)
BASOS PCT: 1 %
EOS ABS: 0.1 10*3/uL (ref 0.0–0.5)
EOS PCT: 2 %
HCT: 41 % (ref 34.8–46.6)
HEMOGLOBIN: 13.6 g/dL (ref 11.6–15.9)
LYMPHS ABS: 1.1 10*3/uL (ref 0.9–3.3)
Lymphocytes Relative: 19 %
MCH: 29.6 pg (ref 25.1–34.0)
MCHC: 33.2 g/dL (ref 31.5–36.0)
MCV: 89.1 fL (ref 79.5–101.0)
Monocytes Absolute: 0 10*3/uL — ABNORMAL LOW (ref 0.1–0.9)
Monocytes Relative: 1 %
NEUTROS PCT: 77 %
Neutro Abs: 4.5 10*3/uL (ref 1.5–6.5)
PLATELETS: 90 10*3/uL — AB (ref 145–400)
RBC: 4.6 MIL/uL (ref 3.70–5.45)
RDW: 13.6 % (ref 11.2–14.5)
WBC: 5.8 10*3/uL (ref 3.9–10.3)

## 2018-01-03 LAB — CMP (CANCER CENTER ONLY)
ALT: 13 U/L (ref 0–55)
AST: 11 U/L (ref 5–34)
Albumin: 3.6 g/dL (ref 3.5–5.0)
Alkaline Phosphatase: 78 U/L (ref 40–150)
Anion gap: 8 (ref 3–11)
BILIRUBIN TOTAL: 0.8 mg/dL (ref 0.2–1.2)
BUN: 14 mg/dL (ref 7–26)
CALCIUM: 9.7 mg/dL (ref 8.4–10.4)
CHLORIDE: 102 mmol/L (ref 98–109)
CO2: 29 mmol/L (ref 22–29)
CREATININE: 0.84 mg/dL (ref 0.60–1.10)
Glucose, Bld: 107 mg/dL (ref 70–140)
Potassium: 4.2 mmol/L (ref 3.5–5.1)
Sodium: 139 mmol/L (ref 136–145)
TOTAL PROTEIN: 6.3 g/dL — AB (ref 6.4–8.3)

## 2018-01-03 LAB — RETICULOCYTES: RBC.: 4.6 MIL/uL (ref 3.70–5.45)

## 2018-01-03 MED ORDER — MAGNESIUM CITRATE PO SOLN: 1.0000 | Freq: Once | ORAL | 0 refills | Status: AC

## 2018-01-03 MED ORDER — SENNOSIDES-DOCUSATE SODIUM 8.6-50 MG PO TABS: 2.0000 | ORAL_TABLET | Freq: Two times a day (BID) | ORAL | 2 refills | Status: DC

## 2018-01-03 NOTE — Telephone Encounter (Signed)
Printed avs and calender for upcoming appointments. Per 3/21 los to follow the care plan. Injection were place on Monday at patient request/ possibility of getting shots at [facility]. Patient is aware that a provider is needed for the week of 5/26

## 2018-01-03 NOTE — Patient Instructions (Signed)
Thank you for choosing [facility] to provide your oncology and hematology care.  To afford each patient quality time with our providers, please arrive 30 minutes before your scheduled appointment time.  If you arrive late for your appointment, you may be asked to reschedule.  We strive to give you quality time with our providers, and arriving late affects you and other patients whose appointments are after yours.   If you are a no show for multiple scheduled visits, you may be dismissed from the clinic at the providers discretion.    Again, thank you for choosing [facility], our hope is that these requests will decrease the amount of time that you wait before being seen by our physicians.  ______________________________________________________________________  Should you have questions after your visit to the [facility], please contact our office at [phone] between the hours of 8:30 and 4:30 p.m.    Voicemails left after 4:30p.m will not be returned until the following business day.    For prescription refill requests, please have your pharmacy contact us directly.  Please also try to allow 48 hours for prescription requests.    Please contact the scheduling department for questions regarding scheduling.  For scheduling of procedures such as PET scans, CT scans, MRI, Ultrasound, etc please contact central scheduling at [phone].    Resources For Cancer Patients and Caregivers:   Oncolink.org:  A wonderful resource for patients and healthcare providers for information regarding your disease, ways to tract your treatment, what to expect, etc.     American Cancer Society:  800-227-2345  Can help patients locate various types of support and financial assistance  Cancer Care: 1-800-813-HOPE (4673) Provides financial assistance, online support groups, medication/co-pay assistance.    [place] DSS:  336-641-3447 Where to apply for food  stamps, Medicaid, and utility assistance  Medicare Rights Center: 800-333-4114 Helps people with Medicare understand their rights and benefits, navigate the Medicare system, and secure the quality healthcare they deserve  SCAT: 336-333-6589 [place] [employer]'s shared-ride transportation service for eligible riders who have a disability that prevents them from riding the fixed route bus.    For additional information on assistance programs please contact our social worker:   Grier Hock/Abigail Elmore:  [phone]            

## 2018-01-03 NOTE — Telephone Encounter (Signed)
Voicemail received "I need Der. Kale's nurse to call me at [phone] in about 45 minutes."  No answer with attempt to reach patient for further assistance.  Message left confirming receipt of message left earlier.

## 2018-01-04 ENCOUNTER — Telehealth: Payer: Self-pay | Admitting: *Deleted

## 2018-01-04 NOTE — Telephone Encounter (Signed)
Per Lupita Raider, RN at [facility], neulasta injections scheduled for the Mondays after chemo appointments.  Appointments scheduled for the mornings due to 72 hr window, and unsure exactly what times chemo may finish on Fridays.  LVM with patient with this information.  Patient to get new schedule printed at next appointment.  Call back number provided.

## 2018-01-08 ENCOUNTER — Telehealth: Payer: Self-pay | Admitting: *Deleted

## 2018-01-08 NOTE — Telephone Encounter (Signed)
Pt called reporting having diarrhea.  Spoke with pt and was informed that pt has been taking stool softener daily as instructed by Dr. Irene Limbo.   Stated having diarrhea ( mushy stools )  X 4 today.   Instructed pt not to take stool softener for now; increased fluid intake as tolerated.  Recommended  BRAT diet.   Instructed pt to call office back in the am if diarrhea not resolved and/or to give nurse updates.  Pt voiced understanding.

## 2018-01-10 NOTE — Telephone Encounter (Signed)
Good that she got cleaned out after taking Magnesium and increased laxative--- she had been constipated for a week. thx GK

## 2018-01-11 ENCOUNTER — Telehealth: Payer: Self-pay

## 2018-01-11 NOTE — Telephone Encounter (Signed)
Pt concerned that she had two appointments with Dr. Irene Limbo next week. Looked at appointments, and pt only scheduled for lab, flush, doctor, and infusion on 01/18/18 (Friday). Appt with Kale on 01/16/18 was cancelled as of 01/04/18. Pt verbalized that she had no received a phone call letting her know of cancellation. Apologized to pt for the lack of communication and assured her that she could disregard the Wednesday appt next week. Pt verbalized understanding and thanks for the communication.

## 2018-01-11 NOTE — Telephone Encounter (Signed)
Pt called today and left VM requesting call back. She stated that it was not an urgent matter. This RN called back but was unable to reach the pt and left a VM to call back [phone] and that this RN would be at the desk next week as well.

## 2018-01-16 ENCOUNTER — Ambulatory Visit: Payer: Medicare HMO | Admitting: Hematology

## 2018-01-16 NOTE — Progress Notes (Unsigned)
Clarified with RN Loren patient prefers Nurse, adult.  OnPro will be discontinued from Treatment Plan.  V.O. Dr Marjory Sneddon RN/CJones  Henreitta Leber, PharmD

## 2018-01-17 NOTE — Progress Notes (Signed)
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 female who has been referred to [place] by Dr. Allyn Kenner for evaluation and management of enlarged lymph node to left inguinal area.   She is accompanied by her daughter today. She notes that she is doing well overall. She notes that she initially presented with a palpable lump to her left inguinal area for approximately 3-4 weeks. She denies pain to the area, but notes that the area feels larger today. She reports that she typically goes to the doctor twice a year for a medication refill.   She had an [place] on 10/19/17 significant for: IMPRESSION: Multiple prominent masses in the left groin with the largest measuring up to 4.4 cm. These are most likely lymph nodes.   Subsequently, she had a CT AP with contrast completed on 10/26/17 with results of: IMPRESSION: CT evidence of lymphoma, with bulky lymph nodes in multiple nodal stations of the abdomen and pelvis, predominantly left inguinal, left iliac, and the para-aortic nodes. 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   She has a PMHx of HTN and high cholesterol which she takes medications for. She denies a PMHx of DM, cardiac issues, abdominal issues, lung issues, surgeries, or major injuries. She denies smoking cigarettes, drinking ETOH, or chemical exposure.  Her father had a hx of kidney cancer and her mother with lung cancer. Her mother did smoke cigarettes. She denies any other family hx of blood disorders or blood cancers. She has two siblings whom have had either a kidney removal or on dialysis respectively. 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    On review of systems, she reports a slower urinary stream, urinary urgency x several years, resolved diarrhea x yesterday, chronic left hip pain x several years, and chronic right shoulder pain (previously diagnosed with bursitis). She denies her urgency being urgent to need an evaluation. She attributes the diarrhea to something that she ate yesterday. She notes a prior hx of infection to her great toe which she self-treated with Listerine soaks and OTC antifungal creams. She denies abdominal pain, vaginal discharge, fever, chills, night sweats, unexpected weight loss, leg swelling, bowel issues, urinary frequency, vaginal bleeding, foot/toe infection, abdominal trauma, bowel habit changes, prior hx of diverticulitis, lumps anywhere else on the body, and any other symptoms.    INTERVAL HISTORY   Alicia [name] is here for a follow up and continued management of her high grade follicular lymphadenopathy andevaluation prior to her 2nd cycle of R-CHOP.  The patient's last visit with [place] was on 01/03/18. She is accompanied today by her son. She has been staying active with riding her bicycle and notes that this past week she has "felt as good as I've ever felt".    The pt reports resolved constipation and mouth sores. She notes that she is feeling very well and is tolerating the treatment well. She also notes that the nodule in her groin has decreased slightly.   She notes that her melatonin and tylenol PM have been satisfactory for helping her sleep at night. She does not need any medication refills and will continue  to use the Ativan prn.   Lab results today (01/18/18) of CBC, CMP is as follows: all values are WNL except for WBC at 11.1k, Neutro Abs at 9.5k, Total Protein at 6.1.  Uric Acid, serum is WNL at 4.9.   On review of systems, pt reports good energy levels, shrinking inguinal nodule, and denies back pains, constipation, mouth sores, abdominal pains, and any  other symptoms.   MEDICAL HISTORY:  Past Medical History:  Diagnosis Date  . Arthritis    back and hips  . GERD (gastroesophageal reflux disease)    TUMS  . Hypercholesterolemia   . Hypertension   . Non Hodgkin's lymphoma ([place])     SURGICAL HISTORY: Past Surgical History:  Procedure Laterality Date  . LYMPH NODE BIOPSY Left 11/27/2017   Procedure: LEFT INGUINAL LYMPH NODE BIOPSY;  Surgeon: Stark Klein, MD;  Location: [place];  Service: General;  Laterality: Left;  . PORTACATH PLACEMENT Left 12/12/2017   Procedure: INSERTION PORT-A-CATH;  Surgeon: Stark Klein, MD;  Location: [place];  Service: General;  Laterality: Left;  . TONSILLECTOMY      SOCIAL HISTORY: Social History   Socioeconomic History  . Marital status: Widowed    Spouse name: Not on file  . Number of children: Not on file  . Years of education: Not on file  . Highest education level: Not on file  Occupational History  . Not on file  Social Needs  . Financial resource strain: Not on file  . Food insecurity:    Worry: Not on file    Inability: Not on file  . Transportation needs:    Medical: Not on file    Non-medical: Not on file  Tobacco Use  . Smoking status: Never Smoker  . Smokeless tobacco: Never Used  Substance and Sexual Activity  . Alcohol use: No  . Drug use: No  . Sexual activity: Not on file  Lifestyle  . Physical activity:    Days per week: Not on file    Minutes per session: Not on file  . Stress: Not on file  Relationships  . Social connections:    Talks on phone: Not on file    Gets together: Not on file    Attends religious service: Not on file    Active member of club or organization: Not on file    Attends meetings of clubs or organizations: Not on file    Relationship status: Not on file  . Intimate partner violence:    Fear of current or ex partner: Not on file    Emotionally abused: Not on file    Physically abused: Not on file    Forced sexual  activity: Not on file  Other Topics Concern  . Not on file  Social History Narrative  . Not on file    FAMILY HISTORY: History reviewed. No pertinent family history.  ALLERGIES:  has No Known Allergies.  MEDICATIONS:  No current facility-administered medications for this visit.    No current outpatient medications on file.   Facility-Administered Medications Ordered in Other Visits  Medication Dose Route Frequency Provider Last Rate Last Dose  . 0.9 %  sodium chloride infusion   Intravenous Continuous Kathie Dike, MD 75 mL/hr at 01/25/18 0250    . acetaminophen (TYLENOL) tablet 650 mg  650 mg Oral Q6H PRN Kathie Dike, MD   650 mg at 01/24/18 1850   Or  . acetaminophen (TYLENOL) suppository 650 mg  650 mg Rectal Q6H  PRN Kathie Dike, MD      . bisoprolol (ZEBETA) tablet 5 mg  5 mg Oral BID Kathie Dike, MD   5 mg at 01/25/18 1038  . diphenhydrAMINE (BENADRYL) capsule 25 mg  25 mg Oral QHS PRN Kathie Dike, MD   25 mg at 01/24/18 2117  . simvastatin (ZOCOR) tablet 10 mg  10 mg Oral QHS Kathie Dike, MD   10 mg at 01/24/18 2120   And  . ezetimibe (ZETIA) tablet 10 mg  10 mg Oral QHS Kathie Dike, MD   10 mg at 01/24/18 2117  . magic mouthwash  5 mL Oral QID PRN Kathie Dike, MD       And  . lidocaine (XYLOCAINE) 2 % viscous mouth solution 5 mL  5 mL Mouth/Throat QID PRN Kathie Dike, MD      . LORazepam (ATIVAN) tablet 0.5 mg  0.5 mg Oral Q6H PRN Kathie Dike, MD   0.5 mg at 01/24/18 2244  . ondansetron (ZOFRAN) tablet 4 mg  4 mg Oral Q6H PRN Kathie Dike, MD       Or  . ondansetron (ZOFRAN) injection 4 mg  4 mg Intravenous Q6H PRN Kathie Dike, MD      . phosphorus (K PHOS NEUTRAL) tablet 500 mg  500 mg Oral TID Kathie Dike, MD   500 mg at 01/25/18 1038    REVIEW OF SYSTEMS:    10 Point review of Systems was done is negative except as noted above.   PHYSICAL EXAMINATION:  ECOG PERFORMANCE STATUS: 0 - Asymptomatic  . Vitals:    01/18/18 0950  BP: 133/69  Pulse: 69  Resp: 18  Temp: 98.6 F (37 C)  SpO2: 97%   Filed Weights   01/18/18 0950  Weight: 133 lb 9.6 oz (60.6 kg)   .Body mass index is 21.56 kg/m.  GENERAL:alert, in no acute distress and comfortable SKIN: no acute rashes, no significant lesions EYES: conjunctiva are pink and non-injected, sclera anicteric OROPHARYNX: MMM, no exudates, no oropharyngeal erythema or ulceration NECK: supple, no JVD LYMPH:  no palpable lymphadenopathy in the cervical, axillary or inguinal regions LUNGS: clear to auscultation b/l with normal respiratory effort HEART: regular rate & rhythm ABDOMEN:  normoactive bowel sounds , non tender, not distended. Extremity: no pedal edema PSYCH: alert & oriented x 3 with fluent speech NEURO: no focal motor/sensory deficits     LABORATORY DATA:  I have reviewed the data as listed  . CBC Latest Ref Rng & Units 01/18/2018 01/03/2018 12/28/2017  WBC 3.9 - 10.3 K/uL 11.1(H) 5.8 5.0  Hemoglobin 11.6 - 15.9 g/dL - 13.6 13.9  Hematocrit 34.8 - 46.6 % 37.7 41.0 42.7  Platelets 145 - 400 K/uL 292 90(L) 152  HGB 12.2 ANC 9.5k . CBC    Component Value Date/Time   WBC 11.1 (H) 01/18/2018 0910   WBC 5.8 01/03/2018 1245   RBC 4.25 01/18/2018 0910   HGB 13.6 01/03/2018 1245   HCT 37.7 01/18/2018 0910   PLT 292 01/18/2018 0910   MCV 88.7 01/18/2018 0910   MCH 29.2 01/18/2018 0910   MCHC 33.0 01/18/2018 0910   RDW 14.3 01/18/2018 0910   LYMPHSABS 1.0 01/18/2018 0910   MONOABS 0.5 01/18/2018 0910   EOSABS 0.0 01/18/2018 0910   BASOSABS 0.1 01/18/2018 0910   . CMP Latest Ref Rng & Units 01/18/2018 01/03/2018 12/28/2017  Glucose 70 - 140 mg/dL 136 107 114  BUN 7 - 26 mg/dL 13 14 14   Creatinine 0.60 -  1.10 mg/dL 0.96 0.84 1.08  Sodium 136 - 145 mmol/L 143 139 144  Potassium 3.5 - 5.1 mmol/L 3.7 4.2 3.8  Chloride 98 - 109 mmol/L 108 102 107  CO2 22 - 29 mmol/L 28 29 28   Calcium 8.4 - 10.4 mg/dL 9.4 9.7 10.0  Total Protein 6.4 -  8.3 g/dL 6.1(L) 6.3(L) 6.6  Total Bilirubin 0.2 - 1.2 mg/dL 0.6 0.8 0.7  Alkaline Phos 40 - 150 U/L 71 78 71  AST 5 - 34 U/L 18 11 19   ALT 0 - 55 U/L 16 13 15    . Lab Results  Component Value Date   LDH 215 12/28/2017   Component     Latest Ref Rng & Units 11/01/2017  Retic Ct Pct     0.7 - 2.1 % 1.2  RBC.     3.70 - 5.45 MIL/uL 5.18  Retic Count, Absolute     33.7 - 90.7 K/uL 62.2  Hep B Core Ab, Tot     Negative Negative  Hepatitis B Surface Ag     Negative Negative  HCV Ab     0.0 - 0.9 s/co ratio <0.1  HIV Screen 4th Generation wRfx     Non Reactive Non Reactive  Sed Rate     0 - 22 mm/hr 1    PATHOLOGY         :        PROCEDURES  ECHO 11/22/17 Study Conclusions - Left ventricle: The cavity size was normal. Septal wall thickness   was increased in a pattern of moderate LVH with otherwise mild   concentric hypertrophy. Systolic function was normal. The   estimated ejection fraction was in the range of 60% to 65%. Wall   motion was normal; there were no regional wall motion   abnormalities. Doppler parameters are consistent with abnormal   left ventricular relaxation (grade 1 diastolic dysfunction).   Doppler parameters are consistent with indeterminate ventricular   filling pressure. - Aortic valve: Transvalvular velocity was within the normal range.   There was no stenosis. There was mild regurgitation. - Mitral valve: Transvalvular velocity was within the normal range.   There was no evidence for stenosis. There was no regurgitation. - Right ventricle: The cavity size was normal. Wall thickness was   normal. Systolic function was normal. - Tricuspid valve: There was mild regurgitation. - Pulmonary arteries: Systolic pressure was within the normal   range. PA peak pressure: 27 mm Hg (S). - Global longitudinal strain -17.7% (normal)    RADIOGRAPHIC STUDIES: I have personally reviewed the radiological images as listed and agreed with the  findings in the report. No results found.  ASSESSMENT & PLAN:  77 y.o. is a female with prior hx of HTN and high cholesterol    1. Recently Diagnosed High grade Follicular lymphoma 2. Significant intra-abdominal generalized lymphadenopathy from high grade FL  11/08/17 and 11/27/17 LN Biopsy confirms Non-Hodgkin's B Cell Lymphoma -follicular lymphoma High Grade (grade 3) HIV non reactive No symptoms suggest an intra-abd /pelvic inflammatory process at this time.  Baseline LDH levels normal at 200 (11/01/17)  Port placed on 12/12/17 [facility] with nl EF  Component     Latest Ref Rng & Units 11/01/2017  Hep B Core Ab, Tot     Negative Negative  Hepatitis B Surface Ag     Negative Negative  HCV Ab     0.0 - 0.9 s/co ratio <0.1  HIV Screen 4th Generation wRfx  Non Reactive Non Reactive   We reviewed PET scan from 11/19/17 - with enlarged active lymph nodes primarily in her abdomen and small lymph nodes in her chest and neck, evidence of stage IIIA disease.    PLAN Labs today are stable. -Encouraged pt to continue staying active and eating well. -Pt has no prohibitive toxicities from R-CHOP and is set to continue C2. -Recommend mouthwash mixture with salt, baking soda and water; 4-5 times each day fro grade 1 mucositis and sucking on popsicles with next chemotherapy -Will prescribe Peridex for preventative gum infection givne poor dental status at baseline.. -will hold allopurinol given no evidence of TLS  3. HTN -She is on HTN medication PLAN  -Given her previous drop of BP I advised her to continue to closely monitor her BP at home and for her to contact her PCP  4. Interrupted Sleep Cycle  -She has tried melatonin and Tylenol PM -Recommend using Ativan for help sleeping and to help her anxiety.   5. Constipation- resolved. likely from Vincristine. -discussed will need to be proactive with use of laxatives. -Recommend 2 tablets twice each day of Senna S with next cycle  To avoid  significant constipation. Can reduce to 2tab po HIS prn if diarrhea ensues  RTC with Dr Irene Limbo in 3 weeks with labs Please schedule C3 and C4 of R-CHOP with neulasta support.   All of the patients questions were answered with apparent satisfaction. The patient knows to call the clinic with any problems, questions or concerns.  . The total time spent in the appointment was 25 minutes and more than 50% was on counseling and direct patient cares.      Sullivan Lone MD [place] AAHIVMS [facility] Hematology/Oncology Physician [facility]  (Office):       [phone] (Work cell):  [phone] (Fax):           [phone]  01/18/2018 10:40 AM  This document serves as a record of services personally performed by Sullivan Lone, MD. It was created on his behalf by Baldwin Jamaica, a trained medical scribe. The creation of this record is based on the scribe's personal observations and the provider's statements to them.   .I have reviewed the above documentation for accuracy and completeness, and I agree with the above. Brunetta Genera MD MS

## 2018-01-18 ENCOUNTER — Encounter: Payer: Self-pay | Admitting: Hematology

## 2018-01-18 ENCOUNTER — Inpatient Hospital Stay: Payer: Medicare HMO | Attending: Hematology | Admitting: Hematology

## 2018-01-18 ENCOUNTER — Inpatient Hospital Stay: Payer: Medicare HMO

## 2018-01-18 VITALS — BP 133/69 | HR 69 | Temp 98.6°F | Resp 18 | Ht 66.0 in | Wt 133.6 lb

## 2018-01-18 VITALS — BP 99/60 | Resp 16

## 2018-01-18 DIAGNOSIS — E876 Hypokalemia: Secondary | ICD-10-CM | POA: Insufficient documentation

## 2018-01-18 DIAGNOSIS — R0609 Other forms of dyspnea: Secondary | ICD-10-CM | POA: Diagnosis not present

## 2018-01-18 DIAGNOSIS — R3915 Urgency of urination: Secondary | ICD-10-CM | POA: Diagnosis not present

## 2018-01-18 DIAGNOSIS — I959 Hypotension, unspecified: Secondary | ICD-10-CM | POA: Insufficient documentation

## 2018-01-18 DIAGNOSIS — Z5112 Encounter for antineoplastic immunotherapy: Secondary | ICD-10-CM | POA: Diagnosis not present

## 2018-01-18 DIAGNOSIS — Z7189 Other specified counseling: Secondary | ICD-10-CM

## 2018-01-18 DIAGNOSIS — F419 Anxiety disorder, unspecified: Secondary | ICD-10-CM | POA: Diagnosis not present

## 2018-01-18 DIAGNOSIS — I10 Essential (primary) hypertension: Secondary | ICD-10-CM | POA: Diagnosis not present

## 2018-01-18 DIAGNOSIS — C8285 Other types of follicular lymphoma, lymph nodes of inguinal region and lower limb: Secondary | ICD-10-CM

## 2018-01-18 DIAGNOSIS — C8248 Follicular lymphoma grade IIIb, lymph nodes of multiple sites: Secondary | ICD-10-CM

## 2018-01-18 DIAGNOSIS — R69 Illness, unspecified: Secondary | ICD-10-CM | POA: Diagnosis not present

## 2018-01-18 DIAGNOSIS — Z95828 Presence of other vascular implants and grafts: Secondary | ICD-10-CM

## 2018-01-18 DIAGNOSIS — Z5111 Encounter for antineoplastic chemotherapy: Secondary | ICD-10-CM | POA: Diagnosis present

## 2018-01-18 DIAGNOSIS — C8238 Follicular lymphoma grade IIIa, lymph nodes of multiple sites: Secondary | ICD-10-CM

## 2018-01-18 LAB — CBC WITH DIFFERENTIAL (CANCER CENTER ONLY)
BASOS PCT: 1 %
Basophils Absolute: 0.1 10*3/uL (ref 0.0–0.1)
EOS ABS: 0 10*3/uL (ref 0.0–0.5)
Eosinophils Relative: 0 %
HEMATOCRIT: 37.7 % (ref 34.8–46.6)
HEMOGLOBIN: 12.4 g/dL (ref 11.6–15.9)
Lymphocytes Relative: 9 %
Lymphs Abs: 1 10*3/uL (ref 0.9–3.3)
MCH: 29.2 pg (ref 25.1–34.0)
MCHC: 33 g/dL (ref 31.5–36.0)
MCV: 88.7 fL (ref 79.5–101.0)
MONO ABS: 0.5 10*3/uL (ref 0.1–0.9)
MONOS PCT: 4 %
NEUTROS ABS: 9.5 10*3/uL — AB (ref 1.5–6.5)
NEUTROS PCT: 86 %
Platelet Count: 292 10*3/uL (ref 145–400)
RBC: 4.25 MIL/uL (ref 3.70–5.45)
RDW: 14.3 % (ref 11.2–14.5)
WBC Count: 11.1 10*3/uL — ABNORMAL HIGH (ref 3.9–10.3)

## 2018-01-18 LAB — CMP (CANCER CENTER ONLY)
ALBUMIN: 3.5 g/dL (ref 3.5–5.0)
ALK PHOS: 71 U/L (ref 40–150)
ALT: 16 U/L (ref 0–55)
AST: 18 U/L (ref 5–34)
Anion gap: 7 (ref 3–11)
BUN: 13 mg/dL (ref 7–26)
CALCIUM: 9.4 mg/dL (ref 8.4–10.4)
CO2: 28 mmol/L (ref 22–29)
CREATININE: 0.96 mg/dL (ref 0.60–1.10)
Chloride: 108 mmol/L (ref 98–109)
GFR, Estimated: 56 mL/min — ABNORMAL LOW (ref >60)
GLUCOSE: 136 mg/dL (ref 70–140)
Potassium: 3.7 mmol/L (ref 3.5–5.1)
SODIUM: 143 mmol/L (ref 136–145)
Total Bilirubin: 0.6 mg/dL (ref 0.2–1.2)
Total Protein: 6.1 g/dL — ABNORMAL LOW (ref 6.4–8.3)

## 2018-01-18 LAB — URIC ACID: Uric Acid, Serum: 4.9 mg/dL (ref 2.6–7.4)

## 2018-01-18 MED ORDER — SODIUM CHLORIDE 0.9 % IV SOLN
Freq: Once | INTRAVENOUS | Status: AC
  Administered 2018-01-18: 12:00:00 via INTRAVENOUS

## 2018-01-18 MED ORDER — RITUXIMAB CHEMO INJECTION 500 MG/50ML
375.0000 mg/m2 | Freq: Once | INTRAVENOUS | Status: AC
  Administered 2018-01-18: 600 mg via INTRAVENOUS
  Filled 2018-01-18: qty 50

## 2018-01-18 MED ORDER — SODIUM CHLORIDE 0.9% FLUSH
10.0000 mL | Freq: Once | INTRAVENOUS | Status: AC
  Administered 2018-01-18: 10 mL via INTRAVENOUS
  Filled 2018-01-18: qty 10

## 2018-01-18 MED ORDER — ACETAMINOPHEN 325 MG PO TABS
650.0000 mg | ORAL_TABLET | Freq: Once | ORAL | Status: AC
  Administered 2018-01-18: 650 mg via ORAL

## 2018-01-18 MED ORDER — SODIUM CHLORIDE 0.9% FLUSH
10.0000 mL | INTRAVENOUS | Status: DC | PRN
  Administered 2018-01-18: 10 mL
  Filled 2018-01-18: qty 10

## 2018-01-18 MED ORDER — PALONOSETRON HCL INJECTION 0.25 MG/5ML
INTRAVENOUS | Status: AC
  Filled 2018-01-18: qty 5

## 2018-01-18 MED ORDER — PALONOSETRON HCL INJECTION 0.25 MG/5ML
0.2500 mg | Freq: Once | INTRAVENOUS | Status: AC
  Administered 2018-01-18: 0.25 mg via INTRAVENOUS

## 2018-01-18 MED ORDER — VINCRISTINE SULFATE CHEMO INJECTION 1 MG/ML
2.0000 mg | Freq: Once | INTRAVENOUS | Status: AC
  Administered 2018-01-18: 2 mg via INTRAVENOUS
  Filled 2018-01-18: qty 2

## 2018-01-18 MED ORDER — DIPHENHYDRAMINE HCL 25 MG PO CAPS
ORAL_CAPSULE | ORAL | Status: AC
  Filled 2018-01-18: qty 2

## 2018-01-18 MED ORDER — DIPHENHYDRAMINE HCL 25 MG PO CAPS
50.0000 mg | ORAL_CAPSULE | Freq: Once | ORAL | Status: AC
  Administered 2018-01-18: 50 mg via ORAL

## 2018-01-18 MED ORDER — SODIUM CHLORIDE 0.9 % IV SOLN
750.0000 mg/m2 | Freq: Once | INTRAVENOUS | Status: AC
  Administered 2018-01-18: 1260 mg via INTRAVENOUS
  Filled 2018-01-18: qty 63

## 2018-01-18 MED ORDER — DEXAMETHASONE SODIUM PHOSPHATE 10 MG/ML IJ SOLN
INTRAMUSCULAR | Status: AC
  Filled 2018-01-18: qty 1

## 2018-01-18 MED ORDER — HEPARIN SOD (PORK) LOCK FLUSH 100 UNIT/ML IV SOLN
500.0000 [IU] | Freq: Once | INTRAVENOUS | Status: AC | PRN
  Administered 2018-01-18: 500 [IU]
  Filled 2018-01-18: qty 5

## 2018-01-18 MED ORDER — DEXAMETHASONE SODIUM PHOSPHATE 10 MG/ML IJ SOLN
10.0000 mg | Freq: Once | INTRAMUSCULAR | Status: AC
  Administered 2018-01-18: 10 mg via INTRAVENOUS

## 2018-01-18 MED ORDER — DOXORUBICIN HCL CHEMO IV INJECTION 2 MG/ML
50.0000 mg/m2 | Freq: Once | INTRAVENOUS | Status: AC
  Administered 2018-01-18: 84 mg via INTRAVENOUS
  Filled 2018-01-18: qty 42

## 2018-01-18 MED ORDER — ACETAMINOPHEN 325 MG PO TABS
ORAL_TABLET | ORAL | Status: AC
  Filled 2018-01-18: qty 2

## 2018-01-18 NOTE — Patient Instructions (Signed)
[facility] Discharge Instructions for Patients Receiving Chemotherapy  Today you received the following chemotherapy agents Adriamycin, Vincristine, Cytoxan, Rituxan.  To help prevent nausea and vomiting after your treatment, we encourage you to take your nausea medication as directed.  If you develop nausea and vomiting that is not controlled by your nausea medication, call the clinic.   BELOW ARE SYMPTOMS THAT SHOULD BE REPORTED IMMEDIATELY:  *FEVER GREATER THAN 100.5 F  *CHILLS WITH OR WITHOUT FEVER  NAUSEA AND VOMITING THAT IS NOT CONTROLLED WITH YOUR NAUSEA MEDICATION  *UNUSUAL SHORTNESS OF BREATH  *UNUSUAL BRUISING OR BLEEDING  TENDERNESS IN MOUTH AND THROAT WITH OR WITHOUT PRESENCE OF ULCERS  *URINARY PROBLEMS  *BOWEL PROBLEMS  UNUSUAL RASH Items with * indicate a potential emergency and should be followed up as soon as possible.  Feel free to call the clinic should you have any questions or concerns. The clinic phone number is [phone].  Please show the CHEMO ALERT CARD at check-in to the Emergency Department and triage nurse.   

## 2018-01-19 ENCOUNTER — Ambulatory Visit: Payer: Medicare HMO

## 2018-01-21 ENCOUNTER — Other Ambulatory Visit: Payer: Self-pay

## 2018-01-21 ENCOUNTER — Other Ambulatory Visit: Payer: Self-pay | Admitting: *Deleted

## 2018-01-21 ENCOUNTER — Encounter (HOSPITAL_COMMUNITY): Payer: Self-pay

## 2018-01-21 ENCOUNTER — Telehealth: Payer: Self-pay

## 2018-01-21 ENCOUNTER — Inpatient Hospital Stay (HOSPITAL_COMMUNITY): Payer: Medicare HMO | Attending: Internal Medicine

## 2018-01-21 ENCOUNTER — Ambulatory Visit: Payer: Medicare HMO

## 2018-01-21 ENCOUNTER — Telehealth: Payer: Self-pay | Admitting: *Deleted

## 2018-01-21 VITALS — BP 101/58 | HR 64 | Temp 98.0°F | Resp 18

## 2018-01-21 DIAGNOSIS — C8285 Other types of follicular lymphoma, lymph nodes of inguinal region and lower limb: Secondary | ICD-10-CM | POA: Diagnosis not present

## 2018-01-21 DIAGNOSIS — Z5189 Encounter for other specified aftercare: Secondary | ICD-10-CM | POA: Insufficient documentation

## 2018-01-21 DIAGNOSIS — C8238 Follicular lymphoma grade IIIa, lymph nodes of multiple sites: Secondary | ICD-10-CM

## 2018-01-21 DIAGNOSIS — Z7189 Other specified counseling: Secondary | ICD-10-CM

## 2018-01-21 MED ORDER — PEGFILGRASTIM INJECTION 6 MG/0.6ML ~~LOC~~
PREFILLED_SYRINGE | SUBCUTANEOUS | Status: AC
  Filled 2018-01-21: qty 0.6

## 2018-01-21 MED ORDER — PEGFILGRASTIM INJECTION 6 MG/0.6ML ~~LOC~~
6.0000 mg | PREFILLED_SYRINGE | Freq: Once | SUBCUTANEOUS | Status: AC
  Administered 2018-01-21: 6 mg via SUBCUTANEOUS

## 2018-01-21 MED ORDER — MAGIC MOUTHWASH W/LIDOCAINE: 5.0000 mL | Freq: Four times a day (QID) | ORAL | 0 refills | Status: DC | PRN

## 2018-01-21 NOTE — Telephone Encounter (Signed)
Called in script for magic mouthwash to [facility] in [place].

## 2018-01-21 NOTE — Telephone Encounter (Signed)
Per 4/5 los patient already scheduled

## 2018-01-21 NOTE — Telephone Encounter (Signed)
Received call from [place] wanting to clarify MMW ingredients.  Should Benadryl be omitted from the ingredients since only Maalox, Nystatin, and Viscous Lidocaine  Were ordered. Pharmacy's     Phone     [phone].

## 2018-01-21 NOTE — Progress Notes (Signed)
Alicia [name] presents today for injection per the provider's orders.  Neulasta administration without incident; see MAR for injection details.  Patient tolerated procedure well and without incident.  No questions or complaints noted at this time.  Discharged ambulatory.

## 2018-01-22 ENCOUNTER — Telehealth: Payer: Self-pay

## 2018-01-22 NOTE — Telephone Encounter (Signed)
Spoke with associate, Richardson Landry, at [facility] in [place]. Question called in yesterday regarding magic mouthwash. [place] wanted to confirm that benadryl not intended as ingredient in order. Confirmed that ingredients designated maalox, nystatin, and lidocaine, are the desired combination drugs. Richardson Landry verbalized understanding and planned to note confirmation of ingredients and process medication order for pt receipt.

## 2018-01-24 ENCOUNTER — Observation Stay (HOSPITAL_COMMUNITY): Payer: Medicare HMO

## 2018-01-24 ENCOUNTER — Other Ambulatory Visit: Payer: Self-pay

## 2018-01-24 ENCOUNTER — Emergency Department (HOSPITAL_COMMUNITY): Payer: Medicare HMO

## 2018-01-24 ENCOUNTER — Observation Stay (HOSPITAL_COMMUNITY)
Admission: EM | Admit: 2018-01-24 | Discharge: 2018-01-25 | Disposition: A | Discharge status: Home / Self Care | Payer: Medicare HMO | Attending: Internal Medicine | Admitting: Internal Medicine

## 2018-01-24 ENCOUNTER — Observation Stay (HOSPITAL_BASED_OUTPATIENT_CLINIC_OR_DEPARTMENT_OTHER): Payer: Medicare HMO

## 2018-01-24 ENCOUNTER — Encounter (HOSPITAL_COMMUNITY): Payer: Self-pay | Admitting: Emergency Medicine

## 2018-01-24 DIAGNOSIS — C8590 Non-Hodgkin lymphoma, unspecified, unspecified site: Secondary | ICD-10-CM | POA: Diagnosis not present

## 2018-01-24 DIAGNOSIS — I10 Essential (primary) hypertension: Secondary | ICD-10-CM | POA: Diagnosis present

## 2018-01-24 DIAGNOSIS — R0609 Other forms of dyspnea: Secondary | ICD-10-CM | POA: Diagnosis not present

## 2018-01-24 DIAGNOSIS — I351 Nonrheumatic aortic (valve) insufficiency: Secondary | ICD-10-CM | POA: Diagnosis not present

## 2018-01-24 DIAGNOSIS — R0600 Dyspnea, unspecified: Secondary | ICD-10-CM | POA: Diagnosis present

## 2018-01-24 DIAGNOSIS — C8238 Follicular lymphoma grade IIIa, lymph nodes of multiple sites: Secondary | ICD-10-CM | POA: Diagnosis not present

## 2018-01-24 DIAGNOSIS — D696 Thrombocytopenia, unspecified: Secondary | ICD-10-CM | POA: Diagnosis present

## 2018-01-24 DIAGNOSIS — M6281 Muscle weakness (generalized): Secondary | ICD-10-CM | POA: Diagnosis not present

## 2018-01-24 DIAGNOSIS — R0602 Shortness of breath: Secondary | ICD-10-CM | POA: Diagnosis not present

## 2018-01-24 LAB — BLOOD GAS, VENOUS
Acid-Base Excess: 2.2 mmol/L — ABNORMAL HIGH (ref 0.0–2.0)
BICARBONATE: 25.4 mmol/L (ref 20.0–28.0)
FIO2: 21
O2 SAT: 28.8 %
PATIENT TEMPERATURE: 37
pCO2, Ven: 34 mmHg — ABNORMAL LOW (ref 44.0–60.0)
pH, Ven: 7.487 — ABNORMAL HIGH (ref 7.250–7.430)

## 2018-01-24 LAB — CBC WITH DIFFERENTIAL/PLATELET
BASOS ABS: 0 10*3/uL (ref 0.0–0.1)
BASOS PCT: 1 %
EOS PCT: 1 %
Eosinophils Absolute: 0.1 10*3/uL (ref 0.0–0.7)
HEMATOCRIT: 37.1 % (ref 36.0–46.0)
Hemoglobin: 12.2 g/dL (ref 12.0–15.0)
LYMPHS PCT: 9 %
Lymphs Abs: 0.8 10*3/uL (ref 0.7–4.0)
MCH: 29.2 pg (ref 26.0–34.0)
MCHC: 32.9 g/dL (ref 30.0–36.0)
MCV: 88.8 fL (ref 78.0–100.0)
MONO ABS: 0.1 10*3/uL (ref 0.1–1.0)
Monocytes Relative: 1 %
Neutro Abs: 7.8 10*3/uL — ABNORMAL HIGH (ref 1.7–7.7)
Neutrophils Relative %: 88 %
Platelets: 103 10*3/uL — ABNORMAL LOW (ref 150–400)
RBC: 4.18 MIL/uL (ref 3.87–5.11)
RDW: 14.5 % (ref 11.5–15.5)
WBC: 8.8 10*3/uL (ref 4.0–10.5)

## 2018-01-24 LAB — CK: CK TOTAL: 19 U/L — AB (ref 38–234)

## 2018-01-24 LAB — COMPREHENSIVE METABOLIC PANEL
ALBUMIN: 3.7 g/dL (ref 3.5–5.0)
ALT: 17 U/L (ref 14–54)
ANION GAP: 15 (ref 5–15)
AST: 23 U/L (ref 15–41)
Alkaline Phosphatase: 80 U/L (ref 38–126)
BILIRUBIN TOTAL: 1.6 mg/dL — AB (ref 0.3–1.2)
BUN: 20 mg/dL (ref 6–20)
CO2: 22 mmol/L (ref 22–32)
Calcium: 9.7 mg/dL (ref 8.9–10.3)
Chloride: 104 mmol/L (ref 101–111)
Creatinine, Ser: 0.84 mg/dL (ref 0.44–1.00)
GFR calc Af Amer: >60 mL/min (ref >60)
GLUCOSE: 175 mg/dL — AB (ref 65–99)
POTASSIUM: 4.1 mmol/L (ref 3.5–5.1)
Sodium: 141 mmol/L (ref 135–145)
TOTAL PROTEIN: 6 g/dL — AB (ref 6.5–8.1)

## 2018-01-24 LAB — LACTIC ACID, PLASMA
LACTIC ACID, VENOUS: 3.1 mmol/L — AB (ref 0.5–1.9)
LACTIC ACID, VENOUS: 1.2 mmol/L (ref 0.5–1.9)

## 2018-01-24 LAB — URINALYSIS, ROUTINE W REFLEX MICROSCOPIC
BILIRUBIN URINE: NEGATIVE
GLUCOSE, UA: NEGATIVE mg/dL
HGB URINE DIPSTICK: NEGATIVE
KETONES UR: NEGATIVE mg/dL
Leukocytes, UA: NEGATIVE
Nitrite: NEGATIVE
PH: 7 (ref 5.0–8.0)
Protein, ur: NEGATIVE mg/dL
Specific Gravity, Urine: 1.034 — ABNORMAL HIGH (ref 1.005–1.030)

## 2018-01-24 LAB — ECHOCARDIOGRAM COMPLETE
Height: 66 in
WEIGHTICAEL: 2080 [oz_av]

## 2018-01-24 LAB — TROPONIN I
Troponin I: <0.03 ng/mL (ref <0.03)
Troponin I: <0.03 ng/mL (ref <0.03)

## 2018-01-24 LAB — URIC ACID: URIC ACID, SERUM: 3.6 mg/dL (ref 2.3–6.6)

## 2018-01-24 LAB — BRAIN NATRIURETIC PEPTIDE: B NATRIURETIC PEPTIDE 5: 136 pg/mL — AB (ref 0.0–100.0)

## 2018-01-24 LAB — TSH: TSH: 2.026 u[IU]/mL (ref 0.350–4.500)

## 2018-01-24 LAB — PHOSPHORUS: Phosphorus: <1 mg/dL — CL (ref 2.5–4.6)

## 2018-01-24 MED ORDER — SIMVASTATIN 10 MG PO TABS
10.0000 mg | ORAL_TABLET | Freq: Every day | ORAL | Status: DC
  Administered 2018-01-24: 10 mg via ORAL
  Filled 2018-01-24: qty 1

## 2018-01-24 MED ORDER — ENOXAPARIN SODIUM 30 MG/0.3ML ~~LOC~~ SOLN: 30.0000 mg | SUBCUTANEOUS | Status: DC

## 2018-01-24 MED ORDER — EZETIMIBE-SIMVASTATIN 10-10 MG PO TABS: 1.0000 | ORAL_TABLET | Freq: Every day | ORAL | Status: DC

## 2018-01-24 MED ORDER — ONDANSETRON HCL 4 MG PO TABS: 4.0000 mg | ORAL_TABLET | Freq: Four times a day (QID) | ORAL | Status: DC | PRN

## 2018-01-24 MED ORDER — ACETAMINOPHEN 650 MG RE SUPP: 650.0000 mg | Freq: Four times a day (QID) | RECTAL | Status: DC | PRN

## 2018-01-24 MED ORDER — IPRATROPIUM-ALBUTEROL 0.5-2.5 (3) MG/3ML IN SOLN
3.0000 mL | Freq: Once | RESPIRATORY_TRACT | Status: AC
  Administered 2018-01-24: 3 mL via RESPIRATORY_TRACT
  Filled 2018-01-24: qty 3

## 2018-01-24 MED ORDER — SODIUM CHLORIDE 0.9 % IV SOLN
INTRAVENOUS | Status: DC
  Administered 2018-01-24 – 2018-01-25 (×2): via INTRAVENOUS

## 2018-01-24 MED ORDER — LORAZEPAM 0.5 MG PO TABS
0.5000 mg | ORAL_TABLET | Freq: Four times a day (QID) | ORAL | Status: DC | PRN
  Administered 2018-01-24: 0.5 mg via ORAL
  Filled 2018-01-24: qty 1

## 2018-01-24 MED ORDER — ONDANSETRON HCL 4 MG/2ML IJ SOLN: 4.0000 mg | Freq: Four times a day (QID) | INTRAMUSCULAR | Status: DC | PRN

## 2018-01-24 MED ORDER — LIDOCAINE VISCOUS 2 % MT SOLN
5.0000 mL | Freq: Four times a day (QID) | OROMUCOSAL | Status: DC | PRN
Start: 2018-01-24 — End: 2018-01-25

## 2018-01-24 MED ORDER — K PHOS MONO-SOD PHOS DI & MONO 155-852-130 MG PO TABS
500.0000 mg | ORAL_TABLET | Freq: Three times a day (TID) | ORAL | Status: DC
  Administered 2018-01-24 – 2018-01-25 (×3): 500 mg via ORAL
  Filled 2018-01-24 (×13): qty 2

## 2018-01-24 MED ORDER — IOPAMIDOL (ISOVUE-370) INJECTION 76%
100.0000 mL | Freq: Once | INTRAVENOUS | Status: AC | PRN
  Administered 2018-01-24: 100 mL via INTRAVENOUS

## 2018-01-24 MED ORDER — ACETAMINOPHEN 325 MG PO TABS
650.0000 mg | ORAL_TABLET | Freq: Four times a day (QID) | ORAL | Status: DC | PRN
  Administered 2018-01-24: 650 mg via ORAL
  Filled 2018-01-24: qty 2

## 2018-01-24 MED ORDER — BISOPROLOL FUMARATE 5 MG PO TABS
5.0000 mg | ORAL_TABLET | Freq: Two times a day (BID) | ORAL | Status: DC
  Administered 2018-01-25: 5 mg via ORAL
  Filled 2018-01-24 (×2): qty 1

## 2018-01-24 MED ORDER — DIPHENHYDRAMINE HCL 25 MG PO CAPS
25.0000 mg | ORAL_CAPSULE | Freq: Every evening | ORAL | Status: DC | PRN
  Administered 2018-01-24: 25 mg via ORAL
  Filled 2018-01-24: qty 1

## 2018-01-24 MED ORDER — POTASSIUM PHOSPHATES 15 MMOLE/5ML IV SOLN
30.0000 mmol | Freq: Once | INTRAVENOUS | Status: AC
  Administered 2018-01-24: 30 mmol via INTRAVENOUS
  Filled 2018-01-24: qty 10

## 2018-01-24 MED ORDER — EZETIMIBE 10 MG PO TABS
10.0000 mg | ORAL_TABLET | Freq: Every day | ORAL | Status: DC
  Administered 2018-01-24: 10 mg via ORAL
  Filled 2018-01-24: qty 1

## 2018-01-24 MED ORDER — MAGIC MOUTHWASH: 5.0000 mL | Freq: Four times a day (QID) | ORAL | Status: DC | PRN

## 2018-01-24 MED ORDER — MAGIC MOUTHWASH W/LIDOCAINE: 5.0000 mL | Freq: Four times a day (QID) | ORAL | Status: DC | PRN

## 2018-01-24 NOTE — ED Provider Notes (Signed)
Emergency Department Provider Note   I have reviewed the triage vital signs and the nursing notes.   HISTORY  Chief Complaint Shortness of Breath   HPI Alicia [name] is a 77 y.o. female the history of non-Hodgkin's lymphoma currently receiving chemotherapy and her last Neulasta shot was on Fridays, 6 days ago.  She comes in today because of shortness of breath.  She states that intermittently she will have a little bit of shortness of breath with exertion however yesterday it seemed to be a little bit worse but was better with Restasis she just rested.  And then today she is having significant dyspnea on exertion that is worse than anything previous.  Not getting better with rest anymore.  Never anything like this in the past.  No pain to speak of except that she has been having some left leg pain over the last few days.  Initially that was get better with icy hot but is not improving now.  No right lower extremity swelling.  No fevers, cough, orthopnea, PND, nausea, vomiting, diarrhea, constipation or skin color changes. No other associated or modifying symptoms.    Past Medical History:  Diagnosis Date  . Arthritis    back and hips  . GERD (gastroesophageal reflux disease)    TUMS  . Hypercholesterolemia   . Hypertension   . Non Hodgkin's lymphoma [facility])     Patient Active Problem List   Diagnosis Date Noted  . Hypophosphatemia 01/24/2018  . Dyspnea 01/24/2018  . HTN (hypertension) 01/24/2018  . Thrombocytopenia ([place]) 01/24/2018  . 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 Counseling regarding advanced care planning and goals of care 12/20/2017    Past Surgical History:  Procedure Laterality Date  . LYMPH NODE BIOPSY Left 11/27/2017   Procedure: LEFT INGUINAL LYMPH NODE BIOPSY;  Surgeon: Stark Klein, MD;  Location: [place];  Service: General;  Laterality: Left;  . PORTACATH PLACEMENT Left 12/12/2017   Procedure: INSERTION  PORT-A-CATH;  Surgeon: Stark Klein, MD;  Location: [place];  Service: General;  Laterality: Left;  . TONSILLECTOMY        Allergies Patient has no known allergies.  History reviewed. No pertinent family history.  Social History Social History   Tobacco Use  . Smoking status: Never Smoker  . Smokeless tobacco: Never Used  Substance Use Topics  . Alcohol use: No  . Drug use: No    Review of Systems  All other systems negative except as documented in the HPI. All pertinent positives and negatives as reviewed in the HPI. ____________________________________________   PHYSICAL EXAM:  VITAL SIGNS: ED Triage Vitals  Enc Vitals Group     BP 01/24/18 0817 (!) 149/88     Pulse Rate 01/24/18 0817 87     Resp 01/24/18 0817 (!) 24     Temp 01/24/18 0817 (!) 97.5 F (36.4 C)     Temp src --      SpO2 01/24/18 0817 100 %     Weight 01/24/18 0816 130 lb (59 kg)     Height 01/24/18 0816 5\' 6"  (1.676 m)    Constitutional: Alert and oriented. Well appearing and in no acute distress. Eyes: Conjunctivae are normal. PERRL. EOMI. Head: Atraumatic. Nose: No congestion/rhinnorhea. Mouth/Throat: Mucous membranes are moist.  Oropharynx non-erythematous. Neck: No stridor.  No meningeal signs.   Cardiovascular: Normal rate, regular rhythm. Good peripheral circulation. Grossly normal heart sounds.   Respiratory: tachypneic, pursed  lip breathing, moderate respiratory distress with suprasternal retractions. Lungs diminished but CTAB. Gastrointestinal: Soft and nontender. No distention.  Musculoskeletal: No lower extremity tenderness but has LLE edema that is non pitting. No gross deformities of extremities. Neurologic:  Normal speech and language. No gross focal neurologic deficits are appreciated.  Skin:  Skin is warm, dry and intact. No rash noted.   ____________________________________________   LABS (all labs ordered are listed, but only abnormal results are  displayed)  Labs Reviewed  CBC WITH DIFFERENTIAL/PLATELET - Abnormal; Notable for the following components:      Result Value   Platelets 103 (*)    Neutro Abs 7.8 (*)    All other components within normal limits  COMPREHENSIVE METABOLIC PANEL - Abnormal; Notable for the following components:   Glucose, Bld 175 (*)    Total Protein 6.0 (*)    Total Bilirubin 1.6 (*)    All other components within normal limits  LACTIC ACID, PLASMA - Abnormal; Notable for the following components:   Lactic Acid, Venous 3.1 (*)    All other components within normal limits  PHOSPHORUS - Abnormal; Notable for the following components:   Phosphorus <1.0 (*)    All other components within normal limits  TROPONIN I  LACTIC ACID, PLASMA  URIC ACID  URINALYSIS, ROUTINE W REFLEX MICROSCOPIC  BLOOD GAS, VENOUS  BRAIN NATRIURETIC PEPTIDE  TSH  PARATHYROID HORMONE, INTACT (NO CA)  VITAMIN D 25 HYDROXY (VIT D DEFICIENCY, FRACTURES)  CK  TROPONIN I  TROPONIN I  TROPONIN I   ____________________________________________  EKG   EKG Interpretation  Date/Time:  Thursday January 24 2018 08:20:55 EDT Ventricular Rate:  83 PR Interval:    QRS Duration: 94 QT Interval:  376 QTC Calculation: 442 R Axis:   -8 Text Interpretation:  Sinus rhythm Probable left atrial enlargement RSR' in V1 or V2, right VCD or RVH Baseline wander in lead(s) V2 No significant change since last tracing Confirmed by Merrily Pew [phone] on 01/24/2018 8:42:58 AM       ____________________________________________  RADIOLOGY  Dg Chest 2 View  Result Date: 01/24/2018 CLINICAL DATA:  Sudden onset shortness of breath EXAM: CHEST - 2 VIEW COMPARISON:  12/12/2017 FINDINGS: Normal heart size. Stable aortic tortuosity. There is no edema, consolidation, effusion, or pneumothorax. Porta catheter on the left with tip at the SVC origin. IMPRESSION: No evidence of active disease. Electronically Signed   By: Monte Fantasia M.D.   On:  01/24/2018 09:11    ____________________________________________   PROCEDURES  Procedure(s) performed:   Procedures  CRITICAL CARE Performed by: Merrily Pew Total critical care time: 35 minutes Critical care time was exclusive of separately billable procedures and treating other patients. Critical care was necessary to treat or prevent imminent or life-threatening deterioration. Critical care was time spent personally by me on the following activities: development of treatment plan with patient and/or surrogate as well as nursing, discussions with consultants, evaluation of patient's response to treatment, examination of patient, obtaining history from patient or surrogate, ordering and performing treatments and interventions, ordering and review of laboratory studies, ordering and review of radiographic studies, pulse oximetry and re-evaluation of patient's condition.  ____________________________________________   INITIAL IMPRESSION / ASSESSMENT AND PLAN / ED COURSE  At this time is unclear what the etiology of her dyspnea is.  She does have a slightly swollen left lower extremity and had some pain in it previously and with a history of cancer and obviously pulmonary embolus is on the differential.  If her chest x-ray is clear and has no other source found will CT her for the same.  Could also consider new onset ACS that is an anginal equivalent of dyspnea on exertion so an EKG and troponin will be ordered.  Also considered tumor lysis syndrome and hypercarbia has been a metabolic dyspnea which also is likely so we will get a blood gas, uric acid, phosphorus, lactic acid and CMP.  Does not appear to have a fever at this time think infectious is less likely without any symptoms of infection or cough.  Severe phosphatemia which apparently can ccause respiratory issues. Will replete and admit for further management.      Pertinent labs & imaging results that were available during my  care of the patient were reviewed by me and considered in my medical decision making (see chart for details).  ____________________________________________  FINAL CLINICAL IMPRESSION(S) / ED DIAGNOSES  Final diagnoses:  Hypophosphatemia     MEDICATIONS GIVEN DURING THIS VISIT:  Medications  potassium PHOSPHATE 30 mmol in dextrose 5 % 500 mL infusion (30 mmol Intravenous New Bag/Given 01/24/18 0950)  ezetimibe-simvastatin (VYTORIN) 10-10 MG per tablet 1 tablet (has no administration in time range)  LORazepam (ATIVAN) tablet 0.5 mg (has no administration in time range)  magic mouthwash w/lidocaine (has no administration in time range)  bisoprolol (ZEBETA) tablet 5 mg (has no administration in time range)  phosphorus (K PHOS NEUTRAL) tablet 500 mg (has no administration in time range)  0.9 %  sodium chloride infusion (has no administration in time range)  acetaminophen (TYLENOL) tablet 650 mg (has no administration in time range)    Or  acetaminophen (TYLENOL) suppository 650 mg (has no administration in time range)  ondansetron (ZOFRAN) tablet 4 mg (has no administration in time range)    Or  ondansetron (ZOFRAN) injection 4 mg (has no administration in time range)  ipratropium-albuterol (DUONEB) 0.5-2.5 (3) MG/3ML nebulizer solution 3 mL (3 mLs Nebulization Given 01/24/18 0904)     NEW OUTPATIENT MEDICATIONS STARTED DURING THIS VISIT:  Current Discharge Medication List      Note:  This note was prepared with assistance of Dragon voice recognition software. Occasional wrong-word or sound-a-like substitutions may have occurred due to the inherent limitations of voice recognition software.   Merrily Pew, MD 01/24/18 [phone]

## 2018-01-24 NOTE — ED Notes (Signed)
Date and time results received: 01/24/18 1018 (use smartphrase ".now" to insert current time)  Test: LAC ACID Critical Value: 3.1  Name of Provider Notified: MESNER  Orders Received? Or Actions Taken?: NONE

## 2018-01-24 NOTE — ED Triage Notes (Signed)
Pt c/o sudden onset of sob at home this am.

## 2018-01-24 NOTE — ED Notes (Signed)
Date and time results received: 01/24/18 0919 (use smartphrase ".now" to insert current time)  Test: PHROSP Critical Value: <1.0  Name of Provider Notified: MESNER Orders Received? Or Actions Taken?: NONE

## 2018-01-24 NOTE — H&P (Signed)
History and Physical    Alicia [name] IOE:[id number] DOB: Jun 01, 1941 DOA: 01/24/2018  PCP: Celene Squibb, MD  Patient coming from: home  I have personally briefly reviewed patient's old medical records in [place]  Chief Complaint: shortness of breath  HPI: Alicia [name] is a 77 y.o. 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  Most recent chemotherapy was almost 1 week ago.  Patient presents to the hospital today with complaints of shortness of breath.  Symptoms began yesterday in the morning and was self-limited.  She particularly has dyspnea on exertion, which improves with rest.  Denies any chest pain.  She has not had any fever, wheezing or cough.  No nausea or vomiting.  This morning when she woke up her dyspnea was particularly worse and she came to the hospital for evaluation.  ED Course: Basic labs were noted to be relatively unremarkable other than trauma cytopenia which is felt to be related to her chemotherapy.  Interestingly, phosphorus level was noted to be less than 1.  Remainder of electrolytes were unrevealing.  Chest x-ray did not show any acute findings and EKG did not show any acute findings.  Review of Systems: As per HPI otherwise 10 point review of systems negative.    Past Medical History:  Diagnosis Date  . Arthritis    back and hips  . GERD (gastroesophageal reflux disease)    TUMS  . Hypercholesterolemia   . Hypertension   . Non Hodgkin's lymphoma [facility])     Past Surgical History:  Procedure Laterality Date  . LYMPH NODE BIOPSY Left 11/27/2017   Procedure: LEFT INGUINAL LYMPH NODE BIOPSY;  Surgeon: Stark Klein, MD;  Location: [place];  Service: General;  Laterality: Left;  . PORTACATH PLACEMENT Left 12/12/2017   Procedure: INSERTION PORT-A-CATH;  Surgeon: Stark Klein, MD;  Location: [place];  Service: General;  Laterality: Left;  . TONSILLECTOMY       reports that she  has never smoked. She has never used smokeless tobacco. She reports that she does not drink alcohol or use drugs.  No Known Allergies  Family History: mother had breast cancer, brother has alcohol abuse  Prior to Admission medications   Medication Sig Start Date End Date Taking? Authorizing Provider  acetaminophen (TYLENOL) 500 MG tablet Take 500 mg by mouth at bedtime.   Yes [provider]  bisoprolol-hydrochlorothiazide (ZIAC) 5-6.25 MG tablet Take 1 tablet by mouth 2 (two) times daily.   Yes [provider]  diphenhydramine-acetaminophen (TYLENOL PM) 25-500 MG TABS tablet Take 1 tablet by mouth at bedtime as needed.   Yes [provider]  ezetimibe-simvastatin (VYTORIN) 10-10 MG tablet Take 1 tablet by mouth at bedtime.   Yes [provider]  lidocaine-prilocaine (EMLA) cream Apply to affected area once 12/20/17  Yes Kale, Cloria [place], MD  LORazepam (ATIVAN) 0.5 MG tablet Take 1 tablet (0.5 mg total) by mouth every 6 (six) hours as needed (Nausea or vomiting). 12/20/17  Yes Brunetta Genera, MD  magic mouthwash w/lidocaine SOLN Take 5 mLs by mouth 4 (four) times daily as needed for mouth pain. 1 part maalox, 1 part nystain, 1 part viscous lidocaine 01/21/18  Yes Brunetta Genera, MD  Melatonin 10 MG TABS Take 10 mg by mouth daily.   Yes [provider]  naproxen sodium (ALEVE) 220 MG tablet Take 220 mg by mouth daily as needed (pain).   Yes [provider]  omega-3 acid ethyl esters (LOVAZA) 1 g capsule Take 1 g by mouth daily.    Yes [provider]  ondansetron (ZOFRAN) 8 MG tablet Take 1 tablet (8 mg total) by mouth 2 (two) times daily as needed for refractory nausea / vomiting. From day 3 after chemotherapy. 12/20/17  Yes Brunetta Genera, MD  predniSONE (DELTASONE) 20 MG tablet Take 3 tablets (60 mg total) by mouth daily. Take on days 1-5 of chemotherapy. 12/20/17  Yes Brunetta Genera, MD  prochlorperazine  (COMPAZINE) 10 MG tablet Take 1 tablet (10 mg total) by mouth every 6 (six) hours as needed (Nausea or vomiting). 12/20/17  Yes Brunetta Genera, MD  senna-docusate (SENNA S) 8.6-50 MG tablet Take 2 tablets by mouth 2 (two) times daily. May reduce to 2 tab po HS once bowel movements established. 01/03/18  Yes Brunetta Genera, MD    Physical Exam: Vitals:   01/24/18 1200 01/24/18 1230 01/24/18 1300 01/24/18 1323  BP: 133/87 124/66 135/82 (!) 166/79  Pulse:    64  Resp: 18 16 18 18   Temp:    97.9 F (36.6 C)  TempSrc:    Oral  SpO2:    97%  Weight:    59 kg (130 lb)  Height:    5\' 6"  (1.676 m)    Constitutional: NAD, calm, comfortable Vitals:   01/24/18 1200 01/24/18 1230 01/24/18 1300 01/24/18 1323  BP: 133/87 124/66 135/82 (!) 166/79  Pulse:    64  Resp: 18 16 18 18   Temp:    97.9 F (36.6 C)  TempSrc:    Oral  SpO2:    97%  Weight:    59 kg (130 lb)  Height:    5\' 6"  (1.676 m)   Eyes: PERRL, lids and conjunctivae normal ENMT: Mucous membranes are moist. Posterior pharynx clear of any exudate or lesions.Normal dentition.  Neck: normal, supple, no masses, no thyromegaly Respiratory: clear to auscultation bilaterally, no wheezing, no crackles. Normal respiratory effort. No accessory muscle use.  Cardiovascular: Regular rate and rhythm, no murmurs / rubs / gallops. 2+ pedal pulses. No carotid bruits.  Abdomen: no tenderness, no masses palpated. No hepatosplenomegaly. Bowel sounds positive.  Musculoskeletal: mild edema in the LLE when compared to right. Negative Homans sign, no erythema Skin: no rashes, lesions, ulcers. No induration Neurologic: CN 2-12 grossly intact. Sensation intact, DTR normal. Strength 5/5 in all 4.  Psychiatric: Normal judgment and insight. Alert and oriented x 3. Normal mood.   Labs on Admission: I have personally reviewed following labs and imaging studies  CBC: Recent Labs  Lab 01/18/18 0910 01/24/18 0835  WBC 11.1* 8.8  NEUTROABS 9.5*  7.8*  HGB  --  12.2  HCT 37.7 37.1  MCV 88.7 88.8  PLT 292 884*   Basic Metabolic Panel: Recent Labs  Lab 01/18/18 0910 01/24/18 0835  NA 143 141  K 3.7 4.1  CL 108 104  CO2 28 22  GLUCOSE 136 175*  BUN 13 20  CREATININE 0.96 0.84  CALCIUM 9.4 9.7  PHOS  --  <1.0*   GFR: Estimated Creatinine Clearance: 52.2 mL/min (by C-G formula based on SCr of 0.84 mg/dL). Liver Function Tests: Recent Labs  Lab 01/18/18 0910 01/24/18 0835  AST 18 23  ALT 16 17  ALKPHOS 71 80  BILITOT 0.6 1.6*  PROT 6.1* 6.0*  ALBUMIN 3.5 3.7   No results for input(s): LIPASE, AMYLASE in the last 168 hours. No results for input(s): AMMONIA in  the last 168 hours. Coagulation Profile: No results for input(s): INR, PROTIME in the last 168 hours. Cardiac Enzymes: Recent Labs  Lab 01/24/18 0835  TROPONINI <0.03   BNP (last 3 results) No results for input(s): PROBNP in the last 8760 hours. HbA1C: No results for input(s): HGBA1C in the last 72 hours. CBG: No results for input(s): GLUCAP in the last 168 hours. Lipid Profile: No results for input(s): CHOL, HDL, LDLCALC, TRIG, CHOLHDL, LDLDIRECT in the last 72 hours. Thyroid Function Tests: No results for input(s): TSH, T4TOTAL, FREET4, T3FREE, THYROIDAB in the last 72 hours. Anemia Panel: No results for input(s): VITAMINB12, FOLATE, FERRITIN, TIBC, IRON, RETICCTPCT in the last 72 hours. Urine analysis: No results found for: COLORURINE, APPEARANCEUR, LABSPEC, [place], GLUCOSEU, HGBUR, BILIRUBINUR, KETONESUR, PROTEINUR, UROBILINOGEN, NITRITE, LEUKOCYTESUR  Radiological Exams on Admission: Dg Chest 2 View  Result Date: 01/24/2018 CLINICAL DATA:  Sudden onset shortness of breath EXAM: CHEST - 2 VIEW COMPARISON:  12/12/2017 FINDINGS: Normal heart size. Stable aortic tortuosity. There is no edema, consolidation, effusion, or pneumothorax. Porta catheter on the left with tip at the SVC origin. IMPRESSION: No evidence of active disease. Electronically  Signed   By: Monte Fantasia M.D.   On: 01/24/2018 09:11    EKG: Independently reviewed. 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 Dyspnea.  Unclear etiology.  Possibly could be related to hypophosphatemia.  With her history of malignancy and relatively rapid onset of symptoms, will perform CT angio of chest to rule out underlying pulmonary embolus.  Check echocardiogram, cycle cardiac markers. 2. Hypophosphatemia.  She is receiving potassium phosphate intravenously in the emergency room.  We will also start on oral supplementation.  Repeat labs in a.m.  Etiology is unclear.  Uric acid is normal.  Check thyroid studies as well as parathyroid.  Check CK level, vitamin D.  If labs are unrevealing and phosphorus remains low, may need to consider 24-hour urine for phosphorus excretion. 3. Thrombocytopenia.  Related to recent chemotherapy.  Continue to follow CBC. 4. Hypertension.  Continue bisoprolol.  Will hold hydrochlorothiazide for now. 5. Elevated lactic acid.  She does not appear septic or toxic.  Possibly related some mild volume depletion.  This is improved since original check.  Continue on some gentle hydration. 6. Lymphoma.  Follow-up with her primary oncologist for continued treatment.  DVT prophylaxis: TED hose Code Status: DNR Family Communication: discussed with husband and daughter at the bedside Disposition Plan: discharge home once improved Consults called:  Admission status: observation, tele   Kathie Dike MD [facility] Pager [phone]  If 7PM-7AM, please contact night-coverage [email or web address] Password [facility]  01/24/2018, 3:16 PM

## 2018-01-24 NOTE — Progress Notes (Signed)
*  PRELIMINARY RESULTS* Echocardiogram 2D Echocardiogram has been performed.  Alicia [name] 01/24/2018, 4:22 PM

## 2018-01-25 DIAGNOSIS — R0609 Other forms of dyspnea: Secondary | ICD-10-CM | POA: Diagnosis not present

## 2018-01-25 DIAGNOSIS — T451X5A Adverse effect of antineoplastic and immunosuppressive drugs, initial encounter: Secondary | ICD-10-CM | POA: Diagnosis not present

## 2018-01-25 DIAGNOSIS — D701 Agranulocytosis secondary to cancer chemotherapy: Secondary | ICD-10-CM

## 2018-01-25 DIAGNOSIS — C8238 Follicular lymphoma grade IIIa, lymph nodes of multiple sites: Secondary | ICD-10-CM | POA: Diagnosis not present

## 2018-01-25 LAB — CBC WITH DIFFERENTIAL/PLATELET
BASOS ABS: 0.1 10*3/uL (ref 0.0–0.1)
BASOS PCT: 5 %
EOS ABS: 0.1 10*3/uL (ref 0.0–0.7)
Eosinophils Relative: 10 %
HCT: 34.1 % — ABNORMAL LOW (ref 36.0–46.0)
Hemoglobin: 11 g/dL — ABNORMAL LOW (ref 12.0–15.0)
LYMPHS PCT: 49 %
Lymphs Abs: 0.5 10*3/uL — ABNORMAL LOW (ref 0.7–4.0)
MCH: 28.9 pg (ref 26.0–34.0)
MCHC: 32.3 g/dL (ref 30.0–36.0)
MCV: 89.5 fL (ref 78.0–100.0)
MONO ABS: 0.1 10*3/uL (ref 0.1–1.0)
Monocytes Relative: 6 %
NEUTROS PCT: 30 %
Neutro Abs: 0.3 10*3/uL — ABNORMAL LOW (ref 1.7–7.7)
PLATELETS: 74 10*3/uL — AB (ref 150–400)
RBC: 3.81 MIL/uL — AB (ref 3.87–5.11)
RDW: 14.6 % (ref 11.5–15.5)
WBC Morphology: INCREASED
WBC: 1.1 10*3/uL — AB (ref 4.0–10.5)

## 2018-01-25 LAB — COMPREHENSIVE METABOLIC PANEL
ALK PHOS: 56 U/L (ref 38–126)
ALT: 14 U/L (ref 14–54)
AST: 13 U/L — ABNORMAL LOW (ref 15–41)
Albumin: 3 g/dL — ABNORMAL LOW (ref 3.5–5.0)
Anion gap: 7 (ref 5–15)
BILIRUBIN TOTAL: 0.9 mg/dL (ref 0.3–1.2)
BUN: 15 mg/dL (ref 6–20)
CALCIUM: 8.2 mg/dL — AB (ref 8.9–10.3)
CO2: 25 mmol/L (ref 22–32)
CREATININE: 0.63 mg/dL (ref 0.44–1.00)
Chloride: 109 mmol/L (ref 101–111)
GFR calc Af Amer: >60 mL/min (ref >60)
GLUCOSE: 98 mg/dL (ref 65–99)
POTASSIUM: 3.9 mmol/L (ref 3.5–5.1)
Sodium: 141 mmol/L (ref 135–145)
TOTAL PROTEIN: 5.2 g/dL — AB (ref 6.5–8.1)

## 2018-01-25 LAB — CBC
HEMATOCRIT: 32.1 % — AB (ref 36.0–46.0)
Hemoglobin: 10.5 g/dL — ABNORMAL LOW (ref 12.0–15.0)
MCH: 29.2 pg (ref 26.0–34.0)
MCHC: 32.7 g/dL (ref 30.0–36.0)
MCV: 89.2 fL (ref 78.0–100.0)
PLATELETS: 77 10*3/uL — AB (ref 150–400)
RBC: 3.6 MIL/uL — ABNORMAL LOW (ref 3.87–5.11)
RDW: 14.6 % (ref 11.5–15.5)
WBC: 1.8 10*3/uL — AB (ref 4.0–10.5)

## 2018-01-25 LAB — TROPONIN I

## 2018-01-25 LAB — PARATHYROID HORMONE, INTACT (NO CA): PTH: 42 pg/mL (ref 15–65)

## 2018-01-25 LAB — PHOSPHORUS: Phosphorus: 3.7 mg/dL (ref 2.5–4.6)

## 2018-01-25 LAB — VITAMIN D 25 HYDROXY (VIT D DEFICIENCY, FRACTURES): Vit D, 25-Hydroxy: 19.9 ng/mL — ABNORMAL LOW (ref 30.0–100.0)

## 2018-01-25 NOTE — Evaluation (Signed)
Physical Therapy Evaluation Patient Details Name: Alicia [name] MRN: [id number] DOB: [birth year] Today's Date: 01/25/2018   History of Present Illness  Alicia [name] is a 77 y.o. 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  Most recent chemotherapy was almost 1 week ago.  Patient presents to the hospital today with complaints of shortness of breath.  Symptoms began yesterday in the morning and was self-limited.  She particularly has dyspnea on exertion, which improves with rest.  Denies any chest pain.  She has not had any fever, wheezing or cough.  No nausea or vomiting.  This morning when she woke up her dyspnea was particularly worse and she came to the hospital for evaluation.    Clinical Impression  Patient evaluated by Physical Therapy with no further acute PT needs identified. All education has been completed and the patient has no further questions. Patient performed bed mobility and transfers independently. Patient able to ambulate without assistive devices covering 250 feet without evidence of fatigue or LOBs. See below for any follow-up Physical Therapy or equipment needs. PT is signing off. Thank you for this referral.     Follow Up Recommendations No PT follow up    Equipment Recommendations  None recommended by PT    Recommendations for Other Services       Precautions / Restrictions Precautions Precautions: None Restrictions Weight Bearing Restrictions: No      Mobility  Bed Mobility Overal bed mobility: Independent                Transfers Overall transfer level: Independent                  Ambulation/Gait Ambulation/Gait assistance: Independent Ambulation Distance (Feet): 250 Feet Assistive device: None Gait Pattern/deviations: Step-through pattern;Decreased stride length;Decreased step length - right;Decreased step length - left     General Gait Details: PT pushed IV pole; ambulated  with fair pace and fair to good dynamic balance and fair arm swing  Stairs            Wheelchair Mobility    Modified Rankin (Stroke Patients Only)       Balance Overall balance assessment: Mild deficits observed, not formally tested                                           Pertinent Vitals/Pain Pain Assessment: No/denies pain    Home Living Family/patient expects to be discharged to:: Private residence Living Arrangements: Alone Available Help at Discharge: Family Type of Home: House Home Access: Stairs to enter Entrance Stairs-Rails: Right Entrance Stairs-Number of Steps: 2 Home Layout: One level Home Equipment: Environmental consultant - 2 wheels;Shower seat;Bedside commode;Grab bars - tub/shower      Prior Function Level of Independence: Independent               Hand Dominance        Extremity/Trunk Assessment   Upper Extremity Assessment Upper Extremity Assessment: Overall WFL for tasks assessed    Lower Extremity Assessment Lower Extremity Assessment: Overall WFL for tasks assessed    Cervical / Trunk Assessment Cervical / Trunk Assessment: Normal  Communication   Communication: No difficulties  Cognition Arousal/Alertness: Awake/alert Behavior During Therapy: WFL for tasks assessed/performed Overall Cognitive Status: Within Functional Limits for tasks assessed  General Comments      Exercises     Assessment/Plan    PT Assessment Patent does not need any further PT services  PT Problem List         PT Treatment Interventions      PT Goals (Current goals can be found in the Care Plan section)  Acute Rehab PT Goals Patient Stated Goal: go home today PT Goal Formulation: With patient Time For Goal Achievement: 02/01/18 Potential to Achieve Goals: Good    Frequency     Barriers to discharge        Co-evaluation               AM-PAC PT "6 Clicks" Daily  Activity  Outcome Measure Difficulty turning over in bed (including adjusting bedclothes, sheets and blankets)?: None Difficulty moving from lying on back to sitting on the side of the bed? : None Difficulty sitting down on and standing up from a chair with arms (e.g., wheelchair, bedside commode, etc,.)?: None Help needed moving to and from a bed to chair (including a wheelchair)?: None Help needed walking in hospital room?: None Help needed climbing 3-5 steps with a railing? : A Little 6 Click Score: 23    End of Session   Activity Tolerance: Patient tolerated treatment well Patient left: in chair;with call bell/phone within reach Nurse Communication: Mobility status PT Visit Diagnosis: Muscle weakness (generalized) (M62.81);Difficulty in walking, not elsewhere classified (R26.2)    Time: 7412-8786 PT Time Calculation (min) (ACUTE ONLY): 25 min   Charges:   PT Evaluation $PT Eval Low Complexity: 1 Low PT Treatments $Gait Training: 8-22 mins   PT G Codes:        Dulcemaria Bula D. Hartnett-Rands, MS, PT Per [place] [phone] 01/25/2018, 11:08 AM

## 2018-01-25 NOTE — Progress Notes (Signed)
Pt IV removed, WNL. D/C instructions given to pt. Verbalized understanding. Family member at bedside to transport pt home.

## 2018-01-25 NOTE — Care Management Obs Status (Signed)
[place] NOTIFICATION   Patient Details  Name: Alicia [name] MRN: [id number] Date of Birth: 08-11-1941   Medicare Observation Status Notification Given:  Yes    Sherald Barge, RN 01/25/2018, 1:23 PM

## 2018-01-25 NOTE — Discharge Summary (Signed)
Discharge Summary  Alicia [name]:[id number] DOB: 1941/08/30  PCP: Celene Squibb, MD  Admit date: 01/24/2018 Discharge date: 01/25/2018  Time spent: 35 minutes   Recommendations for Outpatient Follow-up:  1. Follow-up with oncology as scheduled next 2 weeks. 2. Patient advised that should she have a fever greater than 100 anytime, to immediately come into the emergency room  Discharge Diagnoses:  Active Hospital Problems   Diagnosis Date Noted  . Hypophosphatemia 01/24/2018  . Dyspnea 01/24/2018  . HTN (hypertension) 01/24/2018  . Thrombocytopenia ([place]) 01/24/2018  . 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  No fever or wheezing or cough.  Lab work was unremarkable other than thrombocytopenia and significant hypophosphatemia with a phosphorus level less than 1.  Chest x-ray unrevealing.  EKG unrevealing as well.  Patient brought in for further evaluation, started on phosphorus replacement therapy.  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  Pulmonary embolus ruled out by CT scan.  Echocardiogram unrevealing.  And once patient's phosphorus levels were replaced, she felt back to her baseline. Neutropenia: Patient's white blood cell count normal on admission, however  on 4/12, down to 1.8.  Repeat CBC with differential confirmed white blood cell count of 1.1 with absolute neutrophil count of 0.3.  Discussed this case with oncology on-call.  Given the fact that patient came in for unrelated issues and was afebrile with other vital signs stable and no other signs of infection, did not feel that she had an acute active infection.  She also received Neulasta at the beginning of the week on Monday, 4/8.  Given no signs of acute infection and recent Neulasta, felt to be okay for discharge.  Patient advised that if she does have any kind of fever greater than 100 to immediately come to the emergency room.  She says she will.   HTN (hypertension): BP stable, continue home medications   Thrombocytopenia ([place]): Stable, felt to be secondary to chemotherapy.  No active bleeding   Procedures:  Echocardiogram: Preserved ejection fraction, no evidence of diastolic dysfunction  Consultations:  Case discussed with oncology on-call, Dr.Katragadda  Discharge Exam: BP (!) 134/92 (BP Location: Left Arm)   Pulse 78   Temp 98.5 F (36.9 C)   Resp 19   Ht 5\' 6"  (1.676 m)   Wt 59 kg (130 lb)   SpO2 99%   BMI 20.98 kg/m   General: Alert and oriented x3, no acute distress Cardiovascular: Regular rate and rhythm, S1-S2 Respiratory: Clear to auscultation bilaterally  Discharge Instructions You were cared for by a hospitalist during your hospital stay. If you have any questions about your discharge medications or the care you received while you were in the hospital after you are discharged, you can call the unit and  asked to speak with the hospitalist on call if the hospitalist that took care of you is not available. Once you are discharged, your primary care physician will handle any further medical issues. Please note that NO REFILLS for any discharge medications will be authorized once you are discharged, as it is imperative that you return to your primary care physician (or  establish a relationship with a primary care physician if you do not have one) for your aftercare needs so that they can reassess your need for medications and monitor your lab values.  Discharge Instructions    Call MD for:  temperature >100.4   Complete by:  As directed    Come to the ER if temp >100   Diet - low sodium heart healthy   Complete by:  As directed    Increase activity slowly   Complete by:  As directed      Allergies as of 01/25/2018   No Known Allergies     Medication List    TAKE these medications   acetaminophen 500 MG tablet Commonly known as:  TYLENOL Take 500 mg by mouth at bedtime.   diphenhydramine-acetaminophen 25-500 MG Tabs tablet Commonly known as:  TYLENOL PM Take 1 tablet by mouth at bedtime as needed.   ezetimibe-simvastatin 10-10 MG tablet Commonly known as:  VYTORIN Take 1 tablet by mouth at bedtime.   lidocaine-prilocaine cream Commonly known as:  EMLA Apply to affected area once   LORazepam 0.5 MG tablet Commonly known as:  ATIVAN Take 1 tablet (0.5 mg total) by mouth every 6 (six) hours as needed (Nausea or vomiting).   magic mouthwash w/lidocaine Soln Take 5 mLs by mouth 4 (four) times daily as needed for mouth pain. 1 part maalox, 1 part nystain, 1 part viscous lidocaine   Melatonin 10 MG Tabs Take 10 mg by mouth daily.   naproxen sodium 220 MG tablet Commonly known as:  ALEVE Take 220 mg by mouth daily as needed (pain).   omega-3 acid ethyl esters 1 g capsule Commonly known as:  LOVAZA Take 1 g by mouth daily.   ondansetron 8 MG tablet Commonly known as:  ZOFRAN Take 1 tablet (8 mg total) by mouth 2 (two) times daily as needed for refractory nausea / vomiting. From day 3 after chemotherapy.   predniSONE 20 MG tablet Commonly known as:  DELTASONE Take 3 tablets (60 mg total) by mouth daily. Take on days 1-5 of chemotherapy.   prochlorperazine 10 MG tablet Commonly known as:  COMPAZINE Take 1 tablet (10 mg total) by  mouth every 6 (six) hours as needed (Nausea or vomiting).   senna-docusate 8.6-50 MG tablet Commonly known as:  SENNA S Take 2 tablets by mouth 2 (two) times daily. May reduce to 2 tab po HS once bowel movements established.   ZIAC 5-6.25 MG tablet Generic drug:  bisoprolol-hydrochlorothiazide Take 1 tablet by mouth 2 (two) times daily.      No Known Allergies Follow-up Information    Brunetta Genera, MD Follow up in 2 week(s).   Specialties:  Hematology, Oncology Why:  As scheduled Contact information: [address] [place] [address] [phone]            The results of significant diagnostics from this hospitalization (including imaging, microbiology, ancillary and laboratory) are listed below for reference.    Significant Diagnostic Studies: Dg Chest 2 View  Result Date: 01/24/2018 CLINICAL DATA:  Sudden onset shortness of breath EXAM: CHEST - 2  VIEW COMPARISON:  12/12/2017 FINDINGS: Normal heart size. Stable aortic tortuosity. There is no edema, consolidation, effusion, or pneumothorax. Porta catheter on the left with tip at the SVC origin. IMPRESSION: No evidence of active disease. Electronically Signed   By: Monte Fantasia M.D.   On: 01/24/2018 09:11   Ct Angio Chest Pe W Or Wo Contrast  Result Date: 01/24/2018 CLINICAL DATA:  Hypertension and shortness of breath. History of non-Hodgkin's lymphoma. EXAM: CT ANGIOGRAPHY CHEST WITH CONTRAST TECHNIQUE: Multidetector CT imaging of the chest was performed using the standard protocol during bolus administration of intravenous contrast. Multiplanar CT image reconstructions and MIPs were obtained to evaluate the vascular anatomy. CONTRAST:  168mL ISOVUE-370 IOPAMIDOL (ISOVUE-370) INJECTION 76% COMPARISON:  PET-CT 11/19/2017 FINDINGS: Cardiovascular: The heart is borderline enlarged but stable. No pericardial effusion. Stable tortuosity and fusiform ectasia of the descending thoracic aorta. No dissection. Minimal  atherosclerotic calcifications. Normal caliber ascending aorta. No definite coronary artery calcifications. The pulmonary arterial tree is fairly well opacified. No filling defects to suggest pulmonary embolism. Mediastinum/Nodes: No mediastinal or hilar lymphadenopathy. The esophagus is grossly normal. Lungs/Pleura: Streaky bibasilar atelectasis. No infiltrates or effusions. No worrisome pulmonary lesions. Upper Abdomen: No significant upper abdominal findings. Musculoskeletal: No significant bony findings Review of the MIP images confirms the above findings. IMPRESSION: 1. No CT findings for pulmonary embolism. 2. Tortuosity and ectasia of the thoracic aorta but no focal aneurysm or dissection. 3. No significant pulmonary findings. Streaky basilar atelectasis. No worrisome pulmonary lesions. 4. No mediastinal or hilar mass or lymphadenopathy. Aortic Atherosclerosis (ICD10-I70.0). Electronically Signed   By: Marijo Sanes M.D.   On: 01/24/2018 19:56    Microbiology: No results found for this or any previous visit (from the past 240 hour(s)).   Labs: Basic Metabolic Panel: Recent Labs  Lab 01/24/18 0835 01/25/18 0338  NA 141 141  K 4.1 3.9  CL 104 109  CO2 22 25  GLUCOSE 175* 98  BUN 20 15  CREATININE 0.84 0.63  CALCIUM 9.7 8.2*  PHOS <1.0* 3.7   Liver Function Tests: Recent Labs  Lab 01/24/18 0835 01/25/18 0338  AST 23 13*  ALT 17 14  ALKPHOS 80 56  BILITOT 1.6* 0.9  PROT 6.0* 5.2*  ALBUMIN 3.7 3.0*   No results for input(s): LIPASE, AMYLASE in the last 168 hours. No results for input(s): AMMONIA in the last 168 hours. CBC: Recent Labs  Lab 01/24/18 0835 01/25/18 0338 01/25/18 1214  WBC 8.8 1.8* 1.1*  NEUTROABS 7.8*  --  0.3*  HGB 12.2 10.5* 11.0*  HCT 37.1 32.1* 34.1*  MCV 88.8 89.2 89.5  PLT 103* 77* 74*   Cardiac Enzymes: Recent Labs  Lab 01/24/18 0835 01/24/18 1617 01/24/18 2117 01/25/18 0338  CKTOTAL  --  19*  --   --   TROPONINI <0.03 <0.03 <0.03 <0.03     BNP: BNP (last 3 results) Recent Labs    01/24/18 1617  BNP 136.0*    ProBNP (last 3 results) No results for input(s): PROBNP in the last 8760 hours.  CBG: No results for input(s): GLUCAP in the last 168 hours.     Signed:  Annita Brod, MD [facility] 01/25/2018, 3:14 PM

## 2018-01-25 NOTE — Plan of Care (Signed)
  Problem: Acute Rehab PT Goals(only PT should resolve) Goal: Patient Will Transfer Sit To/From Stand Outcome: Adequate for Discharge Flowsheets (Taken 01/25/2018 1110) Patient will transfer sit to/from stand: Independently Goal: Pt Will Ambulate Outcome: Adequate for Discharge Flowsheets (Taken 01/25/2018 1110) Pt will Ambulate: > 125 feet;Independently Goal: Pt Will Go Up/Down Stairs Outcome: Adequate for Discharge Flowsheets (Taken 01/25/2018 1110) Pt will Go Up / Down Stairs: 1-2 stairs;Independently   Floria Raveling. Hartnett-Rands, MS, PT Per [place] [phone] 01/25/2018

## 2018-01-25 NOTE — Progress Notes (Signed)
CRITICAL VALUE ALERT  Critical Value:  WBC 1.1  Date & Time Notied:  01/25/2018 @ 1245  Provider Notified: PAGED   Orders Received/Actions taken: AWAITING RESPONSE

## 2018-01-28 ENCOUNTER — Other Ambulatory Visit: Payer: Self-pay | Admitting: Medical

## 2018-01-28 ENCOUNTER — Inpatient Hospital Stay: Payer: Medicare HMO | Admitting: Medical

## 2018-01-28 ENCOUNTER — Inpatient Hospital Stay (HOSPITAL_BASED_OUTPATIENT_CLINIC_OR_DEPARTMENT_OTHER): Payer: Medicare HMO | Admitting: Medical

## 2018-01-28 VITALS — BP 105/76 | HR 76 | Temp 97.7°F | Resp 18 | Ht 66.0 in | Wt 130.3 lb

## 2018-01-28 DIAGNOSIS — R0609 Other forms of dyspnea: Secondary | ICD-10-CM | POA: Diagnosis not present

## 2018-01-28 DIAGNOSIS — I959 Hypotension, unspecified: Secondary | ICD-10-CM | POA: Diagnosis not present

## 2018-01-28 DIAGNOSIS — C8590 Non-Hodgkin lymphoma, unspecified, unspecified site: Secondary | ICD-10-CM

## 2018-01-28 DIAGNOSIS — E876 Hypokalemia: Secondary | ICD-10-CM | POA: Diagnosis not present

## 2018-01-28 DIAGNOSIS — C8285 Other types of follicular lymphoma, lymph nodes of inguinal region and lower limb: Secondary | ICD-10-CM

## 2018-01-28 DIAGNOSIS — R0600 Dyspnea, unspecified: Secondary | ICD-10-CM

## 2018-01-28 DIAGNOSIS — Z5112 Encounter for antineoplastic immunotherapy: Secondary | ICD-10-CM | POA: Diagnosis not present

## 2018-01-28 LAB — CBC WITH DIFFERENTIAL (CANCER CENTER ONLY)
BASOS ABS: 0.3 10*3/uL — AB (ref 0.0–0.1)
BASOS PCT: 2 %
Eosinophils Absolute: 0.1 10*3/uL (ref 0.0–0.5)
Eosinophils Relative: 1 %
HEMATOCRIT: 36.6 % (ref 34.8–46.6)
HEMOGLOBIN: 12 g/dL (ref 11.6–15.9)
Lymphocytes Relative: 9 %
Lymphs Abs: 1.3 10*3/uL (ref 0.9–3.3)
MCH: 28.9 pg (ref 25.1–34.0)
MCHC: 32.6 g/dL (ref 31.5–36.0)
MCV: 88.6 fL (ref 79.5–101.0)
MONOS PCT: 2 %
Monocytes Absolute: 0.3 10*3/uL (ref 0.1–0.9)
NEUTROS ABS: 13.1 10*3/uL — AB (ref 1.5–6.5)
NEUTROS PCT: 86 %
Platelet Count: 98 10*3/uL — ABNORMAL LOW (ref 145–400)
RBC: 4.14 MIL/uL (ref 3.70–5.45)
RDW: 14.3 % (ref 11.2–14.5)
WBC Count: 15.2 10*3/uL — ABNORMAL HIGH (ref 3.9–10.3)

## 2018-01-28 LAB — URINALYSIS, COMPLETE (UACMP) WITH MICROSCOPIC
Bilirubin Urine: NEGATIVE
Glucose, UA: NEGATIVE mg/dL
HGB URINE DIPSTICK: NEGATIVE
Ketones, ur: NEGATIVE mg/dL
LEUKOCYTES UA: NEGATIVE
NITRITE: NEGATIVE
Protein, ur: NEGATIVE mg/dL
SPECIFIC GRAVITY, URINE: 1.008 (ref 1.005–1.030)
pH: 8 (ref 5.0–8.0)

## 2018-01-28 LAB — CMP (CANCER CENTER ONLY)
ALK PHOS: 90 U/L (ref 40–150)
ALT: 15 U/L (ref 0–55)
ANION GAP: 10 (ref 3–11)
AST: 16 U/L (ref 5–34)
Albumin: 3.7 g/dL (ref 3.5–5.0)
BILIRUBIN TOTAL: 0.3 mg/dL (ref 0.2–1.2)
BUN: 10 mg/dL (ref 7–26)
CALCIUM: 10 mg/dL (ref 8.4–10.4)
CO2: 24 mmol/L (ref 22–29)
Chloride: 107 mmol/L (ref 98–109)
Creatinine: 0.9 mg/dL (ref 0.60–1.10)
GLUCOSE: 74 mg/dL (ref 70–140)
Potassium: 3.3 mmol/L — ABNORMAL LOW (ref 3.5–5.1)
Sodium: 141 mmol/L (ref 136–145)
TOTAL PROTEIN: 6.3 g/dL — AB (ref 6.4–8.3)

## 2018-01-28 LAB — PHOSPHORUS: PHOSPHORUS: 2.3 mg/dL — AB (ref 2.5–4.6)

## 2018-01-28 LAB — D-DIMER, QUANTITATIVE: D-Dimer, Quant: 0.45 ug/mL-FEU (ref 0.00–0.50)

## 2018-01-28 LAB — MAGNESIUM: MAGNESIUM: 2.1 mg/dL (ref 1.7–2.4)

## 2018-01-28 MED ORDER — SODIUM CHLORIDE 0.9% FLUSH
10.0000 mL | Freq: Once | INTRAVENOUS | Status: AC
  Administered 2018-01-28: 10 mL via INTRAVENOUS
  Filled 2018-01-28: qty 10

## 2018-01-28 MED ORDER — SODIUM CHLORIDE 0.9 % IV SOLN
1000.0000 mL | Freq: Once | INTRAVENOUS | Status: AC
  Administered 2018-01-28: 1000 mL via INTRAVENOUS

## 2018-01-28 MED ORDER — HEPARIN SOD (PORK) LOCK FLUSH 100 UNIT/ML IV SOLN
500.0000 [IU] | Freq: Once | INTRAVENOUS | Status: AC
  Administered 2018-01-28: 500 [IU] via INTRAVENOUS
  Filled 2018-01-28: qty 5

## 2018-01-28 MED ORDER — POTASSIUM PHOSPHATE MONOBASIC 500 MG PO TABS: 500.0000 mg | ORAL_TABLET | Freq: Every day | ORAL | 0 refills | Status: DC

## 2018-01-28 NOTE — Patient Instructions (Signed)
Dehydration, Adult Dehydration is when there is not enough fluid or water in your body. This happens when you lose more fluids than you take in. Dehydration can range from mild to very bad. It should be treated right away to keep it from getting very bad. Symptoms of mild dehydration may include:  Thirst.  Dry lips.  Slightly dry mouth.  Dry, warm skin.  Dizziness. Symptoms of moderate dehydration may include:  Very dry mouth.  Muscle cramps.  Dark pee (urine). Pee may be the color of tea.  Your body making less pee.  Your eyes making fewer tears.  Heartbeat that is uneven or faster than normal (palpitations).  Headache.  Light-headedness, especially when you stand up from sitting.  Fainting (syncope). Symptoms of very bad dehydration may include:  Changes in skin, such as: ? Cold and clammy skin. ? Blotchy (mottled) or pale skin. ? Skin that does not quickly return to normal after being lightly pinched and let go (poor skin turgor).  Changes in body fluids, such as: ? Feeling very thirsty. ? Your eyes making fewer tears. ? Not sweating when body temperature is high, such as in hot weather. ? Your body making very little pee.  Changes in vital signs, such as: ? Weak pulse. ? Pulse that is more than 100 beats a minute when you are sitting still. ? Fast breathing. ? Low blood pressure.  Other changes, such as: ? Sunken eyes. ? Cold hands and feet. ? Confusion. ? Lack of energy (lethargy). ? Trouble waking up from sleep. ? Short-term weight loss. ? Unconsciousness. Follow these instructions at home:  If told by your doctor, drink an ORS: ? Make an ORS by using instructions on the package. ? Start by drinking small amounts, about  cup (120 mL) every 5-10 minutes. ? Slowly drink more until you have had the amount that your doctor said to have.  Drink enough clear fluid to keep your pee clear or pale yellow. If you were told to drink an ORS, finish the ORS  first, then start slowly drinking clear fluids. Drink fluids such as: ? Water. Do not drink only water by itself. Doing that can make the salt (sodium) level in your body get too low (hyponatremia). ? Ice chips. ? Fruit juice that you have added water to (diluted). ? Low-calorie sports drinks.  Avoid: ? Alcohol. ? Drinks that have a lot of sugar. These include high-calorie sports drinks, fruit juice that does not have water added, and soda. ? Caffeine. ? Foods that are greasy or have a lot of fat or sugar.  Take over-the-counter and prescription medicines only as told by your doctor.  Do not take salt tablets. Doing that can make the salt level in your body get too high (hypernatremia).  Eat foods that have minerals (electrolytes). Examples include bananas, oranges, potatoes, tomatoes, and spinach.  Keep all follow-up visits as told by your doctor. This is important. Contact a doctor if:  You have belly (abdominal) pain that: ? Gets worse. ? Stays in one area (localizes).  You have a rash.  You have a stiff neck.  You get angry or annoyed more easily than normal (irritability).  You are more sleepy than normal.  You have a harder time waking up than normal.  You feel: ? Weak. ? Dizzy. ? Very thirsty.  You have peed (urinated) only a small amount of very dark pee during 6-8 hours. Get help right away if:  You have symptoms of   very bad dehydration.  You cannot drink fluids without throwing up (vomiting).  Your symptoms get worse with treatment.  You have a fever.  You have a very bad headache.  You are throwing up or having watery poop (diarrhea) and it: ? Gets worse. ? Does not go away.  You have blood or something green (bile) in your throw-up.  You have blood in your poop (stool). This may cause poop to look black and tarry.  You have not peed in 6-8 hours.  You pass out (faint).  Your heart rate when you are sitting still is more than 100 beats a  minute.  You have trouble breathing. This information is not intended to replace advice given to you by your health care provider. Make sure you discuss any questions you have with your health care provider. Document Released: 07/29/2009 Document Revised: 04/21/2016 Document Reviewed: 11/26/2015 Elsevier Interactive Patient Education  2018 [employer]  

## 2018-01-28 NOTE — Progress Notes (Signed)
Pt ambulated around unit with pulse ox attached.  HR stayed in mid 80s.  O2 sats dropped from 99% to 85% and pt reported some SOB.  Denied dizziness or weakness.  Ambulatory w/steady gait.  PA Lucianne Lei aware.

## 2018-01-29 LAB — URINE CULTURE: CULTURE: NO GROWTH

## 2018-01-29 NOTE — Progress Notes (Signed)
These results were called to Alicia [name] and were reviewed with her . Her were answered. She expressed understanding.

## 2018-01-29 NOTE — Progress Notes (Signed)
These results were called to [name] and were reviewed with her . Her were answered. She expressed understanding.

## 2018-01-30 NOTE — Progress Notes (Signed)
These preliminary result these preliminary results were noted.  Awaiting final report.

## 2018-01-30 NOTE — Progress Notes (Signed)
[facility] Progress Note   Alicia [name] [id number] 03/01/1941 77 y.o.  [name] is managed by Dr. Sullivan Lone  Actively treated with chemotherapy: yes  Current Therapy: R CHOP    Last Treated: 01/18/2018  Assessment: Plan:    Hypotension, unspecified hypotension type - Plan: 0.9 %  sodium chloride infusion  Dyspnea on exertion - Plan: D-dimer, quantitative, heparin lock flush 100 unit/mL, sodium chloride flush (NS) 0.9 % injection 10 mL  Hypophosphatemia - Plan: potassium phosphate, monobasic, (K-PHOS) 500 MG tablet  Hypokalemia   Hypotension: The patient's blood pressure returned today at 105/76.  She has been having episodes of dizziness.  Her blood pressure was noted to drop to 95/59 on standing today.  She was given 1 L of normal saline IV.  Dyspnea on exertion: The patient was noted to have a drop in her oxygen saturation to 86% with ambulation.  This lasted momentarily with the patient oxygen saturation recovering quickly after resting.  A d-dimer was ordered.  This returned negative.  This case was discussed with Dr. Burr Medico who is in agreement that this could be related to the patient's hypophosphatemia.  The patient's phosphorus level returned at 2.3 today.  4 days ago it was < 1 at the emergency room when it was repleted.  This brought her phosphorus level to 3.7.  Hypophosphatemia and hypokalemia.  Patient was seen in the emergency room on 01/24/2018 with dyspnea.  She was found to have a phosphorus level of less than 1.  Her phosphorus was repleted.  It recovered to 3.7.  Today her phosphorus level is 2.3.  Also noted was a potassium level of 3.3.  The patient was given a prescription for K-Phos 500 mg once daily.  This case was discussed with Dr. Burr Medico.  Please see After Visit Summary for patient specific instructions.  Future Appointments  Date Time Provider [place]  02/08/2018  8:00 AM [facility] LAB 4 [facility] None  02/08/2018   8:30 AM [facility] FLUSH NURSE 2 [facility] None  02/08/2018  9:00 AM Curcio, Roselie Awkward, NP [facility] None  02/08/2018  9:45 AM [facility] E16 [facility] None  02/11/2018  9:00 AM [facility] INJ NURSE [facility] None  03/01/2018  8:00 AM [facility] LAB ONLY [facility] None  03/01/2018  8:15 AM [facility] FLUSH NURSE 2 [facility] None  03/01/2018  8:40 AM Brunetta Genera, MD [facility] None  03/01/2018  9:30 AM [facility] A2 [facility] None  03/04/2018  9:15 AM [facility] INJ NURSE [facility] None    Orders Placed This Encounter  Procedures  . D-dimer, quantitative       Subjective:   Patient ID:  GRISSEL [name] is a 77 y.o. (DOB [birth year]) female.  Chief Complaint: No chief complaint on file.   HPI [name] is a 77 year old female with a history of a grade 3A follicular lymphoma who is managed by Dr. Sullivan Lone and has recently received cycle 2 of R CHOP on 01/18/2018 followed by Neulasta on 01/21/2018.  She was most recently since admitted overnight for observation on 01/24/2018 when she reported to [facility] with dyspnea.  Her labs returned showing that her phosphorus level was less than 1.  It was believed that her dyspnea was likely related to her hypophosphatemia.  She was repleted with phosphorus while in the emergency room.  Her repeat phosphorus level returned at 3.7.  She presents to the office today with a recurrence of the symptoms.  She is noted increasing  shortness of breath, blood pressure that it dropped to 95/59, increased urination, she is also had episodic constipation which she is treated with stool softeners which caused her to subsequently developed diarrhea.  She had oral tenderness and a sore throat but has had none since Saturday.  She also reports having some episodic stinging at the site of her left chest wall ports and noted some stinging in her epigastrium where her port was accessed today.  This has resolved.  Medications: I have reviewed  the patient's current medications.  Allergies: No Known Allergies  Past Medical History:  Diagnosis Date  . Arthritis    back and hips  . GERD (gastroesophageal reflux disease)    TUMS  . Hypercholesterolemia   . Hypertension   . Non Hodgkin's lymphoma [facility])     Past Surgical History:  Procedure Laterality Date  . LYMPH NODE BIOPSY Left 11/27/2017   Procedure: LEFT INGUINAL LYMPH NODE BIOPSY;  Surgeon: Stark Klein, MD;  Location: [place];  Service: General;  Laterality: Left;  . PORTACATH PLACEMENT Left 12/12/2017   Procedure: INSERTION PORT-A-CATH;  Surgeon: Stark Klein, MD;  Location: [place];  Service: General;  Laterality: Left;  . TONSILLECTOMY      No family history on file.  Social History   Socioeconomic History  . Marital status: Widowed    Spouse name: Not on file  . Number of children: Not on file  . Years of education: Not on file  . Highest education level: Not on file  Occupational History  . Not on file  Social Needs  . Financial resource strain: Not on file  . Food insecurity:    Worry: Not on file    Inability: Not on file  . Transportation needs:    Medical: Not on file    Non-medical: Not on file  Tobacco Use  . Smoking status: Never Smoker  . Smokeless tobacco: Never Used  Substance and Sexual Activity  . Alcohol use: No  . Drug use: No  . Sexual activity: Not on file  Lifestyle  . Physical activity:    Days per week: Not on file    Minutes per session: Not on file  . Stress: Not on file  Relationships  . Social connections:    Talks on phone: Not on file    Gets together: Not on file    Attends religious service: Not on file    Active member of club or organization: Not on file    Attends meetings of clubs or organizations: Not on file    Relationship status: Not on file  . Intimate partner violence:    Fear of current or ex partner: Not on file    Emotionally abused: Not on file    Physically abused: Not on  file    Forced sexual activity: Not on file  Other Topics Concern  . Not on file  Social History Narrative  . Not on file    Past Medical History, Surgical history, Social history, and Family history were reviewed and updated as appropriate.   Please see review of systems for further details on the patient's review from today.   Review of Systems:  Review of Systems  Constitutional: Positive for fatigue. Negative for chills, diaphoresis and fever.  HENT: Negative for mouth sores, rhinorrhea and sore throat.   Respiratory: Positive for shortness of breath. Negative for cough, chest tightness and wheezing.   Cardiovascular: Negative for chest pain, palpitations and leg swelling.  Gastrointestinal: Positive for constipation and diarrhea. Negative for nausea and vomiting.    Objective:   Physical Exam:  BP 105/76 (BP Location: Left Arm, Patient Position: Sitting)   Pulse 76   Temp 97.7 F (36.5 C) (Oral)   Resp 18   Ht 5\' 6"  (1.676 m)   Wt 130 lb 4.8 oz (59.1 kg)   SpO2 100%   BMI 21.03 kg/m  ECOG: 1  Physical Exam  Constitutional: No distress.  HENT:  Head: Normocephalic and atraumatic.  Right Ear: External ear normal.  Left Ear: External ear normal.  Mouth/Throat: Oropharynx is clear and moist. No oropharyngeal exudate.  Neck: Normal range of motion. Neck supple.  Cardiovascular: Normal rate, regular rhythm and normal heart sounds. Exam reveals no gallop and no friction rub.  No murmur heard. An accessed left chest wall Port-A-Cath is noted.  No erythema, increased warmth, or exudate.  Pulmonary/Chest: Effort normal and breath sounds normal. No respiratory distress. She has no wheezes. She has no rales.  Lymphadenopathy:    She has no cervical adenopathy.  Neurological: She is alert. Coordination normal.  Skin: Skin is warm and dry. No rash noted. She is not diaphoretic. No erythema.  Psychiatric: She has a normal mood and affect. Her behavior is normal. Judgment and  thought content normal.    Lab Review:     Component Value Date/Time   NA 141 01/28/2018 1234   K 3.3 (L) 01/28/2018 1234   CL 107 01/28/2018 1234   CO2 24 01/28/2018 1234   GLUCOSE 74 01/28/2018 1234   BUN 10 01/28/2018 1234   CREATININE 0.90 01/28/2018 1234   CALCIUM 10.0 01/28/2018 1234   PROT 6.3 (L) 01/28/2018 1234   ALBUMIN 3.7 01/28/2018 1234   AST 16 01/28/2018 1234   ALT 15 01/28/2018 1234   ALKPHOS 90 01/28/2018 1234   BILITOT 0.3 01/28/2018 1234   GFRNONAA >60 01/28/2018 1234   GFRAA >60 01/28/2018 1234       Component Value Date/Time   WBC 15.2 (H) 01/28/2018 1234   WBC 1.1 (LL) 01/25/2018 1214   RBC 4.14 01/28/2018 1234   HGB 11.0 (L) 01/25/2018 1214   HCT 36.6 01/28/2018 1234   PLT 98 (L) 01/28/2018 1234   MCV 88.6 01/28/2018 1234   MCH 28.9 01/28/2018 1234   MCHC 32.6 01/28/2018 1234   RDW 14.3 01/28/2018 1234   LYMPHSABS 1.3 01/28/2018 1234   MONOABS 0.3 01/28/2018 1234   EOSABS 0.1 01/28/2018 1234   BASOSABS 0.3 (H) 01/28/2018 1234   -------------------------------  Imaging from last 24 hours (if applicable):  Radiology interpretation: Dg Chest 2 View  Result Date: 01/24/2018 CLINICAL DATA:  Sudden onset shortness of breath EXAM: CHEST - 2 VIEW COMPARISON:  12/12/2017 FINDINGS: Normal heart size. Stable aortic tortuosity. There is no edema, consolidation, effusion, or pneumothorax. Porta catheter on the left with tip at the SVC origin. IMPRESSION: No evidence of active disease. Electronically Signed   By: Monte Fantasia M.D.   On: 01/24/2018 09:11   Ct Angio Chest Pe W Or Wo Contrast  Result Date: 01/24/2018 CLINICAL DATA:  Hypertension and shortness of breath. History of non-Hodgkin's lymphoma. EXAM: CT ANGIOGRAPHY CHEST WITH CONTRAST TECHNIQUE: Multidetector CT imaging of the chest was performed using the standard protocol during bolus administration of intravenous contrast. Multiplanar CT image reconstructions and MIPs were obtained to evaluate  the vascular anatomy. CONTRAST:  169mL ISOVUE-370 IOPAMIDOL (ISOVUE-370) INJECTION 76% COMPARISON:  PET-CT 11/19/2017 FINDINGS: Cardiovascular: The heart is borderline  enlarged but stable. No pericardial effusion. Stable tortuosity and fusiform ectasia of the descending thoracic aorta. No dissection. Minimal atherosclerotic calcifications. Normal caliber ascending aorta. No definite coronary artery calcifications. The pulmonary arterial tree is fairly well opacified. No filling defects to suggest pulmonary embolism. Mediastinum/Nodes: No mediastinal or hilar lymphadenopathy. The esophagus is grossly normal. Lungs/Pleura: Streaky bibasilar atelectasis. No infiltrates or effusions. No worrisome pulmonary lesions. Upper Abdomen: No significant upper abdominal findings. Musculoskeletal: No significant bony findings Review of the MIP images confirms the above findings. IMPRESSION: 1. No CT findings for pulmonary embolism. 2. Tortuosity and ectasia of the thoracic aorta but no focal aneurysm or dissection. 3. No significant pulmonary findings. Streaky basilar atelectasis. No worrisome pulmonary lesions. 4. No mediastinal or hilar mass or lymphadenopathy. Aortic Atherosclerosis (ICD10-I70.0). Electronically Signed   By: Marijo Sanes M.D.   On: 01/24/2018 19:56        This case was discussed with Dr. Burr Medico. She expressed agreement with my management of this patient.

## 2018-01-31 NOTE — Progress Notes (Signed)
These preliminary result these preliminary results were noted.  Awaiting final report.

## 2018-02-01 NOTE — Progress Notes (Signed)
These preliminary result these preliminary results were noted.  Awaiting final report.

## 2018-02-02 LAB — CULTURE, BLOOD (SINGLE)
CULTURE: NO GROWTH
Culture: NO GROWTH

## 2018-02-06 ENCOUNTER — Other Ambulatory Visit: Payer: Self-pay | Admitting: Oncology

## 2018-02-06 DIAGNOSIS — C8238 Follicular lymphoma grade IIIa, lymph nodes of multiple sites: Secondary | ICD-10-CM

## 2018-02-08 ENCOUNTER — Encounter: Payer: Self-pay | Admitting: Oncology

## 2018-02-08 ENCOUNTER — Inpatient Hospital Stay: Payer: Medicare HMO

## 2018-02-08 ENCOUNTER — Inpatient Hospital Stay (HOSPITAL_BASED_OUTPATIENT_CLINIC_OR_DEPARTMENT_OTHER): Payer: Medicare HMO | Admitting: Oncology

## 2018-02-08 ENCOUNTER — Other Ambulatory Visit: Payer: Medicare HMO

## 2018-02-08 VITALS — BP 105/58 | HR 71 | Temp 98.0°F | Resp 18

## 2018-02-08 VITALS — BP 130/64 | HR 66 | Temp 98.0°F | Resp 18 | Ht 66.0 in | Wt 133.1 lb

## 2018-02-08 DIAGNOSIS — C8285 Other types of follicular lymphoma, lymph nodes of inguinal region and lower limb: Secondary | ICD-10-CM

## 2018-02-08 DIAGNOSIS — C8238 Follicular lymphoma grade IIIa, lymph nodes of multiple sites: Secondary | ICD-10-CM

## 2018-02-08 DIAGNOSIS — I10 Essential (primary) hypertension: Secondary | ICD-10-CM | POA: Diagnosis not present

## 2018-02-08 DIAGNOSIS — Z5112 Encounter for antineoplastic immunotherapy: Secondary | ICD-10-CM | POA: Diagnosis not present

## 2018-02-08 DIAGNOSIS — F419 Anxiety disorder, unspecified: Secondary | ICD-10-CM

## 2018-02-08 DIAGNOSIS — R69 Illness, unspecified: Secondary | ICD-10-CM | POA: Diagnosis not present

## 2018-02-08 DIAGNOSIS — Z5111 Encounter for antineoplastic chemotherapy: Secondary | ICD-10-CM | POA: Insufficient documentation

## 2018-02-08 DIAGNOSIS — G479 Sleep disorder, unspecified: Secondary | ICD-10-CM | POA: Diagnosis not present

## 2018-02-08 DIAGNOSIS — Z7189 Other specified counseling: Secondary | ICD-10-CM

## 2018-02-08 LAB — CBC WITH DIFFERENTIAL (CANCER CENTER ONLY)
BASOS ABS: 0.1 10*3/uL (ref 0.0–0.1)
Basophils Relative: 1 %
Eosinophils Absolute: 0 10*3/uL (ref 0.0–0.5)
Eosinophils Relative: 0 %
HCT: 35.9 % (ref 34.8–46.6)
HEMOGLOBIN: 11.9 g/dL (ref 11.6–15.9)
LYMPHS PCT: 7 %
Lymphs Abs: 0.7 10*3/uL — ABNORMAL LOW (ref 0.9–3.3)
MCH: 30.1 pg (ref 25.1–34.0)
MCHC: 33.2 g/dL (ref 31.5–36.0)
MCV: 90.8 fL (ref 79.5–101.0)
Monocytes Absolute: 0.6 10*3/uL (ref 0.1–0.9)
Monocytes Relative: 6 %
NEUTROS PCT: 86 %
Neutro Abs: 8.3 10*3/uL — ABNORMAL HIGH (ref 1.5–6.5)
Platelet Count: 272 10*3/uL (ref 145–400)
RBC: 3.95 MIL/uL (ref 3.70–5.45)
RDW: 17 % — ABNORMAL HIGH (ref 11.2–14.5)
WBC: 9.7 10*3/uL (ref 3.9–10.3)

## 2018-02-08 LAB — COMPREHENSIVE METABOLIC PANEL
ALK PHOS: 64 U/L (ref 38–126)
ALT: 16 U/L (ref 14–54)
AST: 23 U/L (ref 15–41)
Albumin: 3.5 g/dL (ref 3.5–5.0)
Anion gap: 11 (ref 5–15)
BUN: 15 mg/dL (ref 6–20)
CALCIUM: 9.1 mg/dL (ref 8.9–10.3)
CHLORIDE: 108 mmol/L (ref 101–111)
CO2: 25 mmol/L (ref 22–32)
CREATININE: 1.11 mg/dL — AB (ref 0.44–1.00)
GFR calc non Af Amer: 47 mL/min — ABNORMAL LOW (ref >60)
GFR, EST AFRICAN AMERICAN: 54 mL/min — AB (ref >60)
Glucose, Bld: 116 mg/dL — ABNORMAL HIGH (ref 65–99)
Potassium: 3.9 mmol/L (ref 3.5–5.1)
SODIUM: 144 mmol/L (ref 135–145)
Total Bilirubin: 0.7 mg/dL (ref 0.3–1.2)
Total Protein: 6 g/dL — ABNORMAL LOW (ref 6.5–8.1)

## 2018-02-08 LAB — MAGNESIUM: Magnesium: 2.1 mg/dL (ref 1.7–2.4)

## 2018-02-08 LAB — PHOSPHORUS: Phosphorus: 3.9 mg/dL (ref 2.5–4.6)

## 2018-02-08 LAB — LACTATE DEHYDROGENASE: LDH: 206 U/L (ref 125–245)

## 2018-02-08 MED ORDER — SODIUM CHLORIDE 0.9% FLUSH
10.0000 mL | INTRAVENOUS | Status: DC | PRN
  Administered 2018-02-08: 10 mL
  Filled 2018-02-08: qty 10

## 2018-02-08 MED ORDER — SODIUM CHLORIDE 0.9 % IV SOLN
750.0000 mg/m2 | Freq: Once | INTRAVENOUS | Status: AC
  Administered 2018-02-08: 1260 mg via INTRAVENOUS
  Filled 2018-02-08: qty 63

## 2018-02-08 MED ORDER — SODIUM CHLORIDE 0.9 % IV SOLN
Freq: Once | INTRAVENOUS | Status: AC
  Administered 2018-02-08: 10:00:00 via INTRAVENOUS

## 2018-02-08 MED ORDER — VINCRISTINE SULFATE CHEMO INJECTION 1 MG/ML
2.0000 mg | Freq: Once | INTRAVENOUS | Status: AC
  Administered 2018-02-08: 2 mg via INTRAVENOUS
  Filled 2018-02-08: qty 2

## 2018-02-08 MED ORDER — ACETAMINOPHEN 325 MG PO TABS
650.0000 mg | ORAL_TABLET | Freq: Once | ORAL | Status: AC
  Administered 2018-02-08: 650 mg via ORAL

## 2018-02-08 MED ORDER — DEXAMETHASONE SODIUM PHOSPHATE 10 MG/ML IJ SOLN
10.0000 mg | Freq: Once | INTRAMUSCULAR | Status: AC
  Administered 2018-02-08: 10 mg via INTRAVENOUS

## 2018-02-08 MED ORDER — DIPHENHYDRAMINE HCL 25 MG PO CAPS
ORAL_CAPSULE | ORAL | Status: AC
  Filled 2018-02-08: qty 2

## 2018-02-08 MED ORDER — DOXORUBICIN HCL CHEMO IV INJECTION 2 MG/ML
50.0000 mg/m2 | Freq: Once | INTRAVENOUS | Status: AC
  Administered 2018-02-08: 84 mg via INTRAVENOUS
  Filled 2018-02-08: qty 42

## 2018-02-08 MED ORDER — LORAZEPAM 0.5 MG PO TABS: 0.5000 mg | ORAL_TABLET | Freq: Four times a day (QID) | ORAL | 0 refills | Status: DC | PRN

## 2018-02-08 MED ORDER — SODIUM CHLORIDE 0.9 % IV SOLN
375.0000 mg/m2 | Freq: Once | INTRAVENOUS | Status: AC
  Administered 2018-02-08: 600 mg via INTRAVENOUS
  Filled 2018-02-08: qty 10

## 2018-02-08 MED ORDER — DIPHENHYDRAMINE HCL 25 MG PO CAPS
50.0000 mg | ORAL_CAPSULE | Freq: Once | ORAL | Status: AC
  Administered 2018-02-08: 50 mg via ORAL

## 2018-02-08 MED ORDER — HEPARIN SOD (PORK) LOCK FLUSH 100 UNIT/ML IV SOLN
500.0000 [IU] | Freq: Once | INTRAVENOUS | Status: AC | PRN
  Administered 2018-02-08: 500 [IU]
  Filled 2018-02-08: qty 5

## 2018-02-08 MED ORDER — ACETAMINOPHEN 325 MG PO TABS
ORAL_TABLET | ORAL | Status: AC
  Filled 2018-02-08: qty 2

## 2018-02-08 MED ORDER — DEXAMETHASONE SODIUM PHOSPHATE 10 MG/ML IJ SOLN
INTRAMUSCULAR | Status: AC
Start: 2018-02-08 — End: 2018-02-08
  Filled 2018-02-08: qty 1

## 2018-02-08 MED ORDER — PALONOSETRON HCL INJECTION 0.25 MG/5ML
INTRAVENOUS | Status: AC
  Filled 2018-02-08: qty 5

## 2018-02-08 MED ORDER — SODIUM CHLORIDE 0.9 % IJ SOLN
10.0000 mL | Freq: Once | INTRAMUSCULAR | Status: AC
  Administered 2018-02-08: 10 mL via INTRAVENOUS
  Filled 2018-02-08: qty 10

## 2018-02-08 MED ORDER — PALONOSETRON HCL INJECTION 0.25 MG/5ML
0.2500 mg | Freq: Once | INTRAVENOUS | Status: AC
  Administered 2018-02-08: 0.25 mg via INTRAVENOUS

## 2018-02-08 NOTE — Patient Instructions (Signed)

## 2018-02-08 NOTE — Patient Instructions (Signed)
[facility] Discharge Instructions for Patients Receiving Chemotherapy  Today you received the following chemotherapy agents Adriamycin, Vincristine, Cytoxan, Rituxan.  To help prevent nausea and vomiting after your treatment, we encourage you to take your nausea medication as directed.  If you develop nausea and vomiting that is not controlled by your nausea medication, call the clinic.   BELOW ARE SYMPTOMS THAT SHOULD BE REPORTED IMMEDIATELY:  *FEVER GREATER THAN 100.5 F  *CHILLS WITH OR WITHOUT FEVER  NAUSEA AND VOMITING THAT IS NOT CONTROLLED WITH YOUR NAUSEA MEDICATION  *UNUSUAL SHORTNESS OF BREATH  *UNUSUAL BRUISING OR BLEEDING  TENDERNESS IN MOUTH AND THROAT WITH OR WITHOUT PRESENCE OF ULCERS  *URINARY PROBLEMS  *BOWEL PROBLEMS  UNUSUAL RASH Items with * indicate a potential emergency and should be followed up as soon as possible.  Feel free to call the clinic should you have any questions or concerns. The clinic phone number is [phone].  Please show the CHEMO ALERT CARD at check-in to the Emergency Department and triage nurse.   

## 2018-02-08 NOTE — Progress Notes (Signed)
[facility] OFFICE PROGRESS NOTE  Celene Squibb, MD [place] [address]  DIAGNOSIS: Stage IIIA Follicular lymphoma  PRIOR THERAPY: None  CURRENT THERAPY: R-CHOP status post 2 cycles.  First cycle given on 12/28/2017.  INTERVAL HISTORY: Alicia [name] 77 y.o. female returns for routine follow-up visit.  Since her last visit, she was hospitalized with dyspnea on exertion thought to be related to a low phosphorus level.  Phosphorus was replaced.  She remains on K-Phos.  She was discharged home with improvement in her breathing.  She feels well today.  Denies fevers and chills.  Denies chest pain, shortness breath, cough, hemoptysis.  Denies nausea, vomiting, constipation, diarrhea.  Denies recent weight loss or night sweats.  The patient is here for evaluation prior to cycle 3 of her chemotherapy.  MEDICAL HISTORY: Past Medical History:  Diagnosis Date  . Arthritis    back and hips  . GERD (gastroesophageal reflux disease)    TUMS  . Hypercholesterolemia   . Hypertension   . Non Hodgkin's lymphoma ([place])     ALLERGIES:  has No Known Allergies.  MEDICATIONS:  Current Outpatient Medications  Medication Sig Dispense Refill  . acetaminophen (TYLENOL) 500 MG tablet Take 500 mg by mouth at bedtime.    . bisoprolol-hydrochlorothiazide (ZIAC) 5-6.25 MG tablet Take 1 tablet by mouth 2 (two) times daily.    Marland Kitchen ezetimibe-simvastatin (VYTORIN) 10-10 MG tablet Take 1 tablet by mouth at bedtime.    . lidocaine-prilocaine (EMLA) cream Apply to affected area once 30 g 3  . LORazepam (ATIVAN) 0.5 MG tablet Take 1 tablet (0.5 mg total) by mouth every 6 (six) hours as needed (Nausea or vomiting). 30 tablet 0  . magic mouthwash w/lidocaine SOLN Take 5 mLs by mouth 4 (four) times daily as needed for mouth pain. 1 part maalox, 1 part nystain, 1 part viscous lidocaine 200 mL 0  . Melatonin 10 MG TABS Take 10 mg by mouth daily.    . naproxen sodium (ALEVE) 220 MG tablet  Take 220 mg by mouth daily as needed (pain).    Marland Kitchen omega-3 acid ethyl esters (LOVAZA) 1 g capsule Take 1 g by mouth daily.     . ondansetron (ZOFRAN) 8 MG tablet Take 1 tablet (8 mg total) by mouth 2 (two) times daily as needed for refractory nausea / vomiting. From day 3 after chemotherapy. 30 tablet 1  . potassium phosphate, monobasic, (K-PHOS) 500 MG tablet Take 1 tablet (500 mg total) by mouth daily. 30 tablet 0  . predniSONE (DELTASONE) 20 MG tablet Take 3 tablets (60 mg total) by mouth daily. Take on days 1-5 of chemotherapy. 15 tablet 5  . prochlorperazine (COMPAZINE) 10 MG tablet Take 1 tablet (10 mg total) by mouth every 6 (six) hours as needed (Nausea or vomiting). 30 tablet 6  . senna-docusate (SENNA S) 8.6-50 MG tablet Take 2 tablets by mouth 2 (two) times daily. May reduce to 2 tab po HS once bowel movements established. 60 tablet 2   No current facility-administered medications for this visit.    Facility-Administered Medications Ordered in Other Visits  Medication Dose Route Frequency Provider Last Rate Last Dose  . heparin lock flush 100 unit/mL  500 Units Intracatheter Once PRN Brunetta Genera, MD      . sodium chloride flush (NS) 0.9 % injection 10 mL  10 mL Intracatheter PRN Brunetta Genera, MD        SURGICAL HISTORY:  Past Surgical History:  Procedure Laterality Date  . LYMPH NODE BIOPSY Left 11/27/2017   Procedure: LEFT INGUINAL LYMPH NODE BIOPSY;  Surgeon: Stark Klein, MD;  Location: [facility];  Service: General;  Laterality: Left;  . PORTACATH PLACEMENT Left 12/12/2017   Procedure: INSERTION PORT-A-CATH;  Surgeon: Stark Klein, MD;  Location: [facility];  Service: General;  Laterality: Left;  . TONSILLECTOMY      REVIEW OF SYSTEMS:   Review of Systems  Constitutional: Negative for appetite change, chills, fatigue, fever and unexpected weight change.  HENT:   Negative for mouth sores, nosebleeds, sore throat and trouble swallowing.   Eyes:  Negative for eye problems and icterus.  Respiratory: Negative for cough, hemoptysis, shortness of breath and wheezing.   Cardiovascular: Negative for chest pain and leg swelling.  Gastrointestinal: Negative for abdominal pain, constipation, diarrhea, nausea and vomiting.  Genitourinary: Negative for bladder incontinence, difficulty urinating, dysuria, frequency and hematuria.   Musculoskeletal: Negative for back pain, gait problem, neck pain and neck stiffness.  Skin: Negative for itching and rash.  Neurological: Negative for dizziness, extremity weakness, gait problem, headaches, light-headedness and seizures.  Hematological: Negative for adenopathy. Does not bruise/bleed easily.  Psychiatric/Behavioral: Negative for confusion, depression and sleep disturbance. The patient is not nervous/anxious.     PHYSICAL EXAMINATION:  Blood pressure 130/64, pulse 66, temperature 98 F (36.7 C), temperature source Oral, resp. rate 18, height 5\' 6"  (1.676 m), weight 133 lb 1.6 oz (60.4 kg), SpO2 98 %.  ECOG PERFORMANCE STATUS: 1 - Symptomatic but completely ambulatory  Physical Exam  Constitutional: Oriented to person, place, and time and well-developed, well-nourished, and in no distress. No distress.  HENT:  Head: Normocephalic and atraumatic.  Mouth/Throat: Oropharynx is clear and moist. No oropharyngeal exudate.  Eyes: Conjunctivae are normal. Right eye exhibits no discharge. Left eye exhibits no discharge. No scleral icterus.  Neck: Normal range of motion. Neck supple.  Cardiovascular: Normal rate, regular rhythm, normal heart sounds and intact distal pulses.   Pulmonary/Chest: Effort normal and breath sounds normal. No respiratory distress. No wheezes. No rales.  Abdominal: Soft. Bowel sounds are normal. Exhibits no distension and no mass. There is no tenderness.  Musculoskeletal: Normal range of motion. Exhibits no edema.  Lymphadenopathy:    No cervical adenopathy.  Neurological: Alert and  oriented to person, place, and time. Exhibits normal muscle tone. Gait normal. Coordination normal.  Skin: Skin is warm and dry. No rash noted. Not diaphoretic. No erythema. No pallor.  Psychiatric: Mood, memory and judgment normal.  Vitals reviewed.  LABORATORY DATA: Lab Results  Component Value Date   WBC 9.7 02/08/2018   HGB 11.9 02/08/2018   HCT 35.9 02/08/2018   MCV 90.8 02/08/2018   PLT 272 02/08/2018      Chemistry      Component Value Date/Time   NA 144 02/08/2018 0820   K 3.9 02/08/2018 0820   CL 108 02/08/2018 0820   CO2 25 02/08/2018 0820   BUN 15 02/08/2018 0820   CREATININE 1.11 (H) 02/08/2018 0820   CREATININE 0.90 01/28/2018 1234      Component Value Date/Time   CALCIUM 9.1 02/08/2018 0820   ALKPHOS 64 02/08/2018 0820   AST 23 02/08/2018 0820   AST 16 01/28/2018 1234   ALT 16 02/08/2018 0820   ALT 15 01/28/2018 1234   BILITOT 0.7 02/08/2018 0820   BILITOT 0.3 01/28/2018 1234       RADIOGRAPHIC STUDIES:  Dg Chest 2 View  Result  Date: 01/24/2018 CLINICAL DATA:  Sudden onset shortness of breath EXAM: CHEST - 2 VIEW COMPARISON:  12/12/2017 FINDINGS: Normal heart size. Stable aortic tortuosity. There is no edema, consolidation, effusion, or pneumothorax. Porta catheter on the left with tip at the SVC origin. IMPRESSION: No evidence of active disease. Electronically Signed   By: Monte Fantasia M.D.   On: 01/24/2018 09:11   Ct Angio Chest Pe W Or Wo Contrast  Result Date: 01/24/2018 CLINICAL DATA:  Hypertension and shortness of breath. History of non-Hodgkin's lymphoma. EXAM: CT ANGIOGRAPHY CHEST WITH CONTRAST TECHNIQUE: Multidetector CT imaging of the chest was performed using the standard protocol during bolus administration of intravenous contrast. Multiplanar CT image reconstructions and MIPs were obtained to evaluate the vascular anatomy. CONTRAST:  126mL ISOVUE-370 IOPAMIDOL (ISOVUE-370) INJECTION 76% COMPARISON:  PET-CT 11/19/2017 FINDINGS:  Cardiovascular: The heart is borderline enlarged but stable. No pericardial effusion. Stable tortuosity and fusiform ectasia of the descending thoracic aorta. No dissection. Minimal atherosclerotic calcifications. Normal caliber ascending aorta. No definite coronary artery calcifications. The pulmonary arterial tree is fairly well opacified. No filling defects to suggest pulmonary embolism. Mediastinum/Nodes: No mediastinal or hilar lymphadenopathy. The esophagus is grossly normal. Lungs/Pleura: Streaky bibasilar atelectasis. No infiltrates or effusions. No worrisome pulmonary lesions. Upper Abdomen: No significant upper abdominal findings. Musculoskeletal: No significant bony findings Review of the MIP images confirms the above findings. IMPRESSION: 1. No CT findings for pulmonary embolism. 2. Tortuosity and ectasia of the thoracic aorta but no focal aneurysm or dissection. 3. No significant pulmonary findings. Streaky basilar atelectasis. No worrisome pulmonary lesions. 4. No mediastinal or hilar mass or lymphadenopathy. Aortic Atherosclerosis (ICD10-I70.0). Electronically Signed   By: Marijo Sanes M.D.   On: 01/24/2018 19:56     ASSESSMENT/PLAN:  77 y.o. is a female with prior hx of HTN and high cholesterol    1. Recently Diagnosed High grade Follicular lymphoma 2. Significant intra-abdominal generalized lymphadenopathy from high grade FL  11/08/17 and 11/27/17 LN Biopsy confirms Non-Hodgkin's B Cell Lymphoma -follicular lymphoma High Grade (grade 3) HIV non reactive No symptoms suggest an intra-abd /pelvic inflammatory process at this time.  Baseline LDH levels normal at 200 (11/01/17)  Port placed on 12/12/17 [facility] with nl EF  Component     Latest Ref Rng & Units 11/01/2017  Hep B Core Ab, Tot     Negative Negative  Hepatitis B Surface Ag     Negative Negative  HCV Ab     0.0 - 0.9 s/co ratio <0.1  HIV Screen 4th Generation wRfx     Non Reactive Non Reactive   PET scan from 11/19/17 -  with enlarged active lymph nodes primarily in her abdomen and small lymph nodes in her chest and neck, evidence of stage IIIA disease.    PLAN -Labs today are stable. -Proceed with cycle 3 of R-CHOP today as scheduled. -The patient inquired about a follow-up scan following the cycle of treatment.  I will review with Dr. Irene Limbo upon his return next week if the scan is needed at this time.  3. HTN -She is on HTN medication.  Blood pressure remains stable. PLAN  -Given her previous drop of BP I advised her to continue to closely monitor her BP at home and for her to contact her PCP  4. Interrupted Sleep Cycle  -She has tried melatonin and Tylenol PM -Recommend using Ativan for help sleeping and to help her anxiety.  -Refill provided today.  5. Constipation- resolved. likely from Vincristine. -discussed will  need to be proactive with use of laxatives. -Improved with use of stool softeners.  6.  Hypophosphatemia -Phosphorus level today is normal at 3.9. -Continue K-Phos.  RTC with Dr Irene Limbo in 3 weeks with labs and cycle 4 of her treatment  All of the patients questions were answered with apparent satisfaction. The patient knows to call the clinic with any problems, questions or concerns.  Orders Placed This Encounter  Procedures  . Comprehensive metabolic panel   Mikey Bussing, DNP, AGPCNP-BC, AOCNP 02/08/18

## 2018-02-09 ENCOUNTER — Ambulatory Visit: Payer: Medicare HMO

## 2018-02-11 ENCOUNTER — Ambulatory Visit: Payer: Medicare HMO

## 2018-02-11 ENCOUNTER — Inpatient Hospital Stay (HOSPITAL_COMMUNITY): Payer: Medicare HMO

## 2018-02-11 ENCOUNTER — Encounter (HOSPITAL_COMMUNITY): Payer: Self-pay

## 2018-02-11 VITALS — BP 137/75 | HR 65 | Temp 97.6°F | Resp 18

## 2018-02-11 DIAGNOSIS — C8238 Follicular lymphoma grade IIIa, lymph nodes of multiple sites: Secondary | ICD-10-CM

## 2018-02-11 DIAGNOSIS — Z7189 Other specified counseling: Secondary | ICD-10-CM

## 2018-02-11 DIAGNOSIS — C8285 Other types of follicular lymphoma, lymph nodes of inguinal region and lower limb: Secondary | ICD-10-CM | POA: Diagnosis not present

## 2018-02-11 MED ORDER — PEGFILGRASTIM INJECTION 6 MG/0.6ML ~~LOC~~
6.0000 mg | PREFILLED_SYRINGE | Freq: Once | SUBCUTANEOUS | Status: AC
  Administered 2018-02-11: 6 mg via SUBCUTANEOUS
  Filled 2018-02-11: qty 0.6

## 2018-02-11 NOTE — Progress Notes (Signed)
Alicia [name] tolerated Neulasta injection well without complaints or incident. VSS Pt discharged self ambulatory in satisfactory condition

## 2018-02-11 NOTE — Patient Instructions (Signed)
[facility] at [facility] Discharge Instructions  Received Neulasta injection today. Follow-up as scheduled. Call clinic for questions or concerns   Thank you for choosing [facility] to provide your oncology and hematology care.  To afford each patient quality time with our provider, please arrive at least 15 minutes before your scheduled appointment time.   If you have a lab appointment with the [facility] please come in thru the  Main Entrance and check in at the main information desk  You need to re-schedule your appointment should you arrive 10 or more minutes late.  We strive to give you quality time with our providers, and arriving late affects you and other patients whose appointments are after yours.  Also, if you no show three or more times for appointments you may be dismissed from the clinic at the providers discretion.     Again, thank you for choosing [facility].  Our hope is that these requests will decrease the amount of time that you wait before being seen by our physicians.       _____________________________________________________________  Should you have questions after your visit to [facility], please contact our office at [phone] between the hours of 8:30 a.m. and 4:30 p.m.  Voicemails left after 4:30 p.m. will not be returned until the following business day.  For prescription refill requests, have your pharmacy contact our office.       Resources For Cancer Patients and their Caregivers ? American Cancer Society: Can assist with transportation, wigs, general needs, runs Look Good Feel Better.        312 176 5850 ? Cancer Care: Provides financial assistance, online support groups, medication/co-pay assistance.  1-800-813-HOPE 747-020-6529) ? [place] Assists [place] cancer patients and their families through emotional , educational and  financial support.  (858)683-6978 ? [place] DSS Where to apply for food stamps, Medicaid and utility assistance. 620-552-5815 ? [employer]: Transportation to medical appointments. 458-300-5672 ? Social Security Administration: May apply for disability if have a Stage IV cancer. 515-497-4809 579-433-8389 ? [employer], Disability and Transit Services: Assists with nutrition, care and transit needs. [place] Support Programs:   > Cancer Support Group  2nd Tuesday of the month 1pm-2pm, Journey Room   > Creative Journey  3rd Tuesday of the month 1130am-1pm, Journey Room

## 2018-02-12 ENCOUNTER — Telehealth: Payer: Self-pay

## 2018-02-12 ENCOUNTER — Other Ambulatory Visit: Payer: Self-pay | Admitting: Hematology

## 2018-02-12 DIAGNOSIS — C8238 Follicular lymphoma grade IIIa, lymph nodes of multiple sites: Secondary | ICD-10-CM

## 2018-02-12 NOTE — Telephone Encounter (Signed)
During recent visit with Alicia Bussing, NP, pt questioned need for PET scan post cycle 3. Dr. Irene Limbo confirmed appropriate based on plan as discussed with pt. Order to be placed.

## 2018-02-12 NOTE — Progress Notes (Signed)
These preliminary result these preliminary results were noted.  Awaiting final report.

## 2018-02-14 ENCOUNTER — Emergency Department (HOSPITAL_COMMUNITY)
Admission: EM | Admit: 2018-02-14 | Discharge: 2018-02-14 | Disposition: A | Discharge status: Home / Self Care | Payer: Medicare HMO | Attending: Emergency Medicine | Admitting: Emergency Medicine

## 2018-02-14 ENCOUNTER — Emergency Department (HOSPITAL_COMMUNITY): Payer: Medicare HMO

## 2018-02-14 ENCOUNTER — Other Ambulatory Visit: Payer: Self-pay

## 2018-02-14 ENCOUNTER — Encounter (HOSPITAL_COMMUNITY): Payer: Self-pay | Admitting: Emergency Medicine

## 2018-02-14 DIAGNOSIS — Z8572 Personal history of non-Hodgkin lymphomas: Secondary | ICD-10-CM | POA: Insufficient documentation

## 2018-02-14 DIAGNOSIS — E876 Hypokalemia: Secondary | ICD-10-CM

## 2018-02-14 DIAGNOSIS — Z79899 Other long term (current) drug therapy: Secondary | ICD-10-CM | POA: Insufficient documentation

## 2018-02-14 DIAGNOSIS — Z9221 Personal history of antineoplastic chemotherapy: Secondary | ICD-10-CM | POA: Diagnosis not present

## 2018-02-14 DIAGNOSIS — R0602 Shortness of breath: Secondary | ICD-10-CM | POA: Diagnosis not present

## 2018-02-14 DIAGNOSIS — I10 Essential (primary) hypertension: Secondary | ICD-10-CM | POA: Diagnosis not present

## 2018-02-14 LAB — CBC WITH DIFFERENTIAL/PLATELET
BASOS PCT: 0 %
Basophils Absolute: 0 10*3/uL (ref 0.0–0.1)
EOS ABS: 0 10*3/uL (ref 0.0–0.7)
Eosinophils Relative: 0 %
HCT: 31.4 % — ABNORMAL LOW (ref 36.0–46.0)
HEMOGLOBIN: 10.4 g/dL — AB (ref 12.0–15.0)
Lymphocytes Relative: 3 %
Lymphs Abs: 0.3 10*3/uL — ABNORMAL LOW (ref 0.7–4.0)
MCH: 30.3 pg (ref 26.0–34.0)
MCHC: 33.1 g/dL (ref 30.0–36.0)
MCV: 91.5 fL (ref 78.0–100.0)
Monocytes Absolute: 0.1 10*3/uL (ref 0.1–1.0)
Monocytes Relative: 1 %
NEUTROS ABS: 10.2 10*3/uL — AB (ref 1.7–7.7)
Neutrophils Relative %: 96 %
Platelets: 70 10*3/uL — ABNORMAL LOW (ref 150–400)
RBC: 3.43 MIL/uL — ABNORMAL LOW (ref 3.87–5.11)
RDW: 15.9 % — ABNORMAL HIGH (ref 11.5–15.5)
WBC: 10.6 10*3/uL — ABNORMAL HIGH (ref 4.0–10.5)

## 2018-02-14 LAB — COMPREHENSIVE METABOLIC PANEL
ALBUMIN: 3.5 g/dL (ref 3.5–5.0)
ALT: 16 U/L (ref 14–54)
AST: 18 U/L (ref 15–41)
Alkaline Phosphatase: 66 U/L (ref 38–126)
Anion gap: 10 (ref 5–15)
BUN: 17 mg/dL (ref 6–20)
CO2: 24 mmol/L (ref 22–32)
Calcium: 8.9 mg/dL (ref 8.9–10.3)
Chloride: 105 mmol/L (ref 101–111)
Creatinine, Ser: 0.7 mg/dL (ref 0.44–1.00)
GFR calc Af Amer: >60 mL/min (ref >60)
Glucose, Bld: 101 mg/dL — ABNORMAL HIGH (ref 65–99)
POTASSIUM: 3.2 mmol/L — AB (ref 3.5–5.1)
Sodium: 139 mmol/L (ref 135–145)
TOTAL PROTEIN: 5.7 g/dL — AB (ref 6.5–8.1)
Total Bilirubin: 1.9 mg/dL — ABNORMAL HIGH (ref 0.3–1.2)

## 2018-02-14 LAB — BRAIN NATRIURETIC PEPTIDE: B NATRIURETIC PEPTIDE 5: 101.4 pg/mL — AB (ref 0.0–100.0)

## 2018-02-14 LAB — TROPONIN I: Troponin I: <0.03 ng/mL (ref <0.03)

## 2018-02-14 LAB — PHOSPHORUS: Phosphorus: 2.2 mg/dL — ABNORMAL LOW (ref 2.5–4.6)

## 2018-02-14 MED ORDER — HEPARIN SOD (PORK) LOCK FLUSH 100 UNIT/ML IV SOLN
500.0000 [IU] | Freq: Once | INTRAVENOUS | Status: AC
  Administered 2018-02-14: 500 [IU]
  Filled 2018-02-14: qty 5

## 2018-02-14 MED ORDER — POTASSIUM CHLORIDE CRYS ER 20 MEQ PO TBCR: 40.0000 meq | EXTENDED_RELEASE_TABLET | Freq: Once | ORAL | Status: DC

## 2018-02-14 MED ORDER — POTASSIUM PHOSPHATE MONOBASIC 500 MG PO TABS
500.0000 mg | ORAL_TABLET | Freq: Once | ORAL | Status: AC
  Administered 2018-02-14: 500 mg via ORAL
  Filled 2018-02-14: qty 1

## 2018-02-14 NOTE — ED Provider Notes (Signed)
[facility] DEPT Provider Note   CSN: [id number] Arrival date & time: 02/14/18  0802     History   Chief Complaint Chief Complaint  Patient presents with  . Shortness of Breath    HPI Alicia [name] is a 77 y.o. female.  77 year old female with prior history of non-Hodgkin's lymphoma, hypertension, hyperphosphatemia, hypercholesterolemia presents with complaint of shortness of breath.  Patient reports that this is been a recurrent problem over the last several weeks.  She reports that after getting chemo and a Neulasta shot she can develop mild shortness of breath.  She has been admitted for this problem before.  She reports recurrent symptoms that started last night.  Her last admission was for the same symptoms and she was diagnosed with a low phosphorus.  She denies current chest pain, fever, nausea, vomiting, diaphoresis, or other acute complaint.  She reports that she actually feels better now than she did last night.  The history is provided by the patient.  Shortness of Breath  This is a recurrent problem. The problem occurs intermittently.The current episode started 12 to 24 hours ago. The problem has been gradually improving. Pertinent negatives include no fever, no headaches, no cough, no chest pain, no leg pain and no leg swelling. She has tried nothing for the symptoms. The treatment provided no relief. She has had prior hospitalizations. She has had prior ED visits. She has had no prior ICU admissions.    Past Medical History:  Diagnosis Date  . Arthritis    back and hips  . GERD (gastroesophageal reflux disease)    TUMS  . Hypercholesterolemia   . Hypertension   . Non Hodgkin's lymphoma [facility])     Patient Active Problem List   Diagnosis Date Noted  . Encounter for antineoplastic chemotherapy 02/08/2018  . Hypophosphatemia 01/24/2018  . Dyspnea 01/24/2018  . HTN (hypertension) 01/24/2018  . Thrombocytopenia ([place]) 01/24/2018  .  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 Counseling regarding advanced care planning and goals of care 12/20/2017    Past Surgical History:  Procedure Laterality Date  . LYMPH NODE BIOPSY Left 11/27/2017   Procedure: LEFT INGUINAL LYMPH NODE BIOPSY;  Surgeon: Stark Klein, MD;  Location: [facility];  Service: General;  Laterality: Left;  . PORTACATH PLACEMENT Left 12/12/2017   Procedure: INSERTION PORT-A-CATH;  Surgeon: Stark Klein, MD;  Location: [facility];  Service: General;  Laterality: Left;  . TONSILLECTOMY       OB History   None      Home Medications    Prior to Admission medications   Medication Sig Start Date End Date Taking? Authorizing Provider  acetaminophen (TYLENOL) 500 MG tablet Take 500 mg by mouth at bedtime as needed for moderate pain.    Yes [provider]  bisoprolol-hydrochlorothiazide (ZIAC) 5-6.25 MG tablet Take 1 tablet by mouth 2 (two) times daily.   Yes [provider]  lidocaine-prilocaine (EMLA) cream Apply to affected area once 12/20/17  Yes Kale, Cloria Spring, MD  LORazepam (ATIVAN) 0.5 MG tablet Take 1 tablet (0.5 mg total) by mouth every 6 (six) hours as needed (Nausea or vomiting). Patient taking differently: Take 0.5-1 mg by mouth at bedtime as needed for sleep.  02/08/18  Yes Curcio, Roselie Awkward, NP  magic mouthwash w/lidocaine SOLN Take 5 mLs by mouth 4 (four) times daily as needed for mouth pain. 1 part maalox, 1 part nystain, 1 part viscous lidocaine  01/21/18  Yes Brunetta Genera, MD  Melatonin 10 MG TABS Take 10 mg by mouth at bedtime.    Yes [provider]  naproxen sodium (ALEVE) 220 MG tablet Take 220 mg by mouth daily as needed (pain).   Yes [provider]  omega-3 acid ethyl esters (LOVAZA) 1 g capsule Take 1 g by mouth daily.    Yes [provider]  ondansetron (ZOFRAN) 8 MG tablet Take 1 tablet (8 mg total) by mouth 2 (two) times daily as  needed for refractory nausea / vomiting. From day 3 after chemotherapy. 12/20/17  Yes Brunetta Genera, MD  potassium phosphate, monobasic, (K-PHOS) 500 MG tablet Take 1 tablet (500 mg total) by mouth daily. 01/28/18  Yes Tanner, Lyndon Code., PA-C  predniSONE (DELTASONE) 20 MG tablet Take 3 tablets (60 mg total) by mouth daily. Take on days 1-5 of chemotherapy. 12/20/17  Yes Brunetta Genera, MD  prochlorperazine (COMPAZINE) 10 MG tablet Take 1 tablet (10 mg total) by mouth every 6 (six) hours as needed (Nausea or vomiting). 12/20/17  Yes Brunetta Genera, MD  senna-docusate (SENNA S) 8.6-50 MG tablet Take 2 tablets by mouth 2 (two) times daily. May reduce to 2 tab po HS once bowel movements established. Patient taking differently: Take 1 tablet by mouth daily. May reduce to 2 tab po HS once bowel movements established. 01/03/18  Yes Brunetta Genera, MD  simvastatin (ZOCOR) 10 MG tablet Take 10 mg by mouth daily.   Yes [provider]  ezetimibe-simvastatin (VYTORIN) 10-10 MG tablet Take 1 tablet by mouth at bedtime.    [provider]    Family History No family history on file.  Social History Social History   Tobacco Use  . Smoking status: Never Smoker  . Smokeless tobacco: Never Used  Substance Use Topics  . Alcohol use: No  . Drug use: No     Allergies   Patient has no known allergies.   Review of Systems Review of Systems  Constitutional: Negative for fever.  Respiratory: Positive for shortness of breath. Negative for cough.   Cardiovascular: Negative for chest pain and leg swelling.  Neurological: Negative for headaches.  All other systems reviewed and are negative.    Physical Exam Updated Vital Signs BP 118/66   Pulse 67   Temp (!) 97.1 F (36.2 C) (Axillary) Comment: oral temperature does not register  Resp 16   Ht 5\' 6"  (1.676 m)   Wt 59 kg (130 lb)   SpO2 100%   BMI 20.98 kg/m   Physical Exam  Constitutional: She is oriented to  person, place, and time. She appears well-developed and well-nourished. No distress.  HENT:  Head: Normocephalic and atraumatic.  Mouth/Throat: Oropharynx is clear and moist.  Eyes: Pupils are equal, round, and reactive to light. Conjunctivae and EOM are normal.  Neck: Normal range of motion. Neck supple.  Cardiovascular: Normal rate, regular rhythm and normal heart sounds.  Pulmonary/Chest: Effort normal and breath sounds normal. No respiratory distress.  Abdominal: Soft. She exhibits no distension. There is no tenderness.  Musculoskeletal: Normal range of motion. She exhibits no edema or deformity.  Neurological: She is alert and oriented to person, place, and time.  Skin: Skin is warm and dry.  Psychiatric: She has a normal mood and affect.  Nursing note and vitals reviewed.    ED Treatments / Results  Labs (all labs ordered are listed, but only abnormal results are displayed) Labs Reviewed  COMPREHENSIVE  METABOLIC PANEL - Abnormal; Notable for the following components:      Result Value   Potassium 3.2 (*)    Glucose, Bld 101 (*)    Total Protein 5.7 (*)    Total Bilirubin 1.9 (*)    All other components within normal limits  CBC WITH DIFFERENTIAL/PLATELET - Abnormal; Notable for the following components:   WBC 10.6 (*)    RBC 3.43 (*)    Hemoglobin 10.4 (*)    HCT 31.4 (*)    RDW 15.9 (*)    Platelets 70 (*)    Neutro Abs 10.2 (*)    Lymphs Abs 0.3 (*)    All other components within normal limits  BRAIN NATRIURETIC PEPTIDE - Abnormal; Notable for the following components:   B Natriuretic Peptide 101.4 (*)    All other components within normal limits  PHOSPHORUS - Abnormal; Notable for the following components:   Phosphorus 2.2 (*)    All other components within normal limits  TROPONIN I    EKG EKG Interpretation  Date/Time:  Thursday Feb 14 2018 08:16:48 EDT Ventricular Rate:  73 PR Interval:    QRS Duration: 109 QT Interval:  416 QTC Calculation: 459 R  Axis:   -21 Text Interpretation:  Sinus rhythm Incomplete left bundle branch block Inferior infarct, old Baseline wander in lead(s) V4 Confirmed by Dene Gentry [phone]) on 02/14/2018 8:34:56 AM   Radiology Dg Chest 2 View  Result Date: 02/14/2018 CLINICAL DATA:  Shortness of breath after receiving chemotherapy. EXAM: CHEST - 2 VIEW COMPARISON:  Chest radiographs and CTA 01/24/2018 FINDINGS: Left subclavian Port-A-Cath terminates over the upper SVC, unchanged. The cardiac silhouette is normal in size. The thoracic aorta remains tortuous. No airspace consolidation scratched of there is minimal atelectasis in the left lung base. The lungs are otherwise clear. No pleural effusion or pneumothorax is identified. No acute osseous abnormality is seen. IMPRESSION: No active cardiopulmonary disease. Electronically Signed   By: Logan Bores M.D.   On: 02/14/2018 09:21    Procedures Procedures (including critical care time)  Medications Ordered in ED Medications - No data to display   Initial Impression / Assessment and Plan / ED Course  I have reviewed the triage vital signs and the nursing notes.  Pertinent labs & imaging results that were available during my care of the patient were reviewed by me and considered in my medical decision making (see chart for details).     MDM  Screen complete  Patient is presenting with an episode of shortness of breath following recent chemo treatment.  Patient's symptoms are recurrent.  Screening exam does not suggest significant new pathology.  Baseline labs reveal a mildly decreased potassium and phosphorus.  Patient is already on repletion therapy for these.  She is given an additional dose today in the ED.  Chest x-ray did not reveal any acute process.  Screening labs obtained in the ED did not reveal any other significant abnormalities.  After today's evaluation in the ED the patient desires discharge home.  She reports that she feels significantly improved.   She declines admission or further observation or treatment in the ED.  She is aware of the need for close follow-up.  Strict return precautions are given and understood.  Final Clinical Impressions(s) / ED Diagnoses   Final diagnoses:  Hypophosphatemia  Hypokalemia    ED Discharge Orders    None       Valarie Merino, MD 02/14/18 1101

## 2018-02-14 NOTE — ED Notes (Signed)
[place] MD aware of pt status and complaint.

## 2018-02-14 NOTE — ED Triage Notes (Signed)
Pt complaint of SOB; "common after given chemo" per daughter.

## 2018-02-19 DIAGNOSIS — Z6821 Body mass index (BMI) 21.0-21.9, adult: Secondary | ICD-10-CM | POA: Diagnosis not present

## 2018-02-19 DIAGNOSIS — R0602 Shortness of breath: Secondary | ICD-10-CM | POA: Diagnosis not present

## 2018-02-22 DIAGNOSIS — R0602 Shortness of breath: Secondary | ICD-10-CM | POA: Diagnosis not present

## 2018-02-22 DIAGNOSIS — Z6821 Body mass index (BMI) 21.0-21.9, adult: Secondary | ICD-10-CM | POA: Diagnosis not present

## 2018-02-25 ENCOUNTER — Ambulatory Visit (HOSPITAL_COMMUNITY)
Admission: RE | Admit: 2018-02-25 | Discharge: 2018-02-25 | Disposition: A | Discharge status: Home / Self Care | Payer: Medicare HMO | Source: Ambulatory Visit | Attending: Hematology | Admitting: Hematology

## 2018-02-25 DIAGNOSIS — C8238 Follicular lymphoma grade IIIa, lymph nodes of multiple sites: Secondary | ICD-10-CM | POA: Diagnosis present

## 2018-02-25 DIAGNOSIS — C8190 Hodgkin lymphoma, unspecified, unspecified site: Secondary | ICD-10-CM | POA: Diagnosis not present

## 2018-02-25 DIAGNOSIS — N200 Calculus of kidney: Secondary | ICD-10-CM | POA: Insufficient documentation

## 2018-02-25 DIAGNOSIS — I700 Atherosclerosis of aorta: Secondary | ICD-10-CM | POA: Diagnosis not present

## 2018-02-25 LAB — GLUCOSE, CAPILLARY: Glucose-Capillary: 110 mg/dL — ABNORMAL HIGH (ref 65–99)

## 2018-02-25 MED ORDER — FLUDEOXYGLUCOSE F - 18 (FDG) INJECTION
6.2800 | Freq: Once | INTRAVENOUS | Status: AC | PRN
  Administered 2018-02-25: 6.28 via INTRAVENOUS

## 2018-02-27 ENCOUNTER — Ambulatory Visit: Payer: Medicare HMO | Admitting: Hematology

## 2018-02-27 ENCOUNTER — Other Ambulatory Visit: Payer: Self-pay

## 2018-02-27 NOTE — Progress Notes (Signed)
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 female who has been referred to [place] by Dr. Allyn Kenner for evaluation and management of enlarged lymph node to left inguinal area.   She is accompanied by her daughter today. She notes that she is doing well overall. She notes that she initially presented with a palpable lump to her left inguinal area for approximately 3-4 weeks. She denies pain to the area, but notes that the area feels larger today. She reports that she typically goes to the doctor twice a year for a medication refill.   She had an [place] on 10/19/17 significant for: IMPRESSION: Multiple prominent masses in the left groin with the largest measuring up to 4.4 cm. These are most likely lymph nodes.   Subsequently, she had a CT AP with contrast completed on 10/26/17 with results of: IMPRESSION: CT evidence of lymphoma, with bulky lymph nodes in multiple nodal stations of the abdomen and pelvis, predominantly left inguinal, left iliac, and the para-aortic nodes. 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   She has a PMHx of HTN and high cholesterol which she takes medications for. She denies a PMHx of DM, cardiac issues, abdominal issues, lung issues, surgeries, or major injuries. She denies smoking cigarettes, drinking ETOH, or chemical exposure.  Her father had a hx of kidney cancer and her mother with lung cancer. Her mother did smoke cigarettes. She denies any other family hx of blood disorders or blood cancers. She has two siblings whom have had either a kidney removal or on dialysis respectively. 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    On review of systems, she reports a slower urinary stream, urinary urgency x several years, resolved diarrhea x yesterday, chronic left hip pain x several years, and chronic right shoulder pain (previously diagnosed with bursitis). She denies her urgency being urgent to need an evaluation. She attributes the diarrhea to something that she ate yesterday. She notes a prior hx of infection to her great toe which she self-treated with Listerine soaks and OTC antifungal creams. She denies abdominal pain, vaginal discharge, fever, chills, night sweats, unexpected weight loss, leg swelling, bowel issues, urinary frequency, vaginal bleeding, foot/toe infection, abdominal trauma, bowel habit changes, prior hx of diverticulitis, lumps anywhere else on the body, and any other symptoms.    INTERVAL HISTORY   Alicia GUYETT is here for a follow up and continued management of her high grade follicular lymphadenopathy and evaluation prior to her 4th cycle of R-CHOP. The patient's last visit with [place] was on 01/18/18. She is accompanied today by her son. The pt reports that she is doing well overall.   The pt reports that she had low phosphorous at [facility] on 01/24/18. She is still taking her phosphorous supplement each day.  She notes two acute SOB instances since our last visit, for which she appeared to the ED. She notes that on the third day after receiving her Neulasta injection she would wake up with increasing SOB as she went about her daily activities. She notes that her BP got as high as 782 systolic when she presented to the  ED the first time on 01/24/18. She continues to take her BP medication twice a day as she has for 14-15 years. She notes that she stays short of breath wile walking around. She denies wheezing, leg swelling, chest pain, syncope, sweating. She notes that she has never had a stress test with a cardiologist before. Her at-rest ECHO on 01/24/18 was reassuring.   She is taking 60mg   Prednisone for 5 days.   She notes that she takes Ativan daily for help sleeping.   She received a fluid pill with her PCP last week.   Of note since the patient's last visit, pt has had PET completed on 02/25/18 with results revealing 1. Interval response to therapy with mild residual hypermetabolism in both hilar regions. 2.  Aortic atherosclerosis (ICD10-170.0). 3. Punctate left renal stone.  Lab results today (02/28/18) of CBC, CMP, and Reticulocytes is as follows: all values are WNL except for WBC at 12.6k, RDW at 17.0, ANC at 11.1k, Lymphs Abs at 0.7k, Creatinine at 1.11, Total Protein at 6.1, Retic ct pct at 2.3%.  On review of systems, pt reports SOB, DOE, and denies wheezing, leg swelling, headaches, dizziness, syncope, chest pain, abdominal pains, and any other symptoms.    MEDICAL HISTORY:  Past Medical History:  Diagnosis Date  . Arthritis    back and hips  . GERD (gastroesophageal reflux disease)    TUMS  . Hypercholesterolemia   . Hypertension   . Non Hodgkin's lymphoma ([place])     SURGICAL HISTORY: Past Surgical History:  Procedure Laterality Date  . LYMPH NODE BIOPSY Left 11/27/2017   Procedure: LEFT INGUINAL LYMPH NODE BIOPSY;  Surgeon: Stark Klein, MD;  Location: [place];  Service: General;  Laterality: Left;  . PORTACATH PLACEMENT Left 12/12/2017   Procedure: INSERTION PORT-A-CATH;  Surgeon: Stark Klein, MD;  Location: [place];  Service: General;  Laterality: Left;  . TONSILLECTOMY      SOCIAL HISTORY: Social History   Socioeconomic History  . Marital status: Widowed    Spouse name: Not on file  . Number of children: Not on file  . Years of education: Not on file  . Highest education level: Not on file  Occupational History  . Not on file  Social Needs  . Financial resource strain: Not on file  . Food insecurity:    Worry: Not on file    Inability: Not on file  . Transportation needs:    Medical: Not on file    Non-medical: Not  on file  Tobacco Use  . Smoking status: Never Smoker  . Smokeless tobacco: Never Used  Substance and Sexual Activity  . Alcohol use: No  . Drug use: No  . Sexual activity: Not on file  Lifestyle  . Physical activity:    Days per week: Not on file    Minutes per session: Not on file  . Stress: Not on file  Relationships  . Social connections:    Talks on phone: Not on file    Gets together: Not on file    Attends religious service: Not on file    Active member of club or organization: Not on file    Attends meetings of clubs or organizations: Not on file    Relationship status: Not on file  . Intimate partner violence:    Fear of current or ex partner: Not on file    Emotionally abused: Not on file    Physically abused: Not on file  Forced sexual activity: Not on file  Other Topics Concern  . Not on file  Social History Narrative  . Not on file    FAMILY HISTORY: History reviewed. No pertinent family history.  ALLERGIES:  has No Known Allergies.  MEDICATIONS:  Current Outpatient Medications  Medication Sig Dispense Refill  . acetaminophen (TYLENOL) 500 MG tablet Take 500 mg by mouth at bedtime as needed for moderate pain.     . bisoprolol-hydrochlorothiazide (ZIAC) 5-6.25 MG tablet Take 1 tablet by mouth 2 (two) times daily.    Marland Kitchen lidocaine-prilocaine (EMLA) cream Apply to affected area once 30 g 3  . magic mouthwash w/lidocaine SOLN Take 5 mLs by mouth 4 (four) times daily as needed for mouth pain. 1 part maalox, 1 part nystain, 1 part viscous lidocaine 200 mL 0  . Melatonin 10 MG TABS Take 10 mg by mouth at bedtime.     . naproxen sodium (ALEVE) 220 MG tablet Take 220 mg by mouth daily as needed (pain).    Marland Kitchen omega-3 acid ethyl esters (LOVAZA) 1 g capsule Take 1 g by mouth daily.     . potassium phosphate, monobasic, (K-PHOS) 500 MG tablet Take 1 tablet (500 mg total) by mouth daily. 30 tablet 0  . predniSONE (DELTASONE) 20 MG tablet Take 3 tablets (60 mg total) by  mouth daily. Take on days 1-5 of chemotherapy. 15 tablet 5  . senna-docusate (SENNA S) 8.6-50 MG tablet Take 2 tablets by mouth 2 (two) times daily. May reduce to 2 tab po HS once bowel movements established. 60 tablet 2  . simvastatin (ZOCOR) 10 MG tablet Take 10 mg by mouth daily.    . ergocalciferol (VITAMIN D2) 50000 units capsule Take 1 capsule (50,000 Units total) by mouth 2 (two) times a week. 24 capsule 1  . hydrOXYzine (ATARAX/VISTARIL) 25 MG tablet Take 1 tablet (25 mg total) by mouth at bedtime as needed and may repeat dose one time if needed (insomnia). 60 tablet 1  . LORazepam (ATIVAN) 0.5 MG tablet Take 1 tablet (0.5 mg total) by mouth every 6 (six) hours as needed (Nausea or vomiting). 30 tablet 0  . ondansetron (ZOFRAN) 8 MG tablet Take 1 tablet (8 mg total) by mouth 2 (two) times daily as needed for refractory nausea / vomiting. From day 3 after chemotherapy. (Patient not taking: Reported on 02/28/2018) 30 tablet 1  . prochlorperazine (COMPAZINE) 10 MG tablet Take 1 tablet (10 mg total) by mouth every 6 (six) hours as needed (Nausea or vomiting). (Patient not taking: Reported on 02/28/2018) 30 tablet 6   No current facility-administered medications for this visit.     REVIEW OF SYSTEMS:    A 10+ POINT REVIEW OF SYSTEMS WAS OBTAINED including neurology, dermatology, psychiatry, cardiac, respiratory, lymph, extremities, GI, GU, Musculoskeletal, constitutional, breasts, reproductive, HEENT.  All pertinent positives are noted in the HPI.  All others are negative.    PHYSICAL EXAMINATION:  ECOG PERFORMANCE STATUS: 0 - Asymptomatic  Vitals:   02/28/18 0853  BP: 124/61  Pulse: 65  Resp: 20  Temp: 98 F (36.7 C)  SpO2: 99%   Filed Weights   02/28/18 0853  Weight: 130 lb 12.8 oz (59.3 kg)   .Body mass index is 21.11 kg/m.  GENERAL:alert, in no acute distress and comfortable SKIN: no acute rashes, no significant lesions EYES: conjunctiva are pink and non-injected, sclera  anicteric OROPHARYNX: MMM, no exudates, no oropharyngeal erythema or ulceration NECK: supple, no JVD LYMPH:  no palpable lymphadenopathy in  the cervical, axillary or inguinal regions LUNGS: clear to auscultation b/l with normal respiratory effort HEART: regular rate & rhythm ABDOMEN:  normoactive bowel sounds , non tender, not distended. Extremity: no pedal edema PSYCH: alert & oriented x 3 with fluent speech NEURO: no focal motor/sensory deficits   LABORATORY DATA:  I have reviewed the data as listed  . CBC Latest Ref Rng & Units 02/28/2018 02/14/2018 02/08/2018  WBC 3.9 - 10.3 K/uL 12.6(H) 10.6(H) 9.7  Hemoglobin 11.6 - 15.9 g/dL 11.6 10.4(L) 11.9  Hematocrit 34.8 - 46.6 % 35.5 31.4(L) 35.9  Platelets 145 - 400 K/uL 220 70(L) 272  HGB 12.2 ANC 11.1k . CBC    Component Value Date/Time   WBC 12.6 (H) 02/28/2018 0824   WBC 10.6 (H) 02/14/2018 0914   RBC 3.77 02/28/2018 0824   RBC 3.77 02/28/2018 0824   HGB 11.6 02/28/2018 0824   HCT 35.5 02/28/2018 0824   PLT 220 02/28/2018 0824   MCV 94.2 02/28/2018 0824   MCH 30.8 02/28/2018 0824   MCHC 32.7 02/28/2018 0824   RDW 17.0 (H) 02/28/2018 0824   LYMPHSABS 0.7 (L) 02/28/2018 0824   MONOABS 0.7 02/28/2018 0824   EOSABS 0.0 02/28/2018 0824   BASOSABS 0.1 02/28/2018 0824   . CMP Latest Ref Rng & Units 02/28/2018 02/14/2018 02/08/2018  Glucose 70 - 140 mg/dL 102 101(H) 116(H)  BUN 7 - 26 mg/dL 17 17 15   Creatinine 0.60 - 1.10 mg/dL 1.11(H) 0.70 1.11(H)  Sodium 136 - 145 mmol/L 141 139 144  Potassium 3.5 - 5.1 mmol/L 3.7 3.2(L) 3.9  Chloride 98 - 109 mmol/L 108 105 108  CO2 22 - 29 mmol/L 27 24 25   Calcium 8.4 - 10.4 mg/dL 9.2 8.9 9.1  Total Protein 6.4 - 8.3 g/dL 6.1(L) 5.7(L) 6.0(L)  Total Bilirubin 0.2 - 1.2 mg/dL 0.5 1.9(H) 0.7  Alkaline Phos 40 - 150 U/L 70 66 64  AST 5 - 34 U/L 21 18 23   ALT 0 - 55 U/L 15 16 16    . Lab Results  Component Value Date   LDH 206 02/08/2018   Component     Latest Ref Rng & Units  11/01/2017  Retic Ct Pct     0.7 - 2.1 % 1.2  RBC.     3.70 - 5.45 MIL/uL 5.18  Retic Count, Absolute     33.7 - 90.7 K/uL 62.2  Hep B Core Ab, Tot     Negative Negative  Hepatitis B Surface Ag     Negative Negative  HCV Ab     0.0 - 0.9 s/co ratio <0.1  HIV Screen 4th Generation wRfx     Non Reactive Non Reactive  Sed Rate     0 - 22 mm/hr 1    PATHOLOGY         :        PROCEDURES  ECHO 11/22/17 Study Conclusions - Left ventricle: The cavity size was normal. Septal wall thickness   was increased in a pattern of moderate LVH with otherwise mild   concentric hypertrophy. Systolic function was normal. The   estimated ejection fraction was in the range of 60% to 65%. Wall   motion was normal; there were no regional wall motion   abnormalities. Doppler parameters are consistent with abnormal   left ventricular relaxation (grade 1 diastolic dysfunction).   Doppler parameters are consistent with indeterminate ventricular   filling pressure. - Aortic valve: Transvalvular velocity was within the normal range.  There was no stenosis. There was mild regurgitation. - Mitral valve: Transvalvular velocity was within the normal range.   There was no evidence for stenosis. There was no regurgitation. - Right ventricle: The cavity size was normal. Wall thickness was   normal. Systolic function was normal. - Tricuspid valve: There was mild regurgitation. - Pulmonary arteries: Systolic pressure was within the normal   range. PA peak pressure: 27 mm Hg (S). - Global longitudinal strain -17.7% (normal)    *[place], [place] [address]                            [phone]  ------------------------------------------------------------------- Transthoracic Echocardiography  Patient:    Aalaysia, Liggins MR #:       [id number] Study Date: 01/24/2018 Gender:     F Age:         89 Height:     167.6 cm Weight:     59 kg BSA:        1.66 m^2 Pt. Status: Room:       [address]    Kathie Dike [id number]  VHQIONGEX    BMWUX, LKGMWNUU [address] [id number]  Ester Rink [id number]  PERFORMING   Chmg, Forestine Na  SONOGRAPHER  Alvino Chapel, RCS  cc:  ------------------------------------------------------------------- LV EF: 65% -   70%  ------------------------------------------------------------------- Indications:      Dyspnea 786.09.  ------------------------------------------------------------------- History:   PMH:  Acquired from the patient and from the patient&'s chart.  PMH:  Chemotherapy. Grade 3a follicular lymphoma of lymph nodes of multiple regions  Risk factors:  Hypertension.  ------------------------------------------------------------------- Study Conclusions  - Left ventricle: The cavity size was normal. Wall thickness was   increased in a pattern of mild LVH. Systolic function was   vigorous. The estimated ejection fraction was in the range of 65%   to 70%. - Aortic valve: There was mild regurgitation.   RADIOGRAPHIC STUDIES: I have personally reviewed the radiological images as listed and agreed with the findings in the report. Dg Chest 2 View  Result Date: 02/14/2018 CLINICAL DATA:  Shortness of breath after receiving chemotherapy. EXAM: CHEST - 2 VIEW COMPARISON:  Chest radiographs and CTA 01/24/2018 FINDINGS: Left subclavian Port-A-Cath terminates over the upper SVC, unchanged. The cardiac silhouette is normal in size. The thoracic aorta remains tortuous. No airspace consolidation scratched of there is minimal atelectasis in the left lung base. The lungs are otherwise clear. No pleural effusion or pneumothorax is identified. No acute osseous abnormality is seen. IMPRESSION: No active cardiopulmonary disease. Electronically Signed   By: Logan Bores M.D.   On: 02/14/2018 09:21   Nm Pet  Image Restag (ps) Skull Base To Thigh  Result Date: 02/25/2018 CLINICAL DATA:  Subsequent treatment strategy for lymphoma. EXAM: NUCLEAR MEDICINE PET SKULL BASE TO THIGH TECHNIQUE: 6.3 mCi F-18 FDG was injected intravenously. Full-ring PET imaging was performed from the skull base to thigh after the radiotracer. CT data was obtained and used for attenuation correction and anatomic localization. Fasting blood glucose: 110 mg/dl COMPARISON:  CT chest 01/24/2017 and PET  11/19/2017. CT abdomen pelvis 10/26/2017. FINDINGS: Mediastinal blood pool activity: SUV max 2.2 NECK: No hypermetabolic lymph nodes in the neck. Incidental CT findings: None. CHEST: Mild bihilar hypermetabolism is seen with an SUV max on the left of 5.3, and on the right, 3.8. Definitive CT correlates are difficult to assess, without IV contrast. No hypermetabolic mediastinal or axillary lymph nodes. No hypermetabolic pulmonary nodules. Incidental CT findings: Heart is enlarged. Atherosclerotic calcification of the arterial vasculature. Left-sided Port-A-Cath terminates in the SVC. No pericardial or pleural effusion. ABDOMEN/PELVIS: No abnormal hypermetabolism in the liver, adrenal glands, spleen or pancreas. No hypermetabolic lymph nodes. Incidental CT findings: Punctate left renal stone. Atherosclerotic calcification of the arterial vasculature without abdominal aortic aneurysm. No free fluid. SKELETON: Mild diffuse osseous hypermetabolism is likely treatment related. 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 Degenerative changes in the spine. IMPRESSION: 1. Interval response to therapy with mild residual hypermetabolism in both hilar regions. 2.  Aortic atherosclerosis (ICD10-170.0). 3. Punctate left renal stone. Electronically Signed   By: Lorin Picket M.D.   On: 02/25/2018 09:33    ASSESSMENT & PLAN:  77 y.o. is a female with prior hx of HTN and high cholesterol    1. Recently Diagnosed High grade Follicular  lymphoma 2. Significant intra-abdominal generalized lymphadenopathy from high grade FL  11/08/17 and 11/27/17 LN Biopsy confirms Non-Hodgkin's B Cell Lymphoma -follicular lymphoma High Grade (grade 3) HIV non reactive No symptoms suggest an intra-abd /pelvic inflammatory process at this time.  Baseline LDH levels normal at 200 (11/01/17)  Port placed on 12/12/17 [facility] with nl EF  Component     Latest Ref Rng & Units 11/01/2017  Hep B Core Ab, Tot     Negative Negative  Hepatitis B Surface Ag     Negative Negative  HCV Ab     0.0 - 0.9 s/co ratio <0.1  HIV Screen 4th Generation wRfx     Non Reactive Non Reactive   We reviewed PET scan from 11/19/17 - with enlarged active lymph nodes primarily in her abdomen and small lymph nodes in her chest and neck, evidence of stage IIIA disease.    PLAN -Discussed pt labwork today, 02/28/18; blood counts and chemistries are stable.  -Discussed 02/25/18 PET with pt which revealed  Interval response to therapy with mild residual hypermetabolism in both hilar regions.  -Discussed switching pt from Neulasta to Neupogen in the setting of her SOB that develops three days after receiving Neulasta. She agrees to this.  -Recommended Hydroxyzine in place of Ativan for chronic issues with insomnia even when not on steroids. -Asked pt to take BP readings throughout the day including when she first wakes up to evaluate if Prednisone is increasing her BP. -Also recommended f/u with with cardiology ? Need for stress test, she requests a referral to Dr Percival Spanish -- given referral -Placing pt on high dose Vitamin D replacement twice per week with concerns that she is not able to hold on to phosphorous replacement. -Will see pt back in 2 weeks -Asked pt to call us or present to the ED if she develops SOB again.  -The pt has no prohibitive toxicities from continuing C4 at this time.    3. HTN -She is on HTN medication PLAN  -Given her previous drop of BP I advised her  to continue to closely monitor her BP at home and for her to contact her PCP  4. Interrupted Sleep Cycle  -She has tried melatonin and Tylenol PM -  Recommend using Ativan for help sleeping and to help her anxiety.  -given prescription for hydroxzine  5. Constipation- resolved. likely from Vincristine. -discussed will need to be proactive with use of laxatives. -Recommend 2 tablets twice each day of Senna S with next cycle  To avoid significant constipation. Can reduce to 2tab po HIS prn if diarrhea ensues   Changing Neulasta to neupogen (will need to let [facility] know- patient gets it there) Please schedule f/u with Dr Irene Limbo in 10 days with labs Please schedule C5 of R-CHOP in 3 weeks with labs RTC with Dr Irene Limbo in 3 weeks with C5D1 Referral to Dr Percival Spanish /Cardiology for shortness of breath/DOE PFTs on visit in 10 days   All of the patients questions were answered with apparent satisfaction. The patient knows to call the clinic with any problems, questions or concerns.  . The total time spent in the appointment was 30 minutes and more than 50% was on counseling and direct patient cares.     Sullivan Lone MD [place] AAHIVMS [facility] Hematology/Oncology Physician [facility]  (Office):       [phone] (Work cell):  [phone] (Fax):           [phone]  02/28/2018 10:03 AM  This document serves as a record of services personally performed by Sullivan Lone, MD. It was created on his behalf by Baldwin Jamaica, a trained medical scribe. The creation of this record is based on the scribe's personal observations and the provider's statements to them.   .I have reviewed the above documentation for accuracy and completeness, and I agree with the above. Brunetta Genera MD MS

## 2018-02-28 ENCOUNTER — Inpatient Hospital Stay: Payer: Medicare HMO

## 2018-02-28 ENCOUNTER — Other Ambulatory Visit (HOSPITAL_COMMUNITY): Payer: Self-pay

## 2018-02-28 ENCOUNTER — Inpatient Hospital Stay: Payer: Medicare HMO | Attending: Hematology

## 2018-02-28 ENCOUNTER — Other Ambulatory Visit: Payer: Self-pay | Admitting: *Deleted

## 2018-02-28 ENCOUNTER — Other Ambulatory Visit: Payer: Self-pay

## 2018-02-28 ENCOUNTER — Encounter: Payer: Self-pay | Admitting: Hematology

## 2018-02-28 ENCOUNTER — Inpatient Hospital Stay (HOSPITAL_BASED_OUTPATIENT_CLINIC_OR_DEPARTMENT_OTHER): Payer: Medicare HMO | Admitting: Hematology

## 2018-02-28 VITALS — BP 124/61 | HR 65 | Temp 98.0°F | Resp 20 | Ht 66.0 in | Wt 130.8 lb

## 2018-02-28 VITALS — BP 104/53 | HR 70 | Temp 98.0°F | Resp 17

## 2018-02-28 DIAGNOSIS — F419 Anxiety disorder, unspecified: Secondary | ICD-10-CM | POA: Insufficient documentation

## 2018-02-28 DIAGNOSIS — I10 Essential (primary) hypertension: Secondary | ICD-10-CM | POA: Insufficient documentation

## 2018-02-28 DIAGNOSIS — C8285 Other types of follicular lymphoma, lymph nodes of inguinal region and lower limb: Secondary | ICD-10-CM

## 2018-02-28 DIAGNOSIS — C8590 Non-Hodgkin lymphoma, unspecified, unspecified site: Secondary | ICD-10-CM

## 2018-02-28 DIAGNOSIS — R0602 Shortness of breath: Secondary | ICD-10-CM

## 2018-02-28 DIAGNOSIS — C8238 Follicular lymphoma grade IIIa, lymph nodes of multiple sites: Secondary | ICD-10-CM

## 2018-02-28 DIAGNOSIS — C8248 Follicular lymphoma grade IIIb, lymph nodes of multiple sites: Secondary | ICD-10-CM

## 2018-02-28 DIAGNOSIS — G479 Sleep disorder, unspecified: Secondary | ICD-10-CM | POA: Diagnosis not present

## 2018-02-28 DIAGNOSIS — Z5111 Encounter for antineoplastic chemotherapy: Secondary | ICD-10-CM

## 2018-02-28 DIAGNOSIS — Z79899 Other long term (current) drug therapy: Secondary | ICD-10-CM | POA: Diagnosis not present

## 2018-02-28 DIAGNOSIS — D709 Neutropenia, unspecified: Secondary | ICD-10-CM | POA: Insufficient documentation

## 2018-02-28 DIAGNOSIS — Z7189 Other specified counseling: Secondary | ICD-10-CM

## 2018-02-28 DIAGNOSIS — Z5112 Encounter for antineoplastic immunotherapy: Secondary | ICD-10-CM | POA: Insufficient documentation

## 2018-02-28 DIAGNOSIS — R5383 Other fatigue: Secondary | ICD-10-CM | POA: Insufficient documentation

## 2018-02-28 LAB — CBC WITH DIFFERENTIAL (CANCER CENTER ONLY)
BASOS ABS: 0.1 10*3/uL (ref 0.0–0.1)
BASOS PCT: 1 %
Eosinophils Absolute: 0 10*3/uL (ref 0.0–0.5)
Eosinophils Relative: 0 %
HCT: 35.5 % (ref 34.8–46.6)
HEMOGLOBIN: 11.6 g/dL (ref 11.6–15.9)
LYMPHS PCT: 5 %
Lymphs Abs: 0.7 10*3/uL — ABNORMAL LOW (ref 0.9–3.3)
MCH: 30.8 pg (ref 25.1–34.0)
MCHC: 32.7 g/dL (ref 31.5–36.0)
MCV: 94.2 fL (ref 79.5–101.0)
MONO ABS: 0.7 10*3/uL (ref 0.1–0.9)
Monocytes Relative: 5 %
NEUTROS ABS: 11.1 10*3/uL — AB (ref 1.5–6.5)
NEUTROS PCT: 89 %
Platelet Count: 220 10*3/uL (ref 145–400)
RBC: 3.77 MIL/uL (ref 3.70–5.45)
RDW: 17 % — ABNORMAL HIGH (ref 11.2–14.5)
WBC Count: 12.6 10*3/uL — ABNORMAL HIGH (ref 3.9–10.3)

## 2018-02-28 LAB — CMP (CANCER CENTER ONLY)
ALBUMIN: 3.6 g/dL (ref 3.5–5.0)
ALT: 15 U/L (ref 0–55)
ANION GAP: 6 (ref 3–11)
AST: 21 U/L (ref 5–34)
Alkaline Phosphatase: 70 U/L (ref 40–150)
BUN: 17 mg/dL (ref 7–26)
CHLORIDE: 108 mmol/L (ref 98–109)
CO2: 27 mmol/L (ref 22–29)
CREATININE: 1.11 mg/dL — AB (ref 0.60–1.10)
Calcium: 9.2 mg/dL (ref 8.4–10.4)
GFR, Est AFR Am: 54 mL/min — ABNORMAL LOW (ref >60)
GFR, Estimated: 47 mL/min — ABNORMAL LOW (ref >60)
GLUCOSE: 102 mg/dL (ref 70–140)
Potassium: 3.7 mmol/L (ref 3.5–5.1)
SODIUM: 141 mmol/L (ref 136–145)
Total Bilirubin: 0.5 mg/dL (ref 0.2–1.2)
Total Protein: 6.1 g/dL — ABNORMAL LOW (ref 6.4–8.3)

## 2018-02-28 LAB — RETICULOCYTES
RBC.: 3.77 MIL/uL (ref 3.70–5.45)
RETIC COUNT ABSOLUTE: 86.7 10*3/uL (ref 33.7–90.7)
Retic Ct Pct: 2.3 % — ABNORMAL HIGH (ref 0.7–2.1)

## 2018-02-28 LAB — PHOSPHORUS: PHOSPHORUS: 3.7 mg/dL (ref 2.5–4.6)

## 2018-02-28 LAB — MAGNESIUM: Magnesium: 2.1 mg/dL (ref 1.7–2.4)

## 2018-02-28 LAB — LACTATE DEHYDROGENASE: LDH: 259 U/L — AB (ref 125–245)

## 2018-02-28 MED ORDER — SODIUM CHLORIDE 0.9% FLUSH
10.0000 mL | INTRAVENOUS | Status: DC | PRN
  Administered 2018-02-28: 10 mL
  Filled 2018-02-28: qty 10

## 2018-02-28 MED ORDER — VINCRISTINE SULFATE CHEMO INJECTION 1 MG/ML
2.0000 mg | Freq: Once | INTRAVENOUS | Status: AC
  Administered 2018-02-28: 2 mg via INTRAVENOUS
  Filled 2018-02-28: qty 2

## 2018-02-28 MED ORDER — DOXORUBICIN HCL CHEMO IV INJECTION 2 MG/ML
50.0000 mg/m2 | Freq: Once | INTRAVENOUS | Status: AC
  Administered 2018-02-28: 84 mg via INTRAVENOUS
  Filled 2018-02-28: qty 42

## 2018-02-28 MED ORDER — SODIUM CHLORIDE 0.9 % IV SOLN
Freq: Once | INTRAVENOUS | Status: AC
  Administered 2018-02-28: 11:00:00 via INTRAVENOUS

## 2018-02-28 MED ORDER — DEXAMETHASONE SODIUM PHOSPHATE 10 MG/ML IJ SOLN
10.0000 mg | Freq: Once | INTRAMUSCULAR | Status: AC
  Administered 2018-02-28: 10 mg via INTRAVENOUS

## 2018-02-28 MED ORDER — SODIUM CHLORIDE 0.9 % IV SOLN
750.0000 mg/m2 | Freq: Once | INTRAVENOUS | Status: AC
  Administered 2018-02-28: 1260 mg via INTRAVENOUS
  Filled 2018-02-28: qty 63

## 2018-02-28 MED ORDER — DIPHENHYDRAMINE HCL 25 MG PO CAPS
ORAL_CAPSULE | ORAL | Status: AC
  Filled 2018-02-28: qty 2

## 2018-02-28 MED ORDER — ACETAMINOPHEN 325 MG PO TABS
ORAL_TABLET | ORAL | Status: AC
  Filled 2018-02-28: qty 2

## 2018-02-28 MED ORDER — PALONOSETRON HCL INJECTION 0.25 MG/5ML
INTRAVENOUS | Status: AC
  Filled 2018-02-28: qty 5

## 2018-02-28 MED ORDER — ERGOCALCIFEROL 1.25 MG (50000 UT) PO CAPS: 50000.0000 [IU] | ORAL_CAPSULE | ORAL | 1 refills | Status: DC

## 2018-02-28 MED ORDER — ACETAMINOPHEN 325 MG PO TABS
650.0000 mg | ORAL_TABLET | Freq: Once | ORAL | Status: AC
  Administered 2018-02-28: 650 mg via ORAL

## 2018-02-28 MED ORDER — PALONOSETRON HCL INJECTION 0.25 MG/5ML
0.2500 mg | Freq: Once | INTRAVENOUS | Status: AC
  Administered 2018-02-28: 0.25 mg via INTRAVENOUS

## 2018-02-28 MED ORDER — HEPARIN SOD (PORK) LOCK FLUSH 100 UNIT/ML IV SOLN
500.0000 [IU] | Freq: Once | INTRAVENOUS | Status: AC | PRN
  Administered 2018-02-28: 500 [IU]
  Filled 2018-02-28: qty 5

## 2018-02-28 MED ORDER — DIPHENHYDRAMINE HCL 25 MG PO CAPS
50.0000 mg | ORAL_CAPSULE | Freq: Once | ORAL | Status: AC
  Administered 2018-02-28: 50 mg via ORAL

## 2018-02-28 MED ORDER — DEXAMETHASONE SODIUM PHOSPHATE 10 MG/ML IJ SOLN
INTRAMUSCULAR | Status: AC
  Filled 2018-02-28: qty 1

## 2018-02-28 MED ORDER — SODIUM CHLORIDE 0.9 % IV SOLN
375.0000 mg/m2 | Freq: Once | INTRAVENOUS | Status: AC
  Administered 2018-02-28: 600 mg via INTRAVENOUS
  Filled 2018-02-28: qty 50

## 2018-02-28 NOTE — Progress Notes (Signed)
Per Dr Irene Limbo ok for pt to receive Granix  shots 5/17, 5/20, 5/21, 5/22 Forestine Na

## 2018-02-28 NOTE — Patient Instructions (Signed)
[facility] Discharge Instructions for Patients Receiving Chemotherapy  Today you received the following chemotherapy agents Adriamycin, Vincristine, Cytoxan, Rituxan.  To help prevent nausea and vomiting after your treatment, we encourage you to take your nausea medication as directed.  If you develop nausea and vomiting that is not controlled by your nausea medication, call the clinic.   BELOW ARE SYMPTOMS THAT SHOULD BE REPORTED IMMEDIATELY:  *FEVER GREATER THAN 100.5 F  *CHILLS WITH OR WITHOUT FEVER  NAUSEA AND VOMITING THAT IS NOT CONTROLLED WITH YOUR NAUSEA MEDICATION  *UNUSUAL SHORTNESS OF BREATH  *UNUSUAL BRUISING OR BLEEDING  TENDERNESS IN MOUTH AND THROAT WITH OR WITHOUT PRESENCE OF ULCERS  *URINARY PROBLEMS  *BOWEL PROBLEMS  UNUSUAL RASH Items with * indicate a potential emergency and should be followed up as soon as possible.  Feel free to call the clinic should you have any questions or concerns. The clinic phone number is [phone].  Please show the CHEMO ALERT CARD at check-in to the Emergency Department and triage nurse.   

## 2018-03-01 ENCOUNTER — Ambulatory Visit: Payer: Medicare HMO

## 2018-03-01 ENCOUNTER — Other Ambulatory Visit: Payer: Medicare HMO

## 2018-03-01 ENCOUNTER — Inpatient Hospital Stay (HOSPITAL_COMMUNITY): Payer: Medicare HMO | Attending: Internal Medicine

## 2018-03-01 ENCOUNTER — Ambulatory Visit: Payer: Medicare HMO | Admitting: Hematology

## 2018-03-01 VITALS — BP 117/70 | HR 70 | Temp 98.2°F | Resp 16

## 2018-03-01 DIAGNOSIS — Z5189 Encounter for other specified aftercare: Secondary | ICD-10-CM | POA: Diagnosis present

## 2018-03-01 DIAGNOSIS — C8285 Other types of follicular lymphoma, lymph nodes of inguinal region and lower limb: Secondary | ICD-10-CM | POA: Diagnosis present

## 2018-03-01 DIAGNOSIS — Z7189 Other specified counseling: Secondary | ICD-10-CM

## 2018-03-01 DIAGNOSIS — C8238 Follicular lymphoma grade IIIa, lymph nodes of multiple sites: Secondary | ICD-10-CM

## 2018-03-01 LAB — VITAMIN D 25 HYDROXY (VIT D DEFICIENCY, FRACTURES): Vit D, 25-Hydroxy: 21.9 ng/mL — ABNORMAL LOW (ref 30.0–100.0)

## 2018-03-01 MED ORDER — FILGRASTIM 480 MCG/0.8ML IJ SOSY
480.0000 ug | PREFILLED_SYRINGE | Freq: Once | INTRAMUSCULAR | Status: AC
  Administered 2018-03-01: 480 ug via SUBCUTANEOUS
  Filled 2018-03-01: qty 0.8

## 2018-03-01 MED ORDER — TBO-FILGRASTIM 480 MCG/0.8ML ~~LOC~~ SOSY: 480.0000 ug | PREFILLED_SYRINGE | Freq: Once | SUBCUTANEOUS | Status: DC

## 2018-03-01 NOTE — Patient Instructions (Signed)
[place] at [facility] Discharge Instructions  Injection given today Follow up as scheduled.   Thank you for choosing [place] at [facility] to provide your oncology and hematology care.  To afford each patient quality time with our provider, please arrive at least 15 minutes before your scheduled appointment time.   If you have a lab appointment with the [facility] please come in thru the  Main Entrance and check in at the main information desk  You need to re-schedule your appointment should you arrive 10 or more minutes late.  We strive to give you quality time with our providers, and arriving late affects you and other patients whose appointments are after yours.  Also, if you no show three or more times for appointments you may be dismissed from the clinic at the providers discretion.     Again, thank you for choosing [facility].  Our hope is that these requests will decrease the amount of time that you wait before being seen by our physicians.       _____________________________________________________________  Should you have questions after your visit to [facility], please contact our office at [phone] between the hours of 8:30 a.m. and 4:30 p.m.  Voicemails left after 4:30 p.m. will not be returned until the following business day.  For prescription refill requests, have your pharmacy contact our office.       Resources For Cancer Patients and their Caregivers ? American Cancer Society: Can assist with transportation, wigs, general needs, runs Look Good Feel Better.        ([phone] 149-5856 ? Cancer Care: Provides financial assistance, online support groups, medication/co-pay assistance.  1-800-813-HOPE 203-668-3602) ? [place] Assists [place] cancer patients and their families through emotional , educational and financial support.  [phone] ? [place]  DSS Where to apply for food stamps, Medicaid and utility assistance. 2504902054 ? RC[employer]: Transportation to medical appointments. 5304218943 ? Social Security Administration: May apply for disability if have a Stage IV cancer. (308) 115-8819 (361)760-8006 ? [employer], Disability and Transit Services: Assists with nutrition, care and transit needs. [place] Support Programs:   > Cancer Support Group  2nd Tuesday of the month 1pm-2pm, Journey Room   > Creative Journey  3rd Tuesday of the month 1130am-1pm, Journey Room

## 2018-03-01 NOTE — Progress Notes (Signed)
Injection given today per orders. See MAR for details.    Vitals stable and discharged home from clinic ambulatory. Follow up as scheduled.

## 2018-03-02 ENCOUNTER — Ambulatory Visit: Payer: Medicare HMO

## 2018-03-04 ENCOUNTER — Ambulatory Visit (HOSPITAL_COMMUNITY): Payer: Medicare HMO

## 2018-03-04 ENCOUNTER — Inpatient Hospital Stay (HOSPITAL_COMMUNITY): Payer: Medicare HMO

## 2018-03-04 ENCOUNTER — Other Ambulatory Visit: Payer: Self-pay

## 2018-03-04 ENCOUNTER — Ambulatory Visit: Payer: Medicare HMO

## 2018-03-04 ENCOUNTER — Encounter (HOSPITAL_COMMUNITY): Payer: Self-pay

## 2018-03-04 VITALS — BP 130/74 | HR 58 | Temp 97.8°F | Resp 18

## 2018-03-04 DIAGNOSIS — C8285 Other types of follicular lymphoma, lymph nodes of inguinal region and lower limb: Secondary | ICD-10-CM | POA: Diagnosis not present

## 2018-03-04 DIAGNOSIS — C8238 Follicular lymphoma grade IIIa, lymph nodes of multiple sites: Secondary | ICD-10-CM

## 2018-03-04 MED ORDER — FILGRASTIM 480 MCG/0.8ML IJ SOSY
480.0000 ug | PREFILLED_SYRINGE | Freq: Once | INTRAMUSCULAR | Status: AC
  Administered 2018-03-04: 480 ug via SUBCUTANEOUS
  Filled 2018-03-04: qty 0.8

## 2018-03-04 MED ORDER — TBO-FILGRASTIM 480 MCG/0.8ML ~~LOC~~ SOSY: 480.0000 ug | PREFILLED_SYRINGE | Freq: Once | SUBCUTANEOUS | Status: DC

## 2018-03-04 NOTE — Progress Notes (Signed)
Alicia [name] presents today for injection per MD orders. Neupogen 464mcg administered SQ in right Upper Arm. Administration without incident. Patient tolerated well.

## 2018-03-05 ENCOUNTER — Inpatient Hospital Stay (HOSPITAL_COMMUNITY): Payer: Medicare HMO

## 2018-03-05 ENCOUNTER — Other Ambulatory Visit: Payer: Self-pay | Admitting: Hematology

## 2018-03-05 VITALS — BP 112/46 | HR 63 | Temp 98.6°F | Resp 16

## 2018-03-05 DIAGNOSIS — Z7189 Other specified counseling: Secondary | ICD-10-CM

## 2018-03-05 DIAGNOSIS — T451X5A Adverse effect of antineoplastic and immunosuppressive drugs, initial encounter: Principal | ICD-10-CM

## 2018-03-05 DIAGNOSIS — C8238 Follicular lymphoma grade IIIa, lymph nodes of multiple sites: Secondary | ICD-10-CM

## 2018-03-05 DIAGNOSIS — C8285 Other types of follicular lymphoma, lymph nodes of inguinal region and lower limb: Secondary | ICD-10-CM | POA: Diagnosis not present

## 2018-03-05 DIAGNOSIS — D701 Agranulocytosis secondary to cancer chemotherapy: Secondary | ICD-10-CM

## 2018-03-05 MED ORDER — TBO-FILGRASTIM 480 MCG/0.8ML ~~LOC~~ SOSY: 480.0000 ug | PREFILLED_SYRINGE | Freq: Once | SUBCUTANEOUS | Status: DC

## 2018-03-05 MED ORDER — FILGRASTIM 480 MCG/0.8ML IJ SOSY
480.0000 ug | PREFILLED_SYRINGE | Freq: Once | INTRAMUSCULAR | Status: AC
  Administered 2018-03-05: 480 ug via SUBCUTANEOUS
  Filled 2018-03-05: qty 0.8

## 2018-03-05 MED ORDER — LORAZEPAM 0.5 MG PO TABS: 0.5000 mg | ORAL_TABLET | Freq: Four times a day (QID) | ORAL | 0 refills | Status: DC | PRN

## 2018-03-05 MED ORDER — HYDROXYZINE HCL 25 MG PO TABS: 25.0000 mg | ORAL_TABLET | Freq: Every evening | ORAL | 1 refills | Status: DC | PRN

## 2018-03-05 NOTE — Patient Instructions (Signed)
[place] at [facility] Discharge Instructions  Granix given today.  Follow up as scheduled.   Thank you for choosing [place] at [facility] to provide your oncology and hematology care.  To afford each patient quality time with our provider, please arrive at least 15 minutes before your scheduled appointment time.   If you have a lab appointment with the [place] please come in thru the  Main Entrance and check in at the main information desk  You need to re-schedule your appointment should you arrive 10 or more minutes late.  We strive to give you quality time with our providers, and arriving late affects you and other patients whose appointments are after yours.  Also, if you no show three or more times for appointments you may be dismissed from the clinic at the providers discretion.     Again, thank you for choosing [facility].  Our hope is that these requests will decrease the amount of time that you wait before being seen by our physicians.       _____________________________________________________________  Should you have questions after your visit to [facility], please contact our office at [phone] between the hours of 8:30 a.m. and 4:30 p.m.  Voicemails left after 4:30 p.m. will not be returned until the following business day.  For prescription refill requests, have your pharmacy contact our office.       Resources For Cancer Patients and their Caregivers ? American Cancer Society: Can assist with transportation, wigs, general needs, runs Look Good Feel Better.        ([phone] ? Cancer Care: Provides financial assistance, online support groups, medication/co-pay assistance.  1-800-813-HOPE 219-006-9849) ? [place] Assists [place] cancer patients and their families through emotional , educational and financial support.  (602)575-1499 ? [place]  DSS Where to apply for food stamps, Medicaid and utility assistance. 267-723-0576 ? RCATS: Transportation to medical appointments. (203)348-9148 ? Social Security Administration: May apply for disability if have a Stage IV cancer. 613-882-6727 872-776-6487 ? [employer], Disability and Transit Services: Assists with nutrition, care and transit needs. [place] Support Programs:   > Cancer Support Group  2nd Tuesday of the month 1pm-2pm, Journey Room   > Creative Journey  3rd Tuesday of the month 1130am-1pm, Journey Room

## 2018-03-05 NOTE — Progress Notes (Signed)
Alicia [name] presents today for injection per MD orders. Granix 480 mcg administered SQ in left Upper Arm. Administration without incident. Patient tolerated well.   Vitals stable and discharged home from clinic ambulatory. Follow up as scheduled.

## 2018-03-06 ENCOUNTER — Inpatient Hospital Stay (HOSPITAL_COMMUNITY): Payer: Medicare HMO

## 2018-03-06 VITALS — BP 118/71 | HR 71 | Temp 97.7°F | Resp 22

## 2018-03-06 DIAGNOSIS — Z7189 Other specified counseling: Secondary | ICD-10-CM

## 2018-03-06 DIAGNOSIS — C8238 Follicular lymphoma grade IIIa, lymph nodes of multiple sites: Secondary | ICD-10-CM

## 2018-03-06 DIAGNOSIS — C8285 Other types of follicular lymphoma, lymph nodes of inguinal region and lower limb: Secondary | ICD-10-CM | POA: Diagnosis not present

## 2018-03-06 MED ORDER — TBO-FILGRASTIM 480 MCG/0.8ML ~~LOC~~ SOSY: 480.0000 ug | PREFILLED_SYRINGE | Freq: Once | SUBCUTANEOUS | Status: DC

## 2018-03-06 MED ORDER — FILGRASTIM 480 MCG/0.8ML IJ SOSY
480.0000 ug | PREFILLED_SYRINGE | Freq: Once | INTRAMUSCULAR | Status: AC
  Administered 2018-03-06: 480 ug via SUBCUTANEOUS
  Filled 2018-03-06: qty 0.8

## 2018-03-06 NOTE — Patient Instructions (Signed)
[place] at [facility] Discharge Instructions  Granix injection given today Follow up as scheduled.   Thank you for choosing [place] at [facility] to provide your oncology and hematology care.  To afford each patient quality time with our provider, please arrive at least 15 minutes before your scheduled appointment time.   If you have a lab appointment with the [facility] please come in thru the  Main Entrance and check in at the main information desk  You need to re-schedule your appointment should you arrive 10 or more minutes late.  We strive to give you quality time with our providers, and arriving late affects you and other patients whose appointments are after yours.  Also, if you no show three or more times for appointments you may be dismissed from the clinic at the providers discretion.     Again, thank you for choosing [facility].  Our hope is that these requests will decrease the amount of time that you wait before being seen by our physicians.       _____________________________________________________________  Should you have questions after your visit to [facility], please contact our office at [phone] between the hours of 8:30 a.m. and 4:30 p.m.  Voicemails left after 4:30 p.m. will not be returned until the following business day.  For prescription refill requests, have your pharmacy contact our office.       Resources For Cancer Patients and their Caregivers ? American Cancer Society: Can assist with transportation, wigs, general needs, runs Look Good Feel Better.        220-763-2689 ? Cancer Care: Provides financial assistance, online support groups, medication/co-pay assistance.  1-800-813-HOPE (908) 842-0504) ? [place] Assists [place] cancer patients and their families through emotional , educational and financial support.  (442)876-9429 ? [place]  DSS Where to apply for food stamps, Medicaid and utility assistance. 2015139256 ? RCATS: Transportation to medical appointments. (732)661-8763 ? Social Security Administration: May apply for disability if have a Stage IV cancer. 3125970039 505-383-3977 ? [employer], Disability and Transit Services: Assists with nutrition, care and transit needs. [place] Support Programs:   > Cancer Support Group  2nd Tuesday of the month 1pm-2pm, Journey Room   > Creative Journey  3rd Tuesday of the month 1130am-1pm, Journey Room

## 2018-03-06 NOTE — Progress Notes (Signed)
Patient presented today for her granix injection. She stated that when she got up this morning she started breathing faster. She rested off and on and called her son to come over to bring her up to the clinic. Vitals are stable and is afebrile. She stated she is really cold. Denies any fever and chills. 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 Her oncologist is aware of these episodes. She did go to the emergency room before but refuses to go back. Oncologist is aware of this as well and she refuses to go today. Patient stated she just wants to get her shot and go home and rest. Son at bedside.   Upon discharge, patient stated she felt better already and I told her I would call her later to check on her. Her son is going to be with her today.   [name] presents today for injection per MD orders. Granix 480 mcg administered SQ in right Abdomen. Administration without incident. Patient tolerated well.  Vitals stable and discharged home from clinic via wheelchair with son at her side.  Follow up as scheduled.

## 2018-03-08 ENCOUNTER — Ambulatory Visit (HOSPITAL_COMMUNITY)
Admission: RE | Admit: 2018-03-08 | Discharge: 2018-03-08 | Disposition: A | Discharge status: Home / Self Care | Payer: Medicare HMO | Source: Ambulatory Visit | Attending: Hematology | Admitting: Hematology

## 2018-03-08 DIAGNOSIS — R0602 Shortness of breath: Secondary | ICD-10-CM

## 2018-03-08 DIAGNOSIS — C8590 Non-Hodgkin lymphoma, unspecified, unspecified site: Secondary | ICD-10-CM | POA: Diagnosis not present

## 2018-03-08 DIAGNOSIS — C8248 Follicular lymphoma grade IIIb, lymph nodes of multiple sites: Secondary | ICD-10-CM

## 2018-03-08 LAB — PULMONARY FUNCTION TEST
DL/VA % PRED: 66 %
DL/VA: 3.34 ml/min/mmHg/L
DLCO COR % PRED: 48 %
DLCO COR: 13.17 ml/min/mmHg
DLCO unc % pred: 45 %
DLCO unc: 12.38 ml/min/mmHg
FEF 25-75 POST: 2.84 L/s
FEF 25-75 Pre: 2.14 L/sec
FEF2575-%CHANGE-POST: 32 %
FEF2575-%PRED-PRE: 131 %
FEF2575-%Pred-Post: 174 %
FEV1-%Change-Post: 6 %
FEV1-%Pred-Post: 106 %
FEV1-%Pred-Pre: 100 %
FEV1-Post: 2.35 L
FEV1-Pre: 2.2 L
FEV1FVC-%Change-Post: 7 %
FEV1FVC-%PRED-PRE: 106 %
FEV6-%Change-Post: 0 %
FEV6-%PRED-PRE: 98 %
FEV6-%Pred-Post: 98 %
FEV6-Post: 2.76 L
FEV6-Pre: 2.77 L
FEV6FVC-%Change-Post: 0 %
FEV6FVC-%Pred-Post: 105 %
FEV6FVC-%Pred-Pre: 104 %
FVC-%Change-Post: -1 %
FVC-%PRED-POST: 93 %
FVC-%PRED-PRE: 94 %
FVC-POST: 2.76 L
FVC-PRE: 2.79 L
PRE FEV1/FVC RATIO: 79 %
Post FEV1/FVC ratio: 85 %
Post FEV6/FVC ratio: 100 %
Pre FEV6/FVC Ratio: 99 %
RV % pred: 74 %
RV: 1.83 L
TLC % pred: 86 %
TLC: 4.61 L

## 2018-03-08 MED ORDER — ALBUTEROL SULFATE (2.5 MG/3ML) 0.083% IN NEBU
2.5000 mg | INHALATION_SOLUTION | Freq: Once | RESPIRATORY_TRACT | Status: AC
  Administered 2018-03-08: 2.5 mg via RESPIRATORY_TRACT

## 2018-03-11 NOTE — Progress Notes (Signed)
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 female who has been referred to [place] by Dr. Allyn Kenner for evaluation and management of enlarged lymph node to left inguinal area.   She is accompanied by her daughter today. She notes that she is doing well overall. She notes that she initially presented with a palpable lump to her left inguinal area for approximately 3-4 weeks. She denies pain to the area, but notes that the area feels larger today. She reports that she typically goes to the doctor twice a year for a medication refill.   She had an [place] on 10/19/17 significant for: IMPRESSION: Multiple prominent masses in the left groin with the largest measuring up to 4.4 cm. These are most likely lymph nodes.   Subsequently, she had a CT AP with contrast completed on 10/26/17 with results of: IMPRESSION: CT evidence of lymphoma, with bulky lymph nodes in multiple nodal stations of the abdomen and pelvis, predominantly left inguinal, left iliac, and the para-aortic nodes. 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   She has a PMHx of HTN and high cholesterol which she takes medications for. She denies a PMHx of DM, cardiac issues, abdominal issues, lung issues, surgeries, or major injuries. She denies smoking cigarettes, drinking ETOH, or chemical exposure.  Her father had a hx of kidney cancer and her mother with lung cancer. Her mother did smoke cigarettes. She denies any other family hx of blood disorders or blood cancers. She has two siblings whom have had either a kidney removal or on dialysis respectively. 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    On review of systems, she reports a slower urinary stream, urinary urgency x several years, resolved diarrhea x yesterday, chronic left hip pain x several years, and chronic right shoulder pain (previously diagnosed with bursitis). She denies her urgency being urgent to need an evaluation. She attributes the diarrhea to something that she ate yesterday. She notes a prior hx of infection to her great toe which she self-treated with Listerine soaks and OTC antifungal creams. She denies abdominal pain, vaginal discharge, fever, chills, night sweats, unexpected weight loss, leg swelling, bowel issues, urinary frequency, vaginal bleeding, foot/toe infection, abdominal trauma, bowel habit changes, prior hx of diverticulitis, lumps anywhere else on the body, and any other symptoms.    INTERVAL HISTORY   Alicia [name] is here for a follow-up and continued management of her high grade follicular lymphadenopathy and evaluation prior to her 5th cycle of R-CHOP. She is accompanied by her Son.   She has been experiencing worsening SOB, chills and anxiety. Her symptoms start 6 days after her treatment, adding they were the worse after her last ctx despite switching from neulasta to granix.  The more active she is the more she experiences shortness of breath. Adding when she feels anxious her symptoms are exacerbated. Per her son, she was fine in the waiting room, but the walk from the waiting room to the room caused her to have an episode of SOB. She has been monitoring her blood pressure which has not been high. Her symptoms improve right before her next  round of treatment. Adding she is able to go outside and garden without feeling SOB, only experiencing fatigue. She has been eating well and drinking 64 ounces of water a day.   On review of systems, pt denies fever, weight loss, decreased appetite, mouth sores and bilateral lower extremity edema. Denies pain. Pt denies abdominal pain,  nausea, vomiting and bladder and bowel changes.    MEDICAL HISTORY:  Past Medical History:  Diagnosis Date  . Arthritis    back and hips  . GERD (gastroesophageal reflux disease)    TUMS  . Hypercholesterolemia   . Hypertension   . Non Hodgkin's lymphoma ([place])     SURGICAL HISTORY: Past Surgical History:  Procedure Laterality Date  . LYMPH NODE BIOPSY Left 11/27/2017   Procedure: LEFT INGUINAL LYMPH NODE BIOPSY;  Surgeon: Stark Klein, MD;  Location: [place];  Service: General;  Laterality: Left;  . PORTACATH PLACEMENT Left 12/12/2017   Procedure: INSERTION PORT-A-CATH;  Surgeon: Stark Klein, MD;  Location: [place];  Service: General;  Laterality: Left;  . TONSILLECTOMY      SOCIAL HISTORY: Social History   Socioeconomic History  . Marital status: Widowed    Spouse name: Not on file  . Number of children: Not on file  . Years of education: Not on file  . Highest education level: Not on file  Occupational History  . Not on file  Social Needs  . Financial resource strain: Not on file  . Food insecurity:    Worry: Not on file    Inability: Not on file  . Transportation needs:    Medical: Not on file    Non-medical: Not on file  Tobacco Use  . Smoking status: Never Smoker  . Smokeless tobacco: Never Used  Substance and Sexual Activity  . Alcohol use: No  . Drug use: No  . Sexual activity: Not on file  Lifestyle  . Physical activity:    Days per week: Not on file    Minutes per session: Not on file  . Stress: Not on file  Relationships  . Social connections:    Talks on phone: Not on file    Gets together: Not on file    Attends religious service: Not on file    Active member of club or organization: Not on file    Attends meetings of clubs or organizations: Not on file    Relationship status: Not on file  . Intimate partner violence:    Fear of current or ex partner: Not on file    Emotionally abused: Not on file    Physically abused:  Not on file    Forced sexual activity: Not on file  Other Topics Concern  . Not on file  Social History Narrative  . Not on file    FAMILY HISTORY: No family history on file.  ALLERGIES:  has No Known Allergies.  MEDICATIONS:  Current Outpatient Medications  Medication Sig Dispense Refill  . acetaminophen (TYLENOL) 500 MG tablet Take 500 mg by mouth at bedtime as needed for moderate pain.     . bisoprolol-hydrochlorothiazide (ZIAC) 5-6.25 MG tablet Take 1 tablet by mouth 2 (two) times daily.    . ergocalciferol (VITAMIN D2) 50000 units capsule Take 1 capsule (50,000 Units total) by mouth 2 (two) times a week. 24 capsule 1  . hydrOXYzine (ATARAX/VISTARIL) 25 MG tablet Take 1 tablet (25 mg total) by mouth at bedtime as needed and may repeat dose one time if needed (  insomnia). 60 tablet 1  . lidocaine-prilocaine (EMLA) cream Apply to affected area once 30 g 3  . LORazepam (ATIVAN) 0.5 MG tablet Take 1 tablet (0.5 mg total) by mouth every 6 (six) hours as needed (Nausea or vomiting). 30 tablet 0  . magic mouthwash w/lidocaine SOLN Take 5 mLs by mouth 4 (four) times daily as needed for mouth pain. 1 part maalox, 1 part nystain, 1 part viscous lidocaine 200 mL 0  . Melatonin 10 MG TABS Take 10 mg by mouth at bedtime.     . naproxen sodium (ALEVE) 220 MG tablet Take 220 mg by mouth daily as needed (pain).    Marland Kitchen omega-3 acid ethyl esters (LOVAZA) 1 g capsule Take 1 g by mouth daily.     . ondansetron (ZOFRAN) 8 MG tablet Take 1 tablet (8 mg total) by mouth 2 (two) times daily as needed for refractory nausea / vomiting. From day 3 after chemotherapy. (Patient not taking: Reported on 02/28/2018) 30 tablet 1  . potassium phosphate, monobasic, (K-PHOS) 500 MG tablet Take 1 tablet (500 mg total) by mouth daily. 30 tablet 0  . predniSONE (DELTASONE) 20 MG tablet Take 3 tablets (60 mg total) by mouth daily. Take on days 1-5 of chemotherapy. 15 tablet 5  . prochlorperazine (COMPAZINE) 10 MG tablet Take 1  tablet (10 mg total) by mouth every 6 (six) hours as needed (Nausea or vomiting). (Patient not taking: Reported on 02/28/2018) 30 tablet 6  . senna-docusate (SENNA S) 8.6-50 MG tablet Take 2 tablets by mouth 2 (two) times daily. May reduce to 2 tab po HS once bowel movements established. 60 tablet 2  . simvastatin (ZOCOR) 10 MG tablet Take 10 mg by mouth daily.     No current facility-administered medications for this visit.     REVIEW OF SYSTEMS:    .10 Point review of Systems was done is negative except as noted above.  PHYSICAL EXAMINATION:  ECOG PERFORMANCE STATUS: 0 - Asymptomatic  . Vitals:   03/12/18 0855  BP: 126/71  Pulse: 72  Resp: (!) 21  Temp: 98 F (36.7 C)  SpO2: 100%   Filed Weights   03/12/18 0855  Weight: 130 lb 4.8 oz (59.1 kg)   .Body mass index is 19.81 kg/m.  Marland Kitchen GENERAL:alert, in no acute distress and comfortable SKIN: no acute rashes, no significant lesions EYES: conjunctiva are pink and non-injected, sclera anicteric OROPHARYNX: MMM, no exudates, no oropharyngeal erythema or ulceration NECK: supple, no JVD LYMPH:  no palpable lymphadenopathy in the cervical, axillary or inguinal regions LUNGS: clear to auscultation b/l with normal respiratory effort HEART: regular rate & rhythm ABDOMEN:  normoactive bowel sounds , non tender, not distended. Extremity: no pedal edema PSYCH: alert & oriented x 3 with fluent speech NEURO: no focal motor/sensory deficits    LABORATORY DATA:  I have reviewed the data as listed  . CBC Latest Ref Rng & Units 03/12/2018 02/28/2018 02/14/2018  WBC 3.9 - 10.3 K/uL 2.3(L) 12.6(H) 10.6(H)  Hemoglobin 11.6 - 15.9 g/dL 11.1(L) 11.6 10.4(L)  Hematocrit 34.8 - 46.6 % 34.1(L) 35.5 31.4(L)  Platelets 145 - 400 K/uL 138(L) 220 70(L)  ANC 1.3k . CBC    Component Value Date/Time   WBC 2.3 (L) 03/12/2018 0816   RBC 3.62 (L) 03/12/2018 0816   HGB 11.1 (L) 03/12/2018 0816   HGB 11.6 02/28/2018 0824   HCT 34.1 (L) 03/12/2018  0816   PLT 138 (L) 03/12/2018 0816   PLT 220 02/28/2018 9622  MCV 94.2 03/12/2018 0816   MCH 30.7 03/12/2018 0816   MCHC 32.6 03/12/2018 0816   RDW 16.3 (H) 03/12/2018 0816   LYMPHSABS 0.6 (L) 03/12/2018 0816   MONOABS 0.4 03/12/2018 0816   EOSABS 0.0 03/12/2018 0816   BASOSABS 0.1 03/12/2018 0816   . CMP Latest Ref Rng & Units 02/28/2018 02/14/2018 02/08/2018  Glucose 70 - 140 mg/dL 102 101(H) 116(H)  BUN 7 - 26 mg/dL 17 17 15   Creatinine 0.60 - 1.10 mg/dL 1.11(H) 0.70 1.11(H)  Sodium 136 - 145 mmol/L 141 139 144  Potassium 3.5 - 5.1 mmol/L 3.7 3.2(L) 3.9  Chloride 98 - 109 mmol/L 108 105 108  CO2 22 - 29 mmol/L 27 24 25   Calcium 8.4 - 10.4 mg/dL 9.2 8.9 9.1  Total Protein 6.4 - 8.3 g/dL 6.1(L) 5.7(L) 6.0(L)  Total Bilirubin 0.2 - 1.2 mg/dL 0.5 1.9(H) 0.7  Alkaline Phos 40 - 150 U/L 70 66 64  AST 5 - 34 U/L 21 18 23   ALT 0 - 55 U/L 15 16 16    . Lab Results  Component Value Date   LDH 259 (H) 02/28/2018   Component     Latest Ref Rng & Units 11/01/2017  Retic Ct Pct     0.7 - 2.1 % 1.2  RBC.     3.70 - 5.45 MIL/uL 5.18  Retic Count, Absolute     33.7 - 90.7 K/uL 62.2  Hep B Core Ab, Tot     Negative Negative  Hepatitis B Surface Ag     Negative Negative  HCV Ab     0.0 - 0.9 s/co ratio <0.1  HIV Screen 4th Generation wRfx     Non Reactive Non Reactive  Sed Rate     0 - 22 mm/hr 1    PATHOLOGY         :        PROCEDURES  ECHO 11/22/17 Study Conclusions - Left ventricle: The cavity size was normal. Septal wall thickness   was increased in a pattern of moderate LVH with otherwise mild   concentric hypertrophy. Systolic function was normal. The   estimated ejection fraction was in the range of 60% to 65%. Wall   motion was normal; there were no regional wall motion   abnormalities. Doppler parameters are consistent with abnormal   left ventricular relaxation (grade 1 diastolic dysfunction).   Doppler parameters are consistent with indeterminate  ventricular   filling pressure. - Aortic valve: Transvalvular velocity was within the normal range.   There was no stenosis. There was mild regurgitation. - Mitral valve: Transvalvular velocity was within the normal range.   There was no evidence for stenosis. There was no regurgitation. - Right ventricle: The cavity size was normal. Wall thickness was   normal. Systolic function was normal. - Tricuspid valve: There was mild regurgitation. - Pulmonary arteries: Systolic pressure was within the normal   range. PA peak pressure: 27 mm Hg (S). - Global longitudinal strain -17.7% (normal)    RADIOGRAPHIC STUDIES: I have personally reviewed the radiological images as listed and agreed with the findings in the report. Dg Chest 2 View  Result Date: 02/14/2018 CLINICAL DATA:  Shortness of breath after receiving chemotherapy. EXAM: CHEST - 2 VIEW COMPARISON:  Chest radiographs and CTA 01/24/2018 FINDINGS: Left subclavian Port-A-Cath terminates over the upper SVC, unchanged. The cardiac silhouette is normal in size. The thoracic aorta remains tortuous. No airspace consolidation scratched of there is minimal atelectasis in the left  lung base. The lungs are otherwise clear. No pleural effusion or pneumothorax is identified. No acute osseous abnormality is seen. IMPRESSION: No active cardiopulmonary disease. Electronically Signed   By: Logan Bores M.D.   On: 02/14/2018 09:21   Nm Pet Image Restag (ps) Skull Base To Thigh  Result Date: 02/25/2018 CLINICAL DATA:  Subsequent treatment strategy for lymphoma. EXAM: NUCLEAR MEDICINE PET SKULL BASE TO THIGH TECHNIQUE: 6.3 mCi F-18 FDG was injected intravenously. Full-ring PET imaging was performed from the skull base to thigh after the radiotracer. CT data was obtained and used for attenuation correction and anatomic localization. Fasting blood glucose: 110 mg/dl COMPARISON:  CT chest 01/24/2017 and PET 11/19/2017. CT abdomen pelvis 10/26/2017. FINDINGS:  Mediastinal blood pool activity: SUV max 2.2 NECK: No hypermetabolic lymph nodes in the neck. Incidental CT findings: None. CHEST: Mild bihilar hypermetabolism is seen with an SUV max on the left of 5.3, and on the right, 3.8. Definitive CT correlates are difficult to assess, without IV contrast. No hypermetabolic mediastinal or axillary lymph nodes. No hypermetabolic pulmonary nodules. Incidental CT findings: Heart is enlarged. Atherosclerotic calcification of the arterial vasculature. Left-sided Port-A-Cath terminates in the SVC. No pericardial or pleural effusion. ABDOMEN/PELVIS: No abnormal hypermetabolism in the liver, adrenal glands, spleen or pancreas. No hypermetabolic lymph nodes. Incidental CT findings: Punctate left renal stone. Atherosclerotic calcification of the arterial vasculature without abdominal aortic aneurysm. No free fluid. SKELETON: Mild diffuse osseous hypermetabolism is likely treatment related. 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 Degenerative changes in the spine. IMPRESSION: 1. Interval response to therapy with mild residual hypermetabolism in both hilar regions. 2.  Aortic atherosclerosis (ICD10-170.0). 3. Punctate left renal stone. Electronically Signed   By: Lorin Picket M.D.   On: 02/25/2018 09:33    ASSESSMENT & PLAN:  77 y.o. is a female with prior hx of HTN and high cholesterol    1. Recently Diagnosed High grade Follicular lymphoma 2. Significant intra-abdominal generalized lymphadenopathy from high grade FL  11/08/17 and 11/27/17 LN Biopsy confirms Non-Hodgkin's B Cell Lymphoma -follicular lymphoma High Grade (grade 3) HIV non reactive No symptoms suggest an intra-abd /pelvic inflammatory process at this time.  Baseline LDH levels normal at 200 (11/01/17)  Port placed on 12/12/17 [facility] with nl EF  Component     Latest Ref Rng & Units 11/01/2017  Hep B Core Ab, Tot     Negative Negative  Hepatitis B Surface Ag     Negative Negative  HCV  Ab     0.0 - 0.9 s/co ratio <0.1  HIV Screen 4th Generation wRfx     Non Reactive Non Reactive    PET scan from 11/19/17 - with enlarged active lymph nodes primarily in her abdomen and small lymph nodes in her chest and neck, evidence of stage IIIA disease.   PET/CT on 02/25/2018 after 3 cycles of R-CHOP showed  Interval response to therapy with mild residual hypermetabolism  in both hilar regions.  PLAN Labs today stable , mild neutropenia and hypophosphatemia. -replacing phosphorus -patient still with SOB despite switching from neulasta to granix -given increasing shortness of breath -discontinue C5 and C6 chemotherapy appointment and neulasta appointments at this time per patients preference. -Urgent cardiology referral to [facility] cardiology within 1 week for worsening exertional dyspnea r/o CAD -referral to pulmonary for dyspnea evaluation especially given PFTs show mod diffusion defect.  3. HTN -She is on HTN medication PLAN  -Given her previous drop of BP I advised her to continue  to closely monitor her BP at home and for her to contact her PCP  4. Interrupted Sleep Cycle  -She has tried melatonin and Tylenol PM -Recommend using Ativan for help sleeping and to help her anxiety.   5. Constipation- resolved. likely from Vincristine. -discussed will need to be proactive with use of laxatives. -Recommend 2 tablets twice each day of Senna S with next cycle  To avoid significant constipation. Can reduce to 2tab po HIS prn if diarrhea ensues  6. 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 CTA chest 01/24/2018 - no overt infiltrates or PE. -discussed with pulmonary Dr Chase Caller ? Atelectasis - deepp incentive spirometry and pulmonary toilet and plan for f/u HRCT chest. -hold  further CTX pending completion of w/u. -if no acute concerns would consider starting on Rituxan maintenance.  -RTC with Dr Irene Limbo in 4 weeks with labs   All of the patients questions were answered with apparent satisfaction. The patient knows to call the clinic with any problems, questions or concerns.  The total time spent in the appointment was 30 minutes and more than 50% was on counseling and direct patient cares.    Sullivan Lone MD [place] AAHIVMS [facility] Hematology/Oncology Physician [facility]  (Office):       [phone] (Work cell):  [phone] (Fax):           [phone]  03/12/2018 10:12 AM  This document serves as a record of services personally performed by Sullivan Lone, MD. It was created on his behalf by Margit Banda, a trained medical scribe. The creation of this record is based on the scribe's personal observations and the provider's statements to them.   .I have reviewed the above documentation for accuracy and completeness, and I agree with the above. Brunetta Genera MD

## 2018-03-12 ENCOUNTER — Telehealth: Payer: Self-pay | Admitting: Hematology

## 2018-03-12 ENCOUNTER — Inpatient Hospital Stay (HOSPITAL_BASED_OUTPATIENT_CLINIC_OR_DEPARTMENT_OTHER): Payer: Medicare HMO | Admitting: Hematology

## 2018-03-12 ENCOUNTER — Inpatient Hospital Stay: Payer: Medicare HMO

## 2018-03-12 VITALS — BP 126/71 | HR 72 | Temp 98.0°F | Resp 21 | Ht 68.0 in | Wt 130.3 lb

## 2018-03-12 DIAGNOSIS — G4700 Insomnia, unspecified: Secondary | ICD-10-CM

## 2018-03-12 DIAGNOSIS — R0602 Shortness of breath: Secondary | ICD-10-CM

## 2018-03-12 DIAGNOSIS — R5383 Other fatigue: Secondary | ICD-10-CM

## 2018-03-12 DIAGNOSIS — C8590 Non-Hodgkin lymphoma, unspecified, unspecified site: Secondary | ICD-10-CM

## 2018-03-12 DIAGNOSIS — D709 Neutropenia, unspecified: Secondary | ICD-10-CM | POA: Diagnosis not present

## 2018-03-12 DIAGNOSIS — F419 Anxiety disorder, unspecified: Secondary | ICD-10-CM

## 2018-03-12 DIAGNOSIS — R0600 Dyspnea, unspecified: Secondary | ICD-10-CM

## 2018-03-12 DIAGNOSIS — C8285 Other types of follicular lymphoma, lymph nodes of inguinal region and lower limb: Secondary | ICD-10-CM | POA: Diagnosis not present

## 2018-03-12 DIAGNOSIS — C8248 Follicular lymphoma grade IIIb, lymph nodes of multiple sites: Secondary | ICD-10-CM

## 2018-03-12 DIAGNOSIS — I10 Essential (primary) hypertension: Secondary | ICD-10-CM

## 2018-03-12 DIAGNOSIS — R69 Illness, unspecified: Secondary | ICD-10-CM | POA: Diagnosis not present

## 2018-03-12 DIAGNOSIS — Z5112 Encounter for antineoplastic immunotherapy: Secondary | ICD-10-CM | POA: Diagnosis not present

## 2018-03-12 DIAGNOSIS — C8238 Follicular lymphoma grade IIIa, lymph nodes of multiple sites: Secondary | ICD-10-CM

## 2018-03-12 LAB — CBC WITH DIFFERENTIAL/PLATELET
Basophils Absolute: 0.1 10*3/uL (ref 0.0–0.1)
Basophils Relative: 3 %
EOS ABS: 0 10*3/uL (ref 0.0–0.5)
Eosinophils Relative: 1 %
HCT: 34.1 % — ABNORMAL LOW (ref 34.8–46.6)
HEMOGLOBIN: 11.1 g/dL — AB (ref 11.6–15.9)
LYMPHS ABS: 0.6 10*3/uL — AB (ref 0.9–3.3)
Lymphocytes Relative: 26 %
MCH: 30.7 pg (ref 25.1–34.0)
MCHC: 32.6 g/dL (ref 31.5–36.0)
MCV: 94.2 fL (ref 79.5–101.0)
MONOS PCT: 15 %
Monocytes Absolute: 0.4 10*3/uL (ref 0.1–0.9)
Neutro Abs: 1.3 10*3/uL — ABNORMAL LOW (ref 1.5–6.5)
Neutrophils Relative %: 55 %
Platelets: 138 10*3/uL — ABNORMAL LOW (ref 145–400)
RBC: 3.62 MIL/uL — ABNORMAL LOW (ref 3.70–5.45)
RDW: 16.3 % — ABNORMAL HIGH (ref 11.2–14.5)
WBC: 2.3 10*3/uL — ABNORMAL LOW (ref 3.9–10.3)

## 2018-03-12 LAB — CMP (CANCER CENTER ONLY)
ALBUMIN: 3.5 g/dL (ref 3.5–5.0)
ALT: 17 U/L (ref 0–55)
AST: 18 U/L (ref 5–34)
Alkaline Phosphatase: 60 U/L (ref 40–150)
Anion gap: 9 (ref 3–11)
BUN: 13 mg/dL (ref 7–26)
CHLORIDE: 108 mmol/L (ref 98–109)
CO2: 24 mmol/L (ref 22–29)
CREATININE: 0.9 mg/dL (ref 0.60–1.10)
Calcium: 9.7 mg/dL (ref 8.4–10.4)
GFR, Est AFR Am: >60 mL/min (ref >60)
GFR, Estimated: >60 mL/min (ref >60)
Glucose, Bld: 106 mg/dL (ref 70–140)
Potassium: 3.6 mmol/L (ref 3.5–5.1)
Sodium: 141 mmol/L (ref 136–145)
Total Bilirubin: 0.3 mg/dL (ref 0.2–1.2)
Total Protein: 6.2 g/dL — ABNORMAL LOW (ref 6.4–8.3)

## 2018-03-12 LAB — BRAIN NATRIURETIC PEPTIDE: B NATRIURETIC PEPTIDE 5: 123.6 pg/mL — AB (ref 0.0–100.0)

## 2018-03-12 LAB — MAGNESIUM: MAGNESIUM: 1.9 mg/dL (ref 1.7–2.4)

## 2018-03-12 LAB — PHOSPHORUS: Phosphorus: 1.7 mg/dL — ABNORMAL LOW (ref 2.5–4.6)

## 2018-03-12 MED ORDER — TRAZODONE HCL 50 MG PO TABS: 50.0000 mg | ORAL_TABLET | Freq: Every day | ORAL | 1 refills | Status: DC

## 2018-03-12 NOTE — Telephone Encounter (Signed)
Gave patient AVs and calendar of upcoming June appointments. °

## 2018-03-13 ENCOUNTER — Telehealth (HOSPITAL_COMMUNITY): Payer: Self-pay

## 2018-03-13 ENCOUNTER — Encounter: Payer: Self-pay | Admitting: Cardiovascular Disease

## 2018-03-13 ENCOUNTER — Ambulatory Visit (INDEPENDENT_AMBULATORY_CARE_PROVIDER_SITE_OTHER): Payer: Medicare HMO | Admitting: Cardiovascular Disease

## 2018-03-13 VITALS — BP 110/66 | HR 70 | Ht 68.0 in | Wt 130.8 lb

## 2018-03-13 DIAGNOSIS — R0609 Other forms of dyspnea: Secondary | ICD-10-CM

## 2018-03-13 DIAGNOSIS — E7800 Pure hypercholesterolemia, unspecified: Secondary | ICD-10-CM

## 2018-03-13 DIAGNOSIS — I10 Essential (primary) hypertension: Secondary | ICD-10-CM

## 2018-03-13 DIAGNOSIS — E785 Hyperlipidemia, unspecified: Secondary | ICD-10-CM | POA: Insufficient documentation

## 2018-03-13 NOTE — Assessment & Plan Note (Signed)
History of essential hypertension her blood pressure measured this morning 110/66.  She is on Ziac.  Continue current meds at current dosing.

## 2018-03-13 NOTE — Telephone Encounter (Signed)
Encounter complete. 

## 2018-03-13 NOTE — Assessment & Plan Note (Signed)
Ms. Patalano was referred for new onset dyspnea which occurs almost reproducibly 6 days after chemotherapy which she is receiving for her non-Hodgkin's lymphoma.  She does have risk factors including treated hypertension and hyperlipidemia.  She is not diabetic.  She never smoked.  A recent 2D echo performed 01/24/2018 revealed vigorous LV systolic function with an EF of 65 to 70% mild AI.  I am going to get exercise Myoview stress test to rule out an ischemic etiology although it sounds like a reaction to chemotherapy.  If this is normal, I will see her back as needed.

## 2018-03-13 NOTE — Patient Instructions (Signed)
Medication Instructions: Your physician recommends that you continue on your current medications as directed. Please refer to the Current Medication list given to you today.   Testing/Procedures: Your physician has requested that you have en exercise stress myoview. For further information please visit [email or web address]. Please follow instruction sheet, as given.  Follow-Up: Your physician recommends that you schedule a follow-up appointment as needed. We will call you with your results.

## 2018-03-13 NOTE — Progress Notes (Signed)
03/13/2018 [name]   [birth year]  [id number]  Primary Physician Celene Squibb, MD Primary Cardiologist: Lorretta Harp MD Alicia [name], [place]  HPI:  Alicia [name] is a 77 y.o. thin appearing widowed Caucasian female mother of 2, grandmother 33 grandchildren who is accompanied by her son Alicia [name] today.  She was referred by her hematologist oncologist for new onset dyspnea on exertion.  Her cardiac risk factor profile is notable for treated hypertension and hyperlipidemia.  She is not diabetic.  She is never smoked and there is no family history.  She is never had a heart attack or stroke and denies chest pain.  She was diagnosed with non-Hodgkin's lymphoma in January of this year and began chemotherapy in March.  She is had 4 rounds of chemotherapy and is reproducibly noticed shortness of breath 6 days after being chemo starting with the second round.   Current Meds  Medication Sig  . acetaminophen (TYLENOL) 500 MG tablet Take 500 mg by mouth at bedtime as needed for moderate pain.   . bisoprolol-hydrochlorothiazide (ZIAC) 5-6.25 MG tablet Take 1 tablet by mouth 2 (two) times daily.  . ergocalciferol (VITAMIN D2) 50000 units capsule Take 1 capsule (50,000 Units total) by mouth 2 (two) times a week.  . lidocaine-prilocaine (EMLA) cream Apply to affected area once  . LORazepam (ATIVAN) 0.5 MG tablet Take 1 tablet (0.5 mg total) by mouth every 6 (six) hours as needed (Nausea or vomiting).  . Melatonin 10 MG TABS Take 10 mg by mouth at bedtime.   . naproxen sodium (ALEVE) 220 MG tablet Take 220 mg by mouth daily as needed (pain).  Marland Kitchen omega-3 acid ethyl esters (LOVAZA) 1 g capsule Take 1 g by mouth daily.   Marland Kitchen senna-docusate (SENNA S) 8.6-50 MG tablet Take 2 tablets by mouth 2 (two) times daily. May reduce to 2 tab po HS once bowel movements established.  . simvastatin (ZOCOR) 10 MG tablet Take 10 mg by mouth daily.  . traZODone (DESYREL) 50 MG tablet Take 1-2 tablets (50-100 mg  total) by mouth at bedtime.     No Known Allergies  Social History   Socioeconomic History  . Marital status: Widowed    Spouse name: Not on file  . Number of children: Not on file  . Years of education: Not on file  . Highest education level: Not on file  Occupational History  . Not on file  Social Needs  . Financial resource strain: Not on file  . Food insecurity:    Worry: Not on file    Inability: Not on file  . Transportation needs:    Medical: Not on file    Non-medical: Not on file  Tobacco Use  . Smoking status: Never Smoker  . Smokeless tobacco: Never Used  Substance and Sexual Activity  . Alcohol use: No  . Drug use: No  . Sexual activity: Not on file  Lifestyle  . Physical activity:    Days per week: Not on file    Minutes per session: Not on file  . Stress: Not on file  Relationships  . Social connections:    Talks on phone: Not on file    Gets together: Not on file    Attends religious service: Not on file    Active member of club or organization: Not on file    Attends meetings of clubs or organizations: Not on file    Relationship status: Not on file  .  Intimate partner violence:    Fear of current or ex partner: Not on file    Emotionally abused: Not on file    Physically abused: Not on file    Forced sexual activity: Not on file  Other Topics Concern  . Not on file  Social History Narrative  . Not on file     Review of Systems: General: negative for chills, fever, night sweats or weight changes.  Cardiovascular: negative for chest pain, dyspnea on exertion, edema, orthopnea, palpitations, paroxysmal nocturnal dyspnea or shortness of breath Dermatological: negative for rash Respiratory: negative for cough or wheezing Urologic: negative for hematuria Abdominal: negative for nausea, vomiting, diarrhea, bright red blood per rectum, melena, or hematemesis Neurologic: negative for visual changes, syncope, or dizziness All other systems reviewed  and are otherwise negative except as noted above.    Blood pressure 110/66, pulse 70, height 5\' 8"  (1.727 m), weight 130 lb 12.8 oz (59.3 kg).  General appearance: alert and no distress Neck: no adenopathy, no carotid bruit, no JVD, supple, symmetrical, trachea midline and thyroid not enlarged, symmetric, no tenderness/mass/nodules Lungs: clear to auscultation bilaterally Heart: regular rate and rhythm, S1, S2 normal, no murmur, click, rub or gallop Extremities: extremities normal, atraumatic, no cyanosis or edema Pulses: 2+ and symmetric Skin: Skin color, texture, turgor normal. No rashes or lesions Neurologic: Alert and oriented X 3, normal strength and tone. Normal symmetric reflexes. Normal coordination and gait  EKG normal sinus rhythm at 70 without ST or T wave changes.  I personally reviewed this EKG  ASSESSMENT AND PLAN:   HTN (hypertension) History of essential hypertension her blood pressure measured this morning 110/66.  She is on Ziac.  Continue current meds at current dosing.  Hyperlipidemia History of hyperlipidemia on statin therapy  Dyspnea Ms. Lantier was referred for new onset dyspnea which occurs almost reproducibly 6 days after chemotherapy which she is receiving for her non-Hodgkin's lymphoma.  She does have risk factors including treated hypertension and hyperlipidemia.  She is not diabetic.  She never smoked.  A recent 2D echo performed 01/24/2018 revealed vigorous LV systolic function with an EF of 65 to 70% mild AI.  I am going to get exercise Myoview stress test to rule out an ischemic etiology although it sounds like a reaction to chemotherapy.  If this is normal, I will see her back as needed.      Lorretta Harp MD FACP,FACC,FAHA, [facility] 03/13/2018 8:38 AM

## 2018-03-13 NOTE — Addendum Note (Signed)
Addended by: Venetia Maxon on: 03/13/2018 10:34 AM   Modules accepted: Orders

## 2018-03-13 NOTE — Assessment & Plan Note (Signed)
History of hyperlipidemia on statin therapy. 

## 2018-03-14 ENCOUNTER — Encounter: Payer: Self-pay | Admitting: Internal Medicine

## 2018-03-14 ENCOUNTER — Telehealth (HOSPITAL_COMMUNITY): Payer: Self-pay

## 2018-03-14 ENCOUNTER — Ambulatory Visit (INDEPENDENT_AMBULATORY_CARE_PROVIDER_SITE_OTHER): Payer: Medicare HMO | Admitting: Internal Medicine

## 2018-03-14 ENCOUNTER — Telehealth: Payer: Self-pay | Admitting: Internal Medicine

## 2018-03-14 VITALS — BP 128/82 | HR 72 | Ht 66.0 in | Wt 131.2 lb

## 2018-03-14 DIAGNOSIS — T451X5A Adverse effect of antineoplastic and immunosuppressive drugs, initial encounter: Secondary | ICD-10-CM

## 2018-03-14 DIAGNOSIS — R0609 Other forms of dyspnea: Secondary | ICD-10-CM

## 2018-03-14 LAB — NITRIC OXIDE: Nitric Oxide: 11

## 2018-03-14 NOTE — Progress Notes (Signed)
Subjective:    Patient ID: Alicia [name], female    DOB: 02/24/1941, 77 y.o.   MRN: [id number]  PCP Celene Squibb, MD   HPI  IOV 03/14/2018  Chief Complaint  Patient presents with  . Consult    Has been on chemo for non hodgkins lymphoma since March 15th of 2019. She has been sent ER twice due to SOB.   Alicia [name] 22-Apr-1941 female [address] [place] [address] -has been referred for episodic dyspnea.  She is accompanied by her son.  History is gained from talking to her and review of the oncology records.  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  She has been started on all-CHOP chemotherapy.  Status post 4 cycles at this point.  First cycle was given Friday December 28, 2017.  Cycles are every 3 weeks.  According to her and her son after the first cycle there was no side effects.  Then beginning the second cycle which presumably was given 01/24/18,  exactly on the 6 days she started developing shortness of breath.  The sixth day also corresponded to 3 days after subcutaneous injection NEULESTA to improve her white count.This  resulted in inpatient admission January 24, 2018 for 1 day.  Was associated with very high bP per hx and  severe hypophosphatemia < 1.0 and with correction of phosphorus her symptoms improved. Then 3rd cycle was on 02/08/18 with neulesta on 02/11/18 per chart rview. Then on 02/14/18 (6 days after 3rd cycle, 3 days after neulesta) she ended up in ER . Her phos was slightly low 2.2 and K3.2 . These were corrected and she was discharged.  Saw Dr Irene Limbo oncology on 02/28/18 - high dose Vit D started because of inability to retain phosphorus. .  Cards referral placed. BP control monitored. Neulesta changed to Granix/Neupogen for schedule 5/17, 5/20, 5/21, 5/22 Forestine Na.  Then after the third cycle this was changed to Granix but despite this exactly on the sixth day after chemo she gets short of breath.  Initially the shortness of  breath episodes were brief and transitory but with progressive cycles the shortness of breath has become more intense and more prolonged lasting few to several days.   Subsequently Feb 14, 2018 she had an ER visit for low phosphorus.  At this point in time she is completed 4 cycles of the chemotherapy (not sure date of Cycle #4 but was due 03/08/18) and she had the most intense symptoms after the fourth cycle but decided against going to the emergency department.  Her next fifth cycle is due next week but her oncologist Dr. Carolyne Fiscal has put it on hold pending cardiac and pulmonary work-up.     Review of the labs show that as of 2 days ago she still had low phosphorus.  It appears the low phosphorus correlates with nadir white count but in phone cal wih Dr Irene Limbo 03/15/2018 -t his is not always consistent.  This is documented below.  She did have pulmonary function test Mar 08, 2018 that I personally visualized and this corresponds to normal PFT except for isolated reduction diffusion capacity to 48% even accounting for anemia correction hemoglobin was around 11 g% that day.Marland Kitchen  However walk test today was normal.  She had CT angiogram PE protocol January 14, 2018 this to rule out pulmonary embolism.  She has bibasal atelectasis that could potentially account for the lower diffusion but the CT chest was  much earlier time point.  Her most recent chest x-ray Feb 14, 2018 look clear to me.  All images personally visualized by me  Exam nitric oxide today is 11 ppb and effectively rules out eosinophilic asthma.  She has upcoming cardiac stress test tomorrow but echocardiogram recently March 2019 was satisfactory and she has been reassured by Dr. Alvester Chou that probability for coronary ischemia is very low.   Also according to the patient she spoke to the oncology pharmacist at [facility] and was told that most likely this was some red chemotherapy injection causing her problems.  She does not believe this to be  Rituxan.  Simple office walk 185 feet x  3 laps goal with forehead probe 03/14/2018   O2 used Room air  Number laps completed 3  Comments about pace moderate  Resting Pulse Ox/HR 98% and 67/min  Final Pulse Ox/HR 98% and 78/min  Desaturated </= 88% no  Desaturated <= 3% points no  Got Tachycardic >/= 90/min no  Symptoms at end of test Mild dyspnea but continues  Miscellaneous comments      Review of Systems  Constitutional: Positive for activity change.  HENT: Negative.   Eyes: Negative.   Respiratory: Positive for shortness of breath.   Cardiovascular: Negative.   Gastrointestinal: Negative.   Endocrine: Negative.   Musculoskeletal: Negative.   Skin: Negative.   Allergic/Immunologic: Positive for immunocompromised state.  Neurological: Negative.   Hematological: Negative.   Psychiatric/Behavioral: Negative.    Results for ALEYA, DURNELL (MRN [id number]) as of 03/14/2018 10:04  Ref. Range 01/24/2018 08:35 01/25/2018 03:38 01/28/2018 12:34 02/08/2018 08:04 02/14/2018 09:14 02/28/2018 10:30 03/12/2018 08:16  Phosphorus Latest Ref Range: 2.5 - 4.6 mg/dL <1.0 (LL) 3.7 2.3 (L) 3.9 2.2 (L) 3.7 1.7 (L)  Results for OMELIA, MARQUART (MRN [id number]) as of 03/14/2018 10:04  Ref. Range 11/08/2017 11:16 11/27/2017 09:06 12/18/2017 07:46 12/28/2017 08:04 01/03/2018 12:45 01/18/2018 09:10 01/24/2018 08:35 01/25/2018 03:38 01/25/2018 12:14 01/28/2018 12:34 02/08/2018 08:04 02/14/2018 09:14 02/28/2018 08:24 03/12/2018 08:16  WBC Latest Ref Range: 3.9 - 10.3 K/uL 7.4 6.4 5.3 5.0 5.8 11.1 (H) 8.8 1.8 (L) 1.1 (LL) 15.2 (H) 9.7 10.6 (H) 12.6 (H) 2.3 (L)     has a past medical history of Arthritis, GERD (gastroesophageal reflux disease), Hypercholesterolemia, Hypertension, and Non Hodgkin's lymphoma ([place]).   reports that she has never smoked. She has never used smokeless tobacco.  Past Surgical History:  Procedure Laterality Date  . LYMPH NODE BIOPSY Left 11/27/2017   Procedure: LEFT INGUINAL LYMPH NODE BIOPSY;   Surgeon: Stark Klein, MD;  Location: [facility];  Service: General;  Laterality: Left;  . PORTACATH PLACEMENT Left 12/12/2017   Procedure: INSERTION PORT-A-CATH;  Surgeon: Stark Klein, MD;  Location: [facility];  Service: General;  Laterality: Left;  . TONSILLECTOMY      No Known Allergies   There is no immunization history on file for this patient.  No family history on file.   Current Outpatient Medications:  .  acetaminophen (TYLENOL) 500 MG tablet, Take 500 mg by mouth at bedtime as needed for moderate pain. , Disp: , Rfl:  .  bisoprolol-hydrochlorothiazide (ZIAC) 5-6.25 MG tablet, Take 1 tablet by mouth 2 (two) times daily., Disp: , Rfl:  .  ergocalciferol (VITAMIN D2) 50000 units capsule, Take 1 capsule (50,000 Units total) by mouth 2 (two) times a week., Disp: 24 capsule, Rfl: 1 .  hydrOXYzine (ATARAX/VISTARIL) 25 MG tablet, Take 1 tablet (25 mg total) by mouth at  bedtime as needed and may repeat dose one time if needed (insomnia)., Disp: 60 tablet, Rfl: 1 .  lidocaine-prilocaine (EMLA) cream, Apply to affected area once, Disp: 30 g, Rfl: 3 .  LORazepam (ATIVAN) 0.5 MG tablet, Take 1 tablet (0.5 mg total) by mouth every 6 (six) hours as needed (Nausea or vomiting)., Disp: 30 tablet, Rfl: 0 .  Melatonin 10 MG TABS, Take 10 mg by mouth at bedtime. , Disp: , Rfl:  .  naproxen sodium (ALEVE) 220 MG tablet, Take 220 mg by mouth daily as needed (pain)., Disp: , Rfl:  .  omega-3 acid ethyl esters (LOVAZA) 1 g capsule, Take 1 g by mouth daily. , Disp: , Rfl:  .  predniSONE (DELTASONE) 20 MG tablet, Take 3 tablets (60 mg total) by mouth daily. Take on days 1-5 of chemotherapy., Disp: 15 tablet, Rfl: 5 .  senna-docusate (SENNA S) 8.6-50 MG tablet, Take 2 tablets by mouth 2 (two) times daily. May reduce to 2 tab po HS once bowel movements established., Disp: 60 tablet, Rfl: 2 .  simvastatin (ZOCOR) 10 MG tablet, Take 10 mg by mouth daily., Disp: , Rfl:  .  traZODone (DESYREL) 50  MG tablet, Take 1-2 tablets (50-100 mg total) by mouth at bedtime., Disp: 60 tablet, Rfl: 1     Objective:   Physical Exam  Constitutional: She is oriented to person, place, and time. She appears well-developed and well-nourished. No distress.  Mildly deconditioned looking female  HENT:  Head: Normocephalic and atraumatic.  Right Ear: External ear normal.  Left Ear: External ear normal.  Mouth/Throat: Oropharynx is clear and moist. No oropharyngeal exudate.  Eyes: Pupils are equal, round, and reactive to light. Conjunctivae and EOM are normal. Right eye exhibits no discharge. Left eye exhibits no discharge. No scleral icterus.  Neck: Normal range of motion. Neck supple. No JVD present. No tracheal deviation present. No thyromegaly present.  Cardiovascular: Normal rate, regular rhythm, normal heart sounds and intact distal pulses. Exam reveals no gallop and no friction rub.  No murmur heard. Pulmonary/Chest: Effort normal and breath sounds normal. No respiratory distress. She has no wheezes. She has no rales. She exhibits no tenderness.  Possible basal crackles  Abdominal: Soft. Bowel sounds are normal. She exhibits no distension and no mass. There is no tenderness. There is no rebound and no guarding.  Musculoskeletal: Normal range of motion. She exhibits no edema or tenderness.  Lymphadenopathy:    She has no cervical adenopathy.  Neurological: She is alert and oriented to person, place, and time. She has normal reflexes. No cranial nerve deficit. She exhibits normal muscle tone. Coordination normal.  Skin: Skin is warm and dry. No rash noted. She is not diaphoretic. No erythema. No pallor.  Psychiatric: She has a normal mood and affect. Her behavior is normal. Judgment and thought content normal.  Vitals reviewed.    Vitals:   03/14/18 0938 03/14/18 0939  BP:  128/82  Pulse:  72  SpO2:  99%  Weight: 131 lb 3.2 oz (59.5 kg)   Height: 5\' 6"  (1.676 m)           Assessment &  Plan:     ICD-10-CM   1. Other form of dyspnea R06.09 Nitric oxide  2. Adverse effect of chemotherapy, initial encounter T45.1X5A    I am not sure this is specific pulmonary parenchymal or airway etiology.  This does not sound like Rituxan infusion reaction either although per Dr Irene Limbo patient can develop pneumnitis as  a result.  Multiple chemotherapeutic agent particularly platinum based therapies can cause pulmonary toxicity usually chronic in nature or subacute with ILD formation.  The episodic manifestation sounds more like an adverse drug reaction.  On the other hand it could be related to hypophosphatemia that seems to happen a week into her chemotherapy. Low DLCO is a bit perplexing - she did not desaturate as expected so the problem is mild or non-existent or could be atelectasis as seen a month earlier  After d/w Dr Irene Limbo 03/15/2018 - he will hold off further chemo cycle. Will keep mag and phos optimal. Encourged incentive spirometer daily and repeat CT chest in  A month.   Plan given to patient ,03/15/2018     Dr. Brand Males, M.D., [facility].C.P Pulmonary and Critical Care Medicine Staff Physician, [place] Director - Interstitial Lung Disease  Program  Pulmonary [place] at [place], [place], [address]  Pager: [phone], If no answer or between  15:00h - 7:00h: call [phone] Telephone: [phone]

## 2018-03-14 NOTE — Telephone Encounter (Signed)
Encounter complete. 

## 2018-03-14 NOTE — Patient Instructions (Addendum)
ICD-10-CM   1. Other form of dyspnea R06.09   2. Adverse effect of chemotherapy, initial encounter T45.1X5A     This sounds like chemotherapy adversed reaction Might be linked to low phosphorus - seein 01/24/18 and again 03/12/18 - both times white count also low  Plan d0 feno test Will talk to dr Irene Limbo and get back to you by end of day 03/15/18

## 2018-03-14 NOTE — Telephone Encounter (Signed)
Dr Irene Limbo  Please call me at your convenience p pager [phone] This is regarding Alicia [name]   Thanks  Dr. Brand Males, M.D., [facility].C.P Pulmonary and Critical Care Medicine Staff Physician, [place] Director - Interstitial Lung Disease  Program  Pulmonary [place] at [place], [place], [address]  Pager: [phone], If no answer or between  15:00h - 7:00h: call [phone] Telephone: [phone]

## 2018-03-15 ENCOUNTER — Encounter: Payer: Self-pay | Admitting: Internal Medicine

## 2018-03-15 ENCOUNTER — Ambulatory Visit (HOSPITAL_COMMUNITY)
Admission: RE | Admit: 2018-03-15 | Discharge: 2018-03-15 | Disposition: A | Discharge status: Home / Self Care | Payer: Medicare HMO | Source: Ambulatory Visit | Attending: Cardiovascular Disease | Admitting: Cardiovascular Disease

## 2018-03-15 ENCOUNTER — Telehealth: Payer: Self-pay | Admitting: Internal Medicine

## 2018-03-15 DIAGNOSIS — R0609 Other forms of dyspnea: Secondary | ICD-10-CM | POA: Diagnosis not present

## 2018-03-15 DIAGNOSIS — R0602 Shortness of breath: Secondary | ICD-10-CM

## 2018-03-15 DIAGNOSIS — I10 Essential (primary) hypertension: Secondary | ICD-10-CM | POA: Diagnosis not present

## 2018-03-15 DIAGNOSIS — E785 Hyperlipidemia, unspecified: Secondary | ICD-10-CM | POA: Diagnosis not present

## 2018-03-15 DIAGNOSIS — C8590 Non-Hodgkin lymphoma, unspecified, unspecified site: Secondary | ICD-10-CM | POA: Insufficient documentation

## 2018-03-15 LAB — MYOCARDIAL PERFUSION IMAGING
CHL CUP NUCLEAR SSS: 2
CHL RATE OF PERCEIVED EXERTION: 17
CSEPEW: 7 METS
Exercise duration (min): 6 min
Exercise duration (sec): 0 s
LV dias vol: 75 mL (ref 46–106)
LVSYSVOL: 21 mL
MPHR: 143 {beats}/min
NUC STRESS TID: 0.85
Peak HR: 150 {beats}/min
Percent HR: 104 %
Rest HR: 58 {beats}/min
SDS: 0
SRS: 2

## 2018-03-15 MED ORDER — POTASSIUM PHOSPHATE MONOBASIC 500 MG PO TABS: 500.0000 mg | ORAL_TABLET | Freq: Two times a day (BID) | ORAL | 1 refills | Status: DC

## 2018-03-15 MED ORDER — TECHNETIUM TC 99M TETROFOSMIN IV KIT
10.1000 | PACK | Freq: Once | INTRAVENOUS | Status: AC | PRN
  Administered 2018-03-15: 10.1 via INTRAVENOUS
  Filled 2018-03-15: qty 11

## 2018-03-15 MED ORDER — TECHNETIUM TC 99M TETROFOSMIN IV KIT
31.6000 | PACK | Freq: Once | INTRAVENOUS | Status: AC | PRN
  Administered 2018-03-15: 31.6 via INTRAVENOUS
  Filled 2018-03-15: qty 32

## 2018-03-15 MED ORDER — MAGNESIUM 400 MG PO TABS: 1.0000 | ORAL_TABLET | Freq: Every day | ORAL | 1 refills | Status: DC

## 2018-03-15 NOTE — Telephone Encounter (Signed)
Dyspnea discussed - feel is multifactorial - chemo, low phos   1. K Phos increase tablet from 500mg  once daily to 500mg  twice daily - please send  2. Start magnesium oxide 400mg  once daily - please start 3. PFT showed low diffisuon 03/08/18 - 48% -> But CT 01/24/18 showed only atlectasis -> recommended - incentive spirometere 5 times every hour x 10 times a day in the awake hour - please start 4. Dr Irene Limbo will hold chemo for now  Plan- - return to pulmonary to see MR with HRCT in 1 month - please order    Dr. Brand Males, M.D., [facility].C.P Pulmonary and Critical Care Medicine Staff Physician, [place] Director - Interstitial Lung Disease  Program  Pulmonary [place] at [place], [place], [address]  Pager: [phone], If no answer or between  15:00h - 7:00h: call [phone] Telephone: [phone]

## 2018-03-15 NOTE — Telephone Encounter (Signed)
I called and spoke with the pt's daughter, Adonis Brook  She states that MR called and spoke with the pt  I let her know that I am sending order for K Phos, Mag, HRCT in 1 mo with f/u, and also incentive spirometer  She verbalized understanding  Nothing further needed at this time

## 2018-03-15 NOTE — Telephone Encounter (Signed)
LMTCB for [employer]

## 2018-03-15 NOTE — Telephone Encounter (Signed)
Spoke with Aldona Bar, Dr Grier Mitts nurse  She states that MR just spoke with Dr. Irene Limbo regarding this pt  MR- have you spoken with pt's daughter?

## 2018-03-15 NOTE — Telephone Encounter (Signed)
Spoke with patient's daughter Altha Harm. She stated that she was seen by MR yesterday and was advised that he would contact her oncologist and then contact her. She was following up on this.   Patient can be reached directly at [phone].   MR, please advise if this has been taken care of already. Thanks!

## 2018-03-15 NOTE — Telephone Encounter (Signed)
I have contracted Dr Irene Limbo via phone message and email. Btu he is probably busy and has not had time to turn around. Can you pleae call oncology clinic and have him reach out to me on my cell. That would be  Helpful  Thanks  Dr. Brand Males, M.D., [facility].C.P Pulmonary and Critical Care Medicine Staff Physician, [place] Director - Interstitial Lung Disease  Program  Pulmonary [place] at [place], [place], [address]  Pager: [phone], If no answer or between  15:00h - 7:00h: call [phone] Telephone: [phone]

## 2018-03-18 ENCOUNTER — Other Ambulatory Visit: Payer: Self-pay | Admitting: Hematology

## 2018-03-18 DIAGNOSIS — R69 Illness, unspecified: Secondary | ICD-10-CM | POA: Diagnosis not present

## 2018-03-18 MED ORDER — PHOSPHA 250 NEUTRAL 155-852-130 MG PO TABS: 2.0000 | ORAL_TABLET | Freq: Every day | ORAL | 0 refills | Status: DC

## 2018-03-20 ENCOUNTER — Ambulatory Visit: Payer: Medicare HMO | Admitting: Hematology

## 2018-03-20 ENCOUNTER — Telehealth: Payer: Self-pay

## 2018-03-20 ENCOUNTER — Other Ambulatory Visit: Payer: Medicare HMO

## 2018-03-20 NOTE — Telephone Encounter (Signed)
Pt called this morning requesting medication clarification on Kphos and Phospha prescribed by different physicians. Discussed with Dr. Irene Limbo. Pt okay to take Kphos 500mg  bid as prescribed by Dr. Chase Caller and hold Phospha while on this medication. Pt okay to proceed with Magnesium 400mg  daily. Pt verbalized understanding and thanks for the clarification.

## 2018-03-21 ENCOUNTER — Ambulatory Visit: Payer: Medicare HMO

## 2018-03-22 ENCOUNTER — Ambulatory Visit (HOSPITAL_COMMUNITY): Payer: Medicare HMO

## 2018-03-25 ENCOUNTER — Ambulatory Visit (HOSPITAL_COMMUNITY): Payer: Medicare HMO

## 2018-03-26 ENCOUNTER — Ambulatory Visit (HOSPITAL_COMMUNITY): Payer: Medicare HMO

## 2018-03-27 ENCOUNTER — Ambulatory Visit (HOSPITAL_COMMUNITY): Payer: Medicare HMO

## 2018-04-09 NOTE — Progress Notes (Signed)
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 female who has been referred to [place] by Dr. Allyn Kenner for evaluation and management of enlarged lymph node to left inguinal area.   She is accompanied by her daughter today. She notes that she is doing well overall. She notes that she initially presented with a palpable lump to her left inguinal area for approximately 3-4 weeks. She denies pain to the area, but notes that the area feels larger today. She reports that she typically goes to the doctor twice a year for a medication refill.   She had an [place] on 10/19/17 significant for: IMPRESSION: Multiple prominent masses in the left groin with the largest measuring up to 4.4 cm. These are most likely lymph nodes.   Subsequently, she had a CT AP with contrast completed on 10/26/17 with results of: IMPRESSION: CT evidence of lymphoma, with bulky lymph nodes in multiple nodal stations of the abdomen and pelvis, predominantly left inguinal, left iliac, and the para-aortic nodes. 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   She has a PMHx of HTN and high cholesterol which she takes medications for. She denies a PMHx of DM, cardiac issues, abdominal issues, lung issues, surgeries, or major injuries. She denies smoking cigarettes, drinking ETOH, or chemical exposure.  Her father had a hx of kidney cancer and her mother with lung cancer. Her mother did smoke cigarettes. She denies any other family hx of blood disorders or blood cancers. She has two siblings whom have had either a kidney removal or on dialysis respectively. 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    On review of systems, she reports a slower urinary stream, urinary urgency x several years, resolved diarrhea x yesterday, chronic left hip pain x several years, and chronic right shoulder pain (previously diagnosed with bursitis). She denies her urgency being urgent to need an evaluation. She attributes the diarrhea to something that she ate yesterday. She notes a prior hx of infection to her great toe which she self-treated with Listerine soaks and OTC antifungal creams. She denies abdominal pain, vaginal discharge, fever, chills, night sweats, unexpected weight loss, leg swelling, bowel issues, urinary frequency, vaginal bleeding, foot/toe infection, abdominal trauma, bowel habit changes, prior hx of diverticulitis, lumps anywhere else on the body, and any other symptoms.   INTERVAL HISTORY   Alicia [name] is here for a follow-up and continued management of her high grade follicular lymphadenopathy and evaluation after completing R-CHOP after 4 cycles. The patient's last visit with [place] was on 03/12/18. She is accompanied today by her daughter. The pt reports that she is doing well overall.   The pt reports that her SOB has improved after completing chemotherapy. Interestingly, she adds that she feels SOB after splitting wood. She describes herself as feeling 90% better and feels that she has returned to her normal life activities. She had a stress test in the interim that she notes did not reveal anything concerning. She notes that the bottom of her feet have felt a little numb and denies this worsening.   She is not currently taking extra Vitamin D replacement. She  has been taking 2 KPhos each day.   Lab results today (04/10/18) of CBC, CMP is as follows: all values are WNL except for Creatinine at 1.02, Total Protein at 6.4, GFR at 51. LDH 04/10/18 is elevated at 240  On review of systems, pt reports eating well, good energy levels, resolved SOB, good activity levels, and  denies noticing any new lumps or bumps, difficulty urinating, bowel movement changes, leg swelling, and any other symptoms.    MEDICAL HISTORY:  Past Medical History:  Diagnosis Date  . Arthritis    back and hips  . GERD (gastroesophageal reflux disease)    TUMS  . Hypercholesterolemia   . Hypertension   . Non Hodgkin's lymphoma ([place])     SURGICAL HISTORY: Past Surgical History:  Procedure Laterality Date  . LYMPH NODE BIOPSY Left 11/27/2017   Procedure: LEFT INGUINAL LYMPH NODE BIOPSY;  Surgeon: Stark Klein, MD;  Location: [place];  Service: General;  Laterality: Left;  . PORTACATH PLACEMENT Left 12/12/2017   Procedure: INSERTION PORT-A-CATH;  Surgeon: Stark Klein, MD;  Location: [place];  Service: General;  Laterality: Left;  . TONSILLECTOMY      SOCIAL HISTORY: Social History   Socioeconomic History  . Marital status: Widowed    Spouse name: Not on file  . Number of children: Not on file  . Years of education: Not on file  . Highest education level: Not on file  Occupational History  . Not on file  Social Needs  . Financial resource strain: Not on file  . Food insecurity:    Worry: Not on file    Inability: Not on file  . Transportation needs:    Medical: Not on file    Non-medical: Not on file  Tobacco Use  . Smoking status: Never Smoker  . Smokeless tobacco: Never Used  Substance and Sexual Activity  . Alcohol use: No  . Drug use: No  . Sexual activity: Not on file  Lifestyle  . Physical activity:    Days per week: Not on file    Minutes per session: Not on file  . Stress: Not on file  Relationships  . Social connections:    Talks on phone: Not on file    Gets together: Not on file    Attends religious service: Not on file    Active member of club or organization: Not on file    Attends meetings of clubs or organizations: Not on file    Relationship status: Not on file  . Intimate partner violence:    Fear of current or ex  partner: Not on file    Emotionally abused: Not on file    Physically abused: Not on file    Forced sexual activity: Not on file  Other Topics Concern  . Not on file  Social History Narrative  . Not on file    FAMILY HISTORY: No family history on file.  ALLERGIES:  has No Known Allergies.  MEDICATIONS:  Current Outpatient Medications  Medication Sig Dispense Refill  . acetaminophen (TYLENOL) 500 MG tablet Take 500 mg by mouth at bedtime as needed for moderate pain.     . bisoprolol-hydrochlorothiazide (ZIAC) 5-6.25 MG tablet Take 1 tablet by mouth 2 (two) times daily.    . ergocalciferol (VITAMIN D2) 50000 units capsule Take 1 capsule (50,000 Units total) by mouth 2 (two) times a week. 24 capsule 1  . hydrOXYzine (ATARAX/VISTARIL) 25 MG tablet Take 1 tablet (25 mg total) by  mouth at bedtime as needed and may repeat dose one time if needed (insomnia). 60 tablet 1  . K Phos Mono-Sod Phos Di & Mono (PHOSPHA 250 NEUTRAL) 155-852-130 MG TABS Take 2 tablets by mouth daily. 60 each 0  . lidocaine-prilocaine (EMLA) cream Apply to affected area once 30 g 3  . LORazepam (ATIVAN) 0.5 MG tablet Take 1 tablet (0.5 mg total) by mouth every 6 (six) hours as needed (Nausea or vomiting). 30 tablet 0  . Magnesium 400 MG TABS Take 1 tablet by mouth daily. 30 tablet 1  . Melatonin 10 MG TABS Take 10 mg by mouth at bedtime.     . naproxen sodium (ALEVE) 220 MG tablet Take 220 mg by mouth daily as needed (pain).    Marland Kitchen omega-3 acid ethyl esters (LOVAZA) 1 g capsule Take 1 g by mouth daily.     . potassium phosphate, monobasic, (K-PHOS ORIGINAL) 500 MG tablet Take 1 tablet (500 mg total) by mouth 2 (two) times daily with a meal. 60 tablet 1  . predniSONE (DELTASONE) 20 MG tablet Take 3 tablets (60 mg total) by mouth daily. Take on days 1-5 of chemotherapy. 15 tablet 5  . senna-docusate (SENNA S) 8.6-50 MG tablet Take 2 tablets by mouth 2 (two) times daily. May reduce to 2 tab po HS once bowel movements  established. 60 tablet 2  . simvastatin (ZOCOR) 10 MG tablet Take 10 mg by mouth daily.    . traZODone (DESYREL) 50 MG tablet Take 1-2 tablets (50-100 mg total) by mouth at bedtime. 60 tablet 1   No current facility-administered medications for this visit.     REVIEW OF SYSTEMS:    A 10+ POINT REVIEW OF SYSTEMS WAS OBTAINED including neurology, dermatology, psychiatry, cardiac, respiratory, lymph, extremities, GI, GU, Musculoskeletal, constitutional, breasts, reproductive, HEENT.  All pertinent positives are noted in the HPI.  All others are negative.   PHYSICAL EXAMINATION:  ECOG PERFORMANCE STATUS: 0 - Asymptomatic  . Vitals:   04/10/18 1331  BP: (!) 143/72  Pulse: 66  Resp: 18  Temp: 98.6 F (37 C)  SpO2: 99%   Filed Weights   04/10/18 1331  Weight: 131 lb 1.6 oz (59.5 kg)   .Body mass index is 19.93 kg/m.  GENERAL:alert, in no acute distress and comfortable SKIN: no acute rashes, no significant lesions EYES: conjunctiva are pink and non-injected, sclera anicteric OROPHARYNX: MMM, no exudates, no oropharyngeal erythema or ulceration NECK: supple, no JVD LYMPH:  no palpable lymphadenopathy in the cervical, axillary or inguinal regions LUNGS: clear to auscultation b/l with normal respiratory effort HEART: regular rate & rhythm ABDOMEN:  normoactive bowel sounds , non tender, not distended. Extremity: no pedal edema PSYCH: alert & oriented x 3 with fluent speech NEURO: no focal motor/sensory deficits   LABORATORY DATA:  I have reviewed the data as listed  . CBC Latest Ref Rng & Units 04/10/2018 03/12/2018 02/28/2018  WBC 3.9 - 10.3 K/uL 8.6 2.3(L) 12.6(H)  Hemoglobin 11.6 - 15.9 g/dL 12.6 11.1(L) 11.6  Hematocrit 34.8 - 46.6 % 37.9 34.1(L) 35.5  Platelets 145 - 400 K/uL 149 138(L) 220  ANC 1.3k . CBC    Component Value Date/Time   WBC 8.6 04/10/2018 1303   RBC 4.04 04/10/2018 1303   HGB 12.6 04/10/2018 1303   HGB 11.6 02/28/2018 0824   HCT 37.9 04/10/2018  1303   PLT 149 04/10/2018 1303   PLT 220 02/28/2018 0824   MCV 93.7 04/10/2018 1303   MCH 31.2  04/10/2018 1303   MCHC 33.3 04/10/2018 1303   RDW 14.4 04/10/2018 1303   LYMPHSABS 1.7 04/10/2018 1303   MONOABS 0.7 04/10/2018 1303   EOSABS 0.2 04/10/2018 1303   BASOSABS 0.1 04/10/2018 1303   . CMP Latest Ref Rng & Units 04/10/2018 03/12/2018 02/28/2018  Glucose 70 - 99 mg/dL 98 106 102  BUN 8 - 23 mg/dL 17 13 17   Creatinine 0.44 - 1.00 mg/dL 1.02(H) 0.90 1.11(H)  Sodium 135 - 145 mmol/L 142 141 141  Potassium 3.5 - 5.1 mmol/L 4.2 3.6 3.7  Chloride 98 - 111 mmol/L 106 108 108  CO2 22 - 32 mmol/L 28 24 27   Calcium 8.9 - 10.3 mg/dL 9.9 9.7 9.2  Total Protein 6.5 - 8.1 g/dL 6.4(L) 6.2(L) 6.1(L)  Total Bilirubin 0.3 - 1.2 mg/dL 0.4 0.3 0.5  Alkaline Phos 38 - 126 U/L 71 60 70  AST 15 - 41 U/L 30 18 21   ALT 0 - 44 U/L 25 17 15    . Lab Results  Component Value Date   LDH 240 (H) 04/10/2018   Component     Latest Ref Rng & Units 11/01/2017  Retic Ct Pct     0.7 - 2.1 % 1.2  RBC.     3.70 - 5.45 MIL/uL 5.18  Retic Count, Absolute     33.7 - 90.7 K/uL 62.2  Hep B Core Ab, Tot     Negative Negative  Hepatitis B Surface Ag     Negative Negative  HCV Ab     0.0 - 0.9 s/co ratio <0.1  HIV Screen 4th Generation wRfx     Non Reactive Non Reactive  Sed Rate     0 - 22 mm/hr 1    PATHOLOGY         :        PROCEDURES  ECHO 11/22/17 Study Conclusions - Left ventricle: The cavity size was normal. Septal wall thickness   was increased in a pattern of moderate LVH with otherwise mild   concentric hypertrophy. Systolic function was normal. The   estimated ejection fraction was in the range of 60% to 65%. Wall   motion was normal; there were no regional wall motion   abnormalities. Doppler parameters are consistent with abnormal   left ventricular relaxation (grade 1 diastolic dysfunction).   Doppler parameters are consistent with indeterminate ventricular   filling  pressure. - Aortic valve: Transvalvular velocity was within the normal range.   There was no stenosis. There was mild regurgitation. - Mitral valve: Transvalvular velocity was within the normal range.   There was no evidence for stenosis. There was no regurgitation. - Right ventricle: The cavity size was normal. Wall thickness was   normal. Systolic function was normal. - Tricuspid valve: There was mild regurgitation. - Pulmonary arteries: Systolic pressure was within the normal   range. PA peak pressure: 27 mm Hg (S). - Global longitudinal strain -17.7% (normal)    RADIOGRAPHIC STUDIES: I have personally reviewed the radiological images as listed and agreed with the findings in the report. No results found.  ASSESSMENT & PLAN:  77 y.o. is a female with prior hx of HTN and high cholesterol    1. Recently Diagnosed High grade Follicular lymphoma 2. Significant intra-abdominal generalized lymphadenopathy from high grade FL - resolved  11/08/17 and 11/27/17 LN Biopsy confirms Non-Hodgkin's B Cell Lymphoma -follicular lymphoma High Grade (grade 3) HIV non reactive No symptoms suggest an intra-abd /pelvic inflammatory process at this time.  Baseline LDH levels normal at 200 (11/01/17)  Port placed on 12/12/17 [facility] with nl EF  Component     Latest Ref Rng & Units 11/01/2017  Hep B Core Ab, Tot     Negative Negative  Hepatitis B Surface Ag     Negative Negative  HCV Ab     0.0 - 0.9 s/co ratio <0.1  HIV Screen 4th Generation wRfx     Non Reactive Non Reactive    PET scan from 11/19/17 - with enlarged active lymph nodes primarily in her abdomen and small lymph nodes in her chest and neck, evidence of stage IIIA disease.   PET/CT on 02/25/2018 after 3 cycles of R-CHOP showed  Interval response to therapy with mild residual hypermetabolism  in both hilar regions.  PLAN -Discussed pt labwork today, 04/10/18; blood counts have completely normalized, LDH decreased to 240 -Continue on  Vitamin D replacement  -Discussed the possible benefits of maintenance Rituxan every 2 months - pt would like to pursue this -Repeat CT chest /abd/pelvis in 5 months  -Offered port removal - pt would like to wait until repeat imaging. -Will begin maintenance Rituxan every 2 months -referral to pulmonary for dyspnea evaluation especially given PFTs show mod diffusion defect. Seeing Dr Chase Caller -- planning for HRCT chest. -seen by cardiology and had stress testing -- reports was WNL   3. HTN -She is on HTN medication PLAN  -mx per PCP   Start maintenance Rituxan q60days from mid July 2019 -- plz schedule 3 doses RTC with Dr Irene Limbo in about 2.5 months with C2 Maintenance Rituxan with labs    All of the patients questions were answered with apparent satisfaction. The patient knows to call the clinic with any problems, questions or concerns.  The toal time spent in the appt was 25 minutes and more than 50% was on counseling and direct patient cares.    Sullivan Lone MD MS AAHIVMS [facility] Hematology/Oncology Physician [facility]  (Office):       [phone] (Work cell):  [phone] (Fax):           [phone]  04/10/2018 2:37 PM  I, Baldwin Jamaica, am acting as a Education administrator for Dr Irene Limbo.   .I have reviewed the above documentation for accuracy and completeness, and I agree with the above. Brunetta Genera MD

## 2018-04-10 ENCOUNTER — Inpatient Hospital Stay (HOSPITAL_BASED_OUTPATIENT_CLINIC_OR_DEPARTMENT_OTHER): Payer: Medicare HMO | Admitting: Hematology

## 2018-04-10 ENCOUNTER — Other Ambulatory Visit: Payer: Self-pay

## 2018-04-10 ENCOUNTER — Ambulatory Visit: Payer: Medicare HMO

## 2018-04-10 ENCOUNTER — Inpatient Hospital Stay: Payer: Medicare HMO | Attending: Hematology

## 2018-04-10 VITALS — BP 143/72 | HR 66 | Temp 98.6°F | Resp 18 | Ht 68.0 in | Wt 131.1 lb

## 2018-04-10 DIAGNOSIS — Z79899 Other long term (current) drug therapy: Secondary | ICD-10-CM | POA: Insufficient documentation

## 2018-04-10 DIAGNOSIS — C8285 Other types of follicular lymphoma, lymph nodes of inguinal region and lower limb: Secondary | ICD-10-CM | POA: Insufficient documentation

## 2018-04-10 DIAGNOSIS — C8238 Follicular lymphoma grade IIIa, lymph nodes of multiple sites: Secondary | ICD-10-CM

## 2018-04-10 DIAGNOSIS — I10 Essential (primary) hypertension: Secondary | ICD-10-CM

## 2018-04-10 LAB — CBC WITH DIFFERENTIAL/PLATELET
Basophils Absolute: 0.1 10*3/uL (ref 0.0–0.1)
Basophils Relative: 1 %
Eosinophils Absolute: 0.2 10*3/uL (ref 0.0–0.5)
Eosinophils Relative: 2 %
HCT: 37.9 % (ref 34.8–46.6)
Hemoglobin: 12.6 g/dL (ref 11.6–15.9)
Lymphocytes Relative: 19 %
Lymphs Abs: 1.7 10*3/uL (ref 0.9–3.3)
MCH: 31.2 pg (ref 25.1–34.0)
MCHC: 33.3 g/dL (ref 31.5–36.0)
MCV: 93.7 fL (ref 79.5–101.0)
Monocytes Absolute: 0.7 10*3/uL (ref 0.1–0.9)
Monocytes Relative: 8 %
Neutro Abs: 6 10*3/uL (ref 1.5–6.5)
Neutrophils Relative %: 70 %
Platelets: 149 10*3/uL (ref 145–400)
RBC: 4.04 MIL/uL (ref 3.70–5.45)
RDW: 14.4 % (ref 11.2–14.5)
WBC: 8.6 10*3/uL (ref 3.9–10.3)

## 2018-04-10 LAB — CMP (CANCER CENTER ONLY)
ALT: 25 U/L (ref 0–44)
AST: 30 U/L (ref 15–41)
Albumin: 4 g/dL (ref 3.5–5.0)
Alkaline Phosphatase: 71 U/L (ref 38–126)
Anion gap: 8 (ref 5–15)
BUN: 17 mg/dL (ref 8–23)
CO2: 28 mmol/L (ref 22–32)
Calcium: 9.9 mg/dL (ref 8.9–10.3)
Chloride: 106 mmol/L (ref 98–111)
Creatinine: 1.02 mg/dL — ABNORMAL HIGH (ref 0.44–1.00)
GFR, Est AFR Am: 59 mL/min — ABNORMAL LOW (ref >60)
GFR, Estimated: 51 mL/min — ABNORMAL LOW (ref >60)
Glucose, Bld: 98 mg/dL (ref 70–99)
Potassium: 4.2 mmol/L (ref 3.5–5.1)
Sodium: 142 mmol/L (ref 135–145)
Total Bilirubin: 0.4 mg/dL (ref 0.3–1.2)
Total Protein: 6.4 g/dL — ABNORMAL LOW (ref 6.5–8.1)

## 2018-04-10 LAB — LACTATE DEHYDROGENASE: LDH: 240 U/L — ABNORMAL HIGH (ref 98–192)

## 2018-04-11 ENCOUNTER — Ambulatory Visit: Payer: Medicare HMO | Admitting: Hematology

## 2018-04-11 ENCOUNTER — Other Ambulatory Visit: Payer: Medicare HMO

## 2018-04-11 ENCOUNTER — Ambulatory Visit: Payer: Medicare HMO

## 2018-04-12 ENCOUNTER — Telehealth: Payer: Self-pay

## 2018-04-12 NOTE — Telephone Encounter (Signed)
Called concerning appointment with Forestine Na. Per 6/28 phone que transferred call to [facility].

## 2018-04-12 NOTE — Telephone Encounter (Signed)
Received a call from patient with a request to have her port a cath flushed at [facility] since she lives in [place]. Appointment made for 10:00 Tuesday 04/16/18 at [place]. Spoke to Renda Rolls, Therapist, sports at [facility] to schedule appointment. Patient made aware of appointment.

## 2018-04-15 ENCOUNTER — Encounter (HOSPITAL_COMMUNITY)
Admission: RE | Admit: 2018-04-15 | Discharge: 2018-04-15 | Disposition: A | Discharge status: Home / Self Care | Payer: Medicare HMO | Source: Ambulatory Visit | Attending: Hematology | Admitting: Hematology

## 2018-04-15 ENCOUNTER — Ambulatory Visit (HOSPITAL_COMMUNITY)
Admission: RE | Admit: 2018-04-15 | Discharge: 2018-04-15 | Disposition: A | Discharge status: Home / Self Care | Payer: Medicare HMO | Source: Ambulatory Visit | Attending: Internal Medicine | Admitting: Internal Medicine

## 2018-04-15 ENCOUNTER — Telehealth: Payer: Self-pay

## 2018-04-15 DIAGNOSIS — Z452 Encounter for adjustment and management of vascular access device: Secondary | ICD-10-CM | POA: Diagnosis not present

## 2018-04-15 DIAGNOSIS — C8238 Follicular lymphoma grade IIIa, lymph nodes of multiple sites: Secondary | ICD-10-CM | POA: Insufficient documentation

## 2018-04-15 DIAGNOSIS — R918 Other nonspecific abnormal finding of lung field: Secondary | ICD-10-CM | POA: Diagnosis not present

## 2018-04-15 DIAGNOSIS — R0609 Other forms of dyspnea: Secondary | ICD-10-CM | POA: Insufficient documentation

## 2018-04-15 DIAGNOSIS — R0602 Shortness of breath: Secondary | ICD-10-CM | POA: Diagnosis not present

## 2018-04-15 MED ORDER — ALTEPLASE 2 MG IJ SOLR: 2.0000 mg | Freq: Once | INTRAMUSCULAR | Status: DC | PRN

## 2018-04-15 MED ORDER — HEPARIN SOD (PORK) LOCK FLUSH 100 UNIT/ML IV SOLN
500.0000 [IU] | INTRAVENOUS | Status: AC | PRN
  Administered 2018-04-15: 500 [IU]

## 2018-04-15 MED ORDER — SODIUM CHLORIDE 0.9% FLUSH
10.0000 mL | INTRAVENOUS | Status: AC | PRN
  Administered 2018-04-15: 10 mL

## 2018-04-15 NOTE — Telephone Encounter (Signed)
Orders for port flush faxed to [phone]. Confirmation of fax received.

## 2018-04-16 ENCOUNTER — Inpatient Hospital Stay (HOSPITAL_COMMUNITY): Admission: RE | Admit: 2018-04-16 | Payer: Medicare HMO | Source: Ambulatory Visit

## 2018-04-16 DIAGNOSIS — H524 Presbyopia: Secondary | ICD-10-CM | POA: Diagnosis not present

## 2018-04-22 ENCOUNTER — Other Ambulatory Visit: Payer: Self-pay

## 2018-04-26 ENCOUNTER — Other Ambulatory Visit: Payer: Self-pay | Admitting: Hematology

## 2018-04-26 ENCOUNTER — Telehealth: Payer: Self-pay

## 2018-04-26 DIAGNOSIS — C8238 Follicular lymphoma grade IIIa, lymph nodes of multiple sites: Secondary | ICD-10-CM

## 2018-04-26 NOTE — Telephone Encounter (Signed)
Patient wanted to make Dr. Irene Limbo aware that she has 2 new nickel sized knots under her right arm. Patient states knots are not painful and she feels fine. Dr. Irene Limbo made aware and stated he will evaluate patient at her visit on 05/01/18. Dr. Irene Limbo also stated he will get patient set up for transfer to [facility] per her request.

## 2018-04-26 NOTE — Telephone Encounter (Signed)
Received voicemail from patient to call her. Returned call to patient x2 with no answer. Left voicemail for patient to return call to Dr. Grier Mitts nurse at [phone].

## 2018-04-30 ENCOUNTER — Telehealth: Payer: Self-pay | Admitting: Medical Oncology

## 2018-04-30 NOTE — Telephone Encounter (Signed)
Rituxan appts.  Cycle 1 to be done at [facility] tomorrow. Nurse at [facility] needs more time to get authorization to tx at [facility] .  They will administer subsequent tx.

## 2018-04-30 NOTE — Progress Notes (Signed)
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 female who has been referred to [place] by Dr. Allyn Kenner for evaluation and management of enlarged lymph node to left inguinal area.   She is accompanied by her daughter today. She notes that she is doing well overall. She notes that she initially presented with a palpable lump to her left inguinal area for approximately 3-4 weeks. She denies pain to the area, but notes that the area feels larger today. She reports that she typically goes to the doctor twice a year for a medication refill.   She had an [place] on 10/19/17 significant for: IMPRESSION: Multiple prominent masses in the left groin with the largest measuring up to 4.4 cm. These are most likely lymph nodes.   Subsequently, she had a CT AP with contrast completed on 10/26/17 with results of: IMPRESSION: CT evidence of lymphoma, with bulky lymph nodes in multiple nodal stations of the abdomen and pelvis, predominantly left inguinal, left iliac, and the para-aortic nodes. 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   She has a PMHx of HTN and high cholesterol which she takes medications for. She denies a PMHx of DM, cardiac issues, abdominal issues, lung issues, surgeries, or major injuries. She denies smoking cigarettes, drinking ETOH, or chemical exposure.  Her father had a hx of kidney cancer and her mother with lung cancer. Her mother did smoke cigarettes. She denies any other family hx of blood disorders or blood cancers. She has two siblings whom have had either a kidney removal or on dialysis respectively. 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    On review of systems, she reports a slower urinary stream, urinary urgency x several years, resolved diarrhea x yesterday, chronic left hip pain x several years, and chronic right shoulder pain (previously diagnosed with bursitis). She denies her urgency being urgent to need an evaluation. She attributes the diarrhea to something that she ate yesterday. She notes a prior hx of infection to her great toe which she self-treated with Listerine soaks and OTC antifungal creams. She denies abdominal pain, vaginal discharge, fever, chills, night sweats, unexpected weight loss, leg swelling, bowel issues, urinary frequency, vaginal bleeding, foot/toe infection, abdominal trauma, bowel habit changes, prior hx of diverticulitis, lumps anywhere else on the body, and any other symptoms.   INTERVAL HISTORY   [name] is here for a follow-up and continued management of her high grade follicular lymphadenopathy and evaluation after completing R-CHOP after 4 cycles and to begin maintenance Rituxan. The patient's last visit with [place] was on 04/10/18. The pt reports that she is doing well overall.   The pt reports that she has had increasingly resolving SOB and continues to stay very active with good energy levels. 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   She reports noticing two small "puffy places" in her right armpit beneath the skin about 10 days ago. She denies feeling any nodularity to these. No palpable LNadenopathy on examination.  Of note since the patient's last visit, pt has had CT Chest  completed on 04/15/18 with results revealing Spectrum of findings suggestive of a basilar predominant fibrotic interstitial lung disease without frank honeycombing and without appreciable change since 01/24/2018 chest CT. Usual interstitial pneumonia (UIP) is possible, although fibrotic phase nonspecific interstitial pneumonia (NSIP) is  favored. Follow-up high-resolution chest CT study suggested in 6-12 months to assess ongoing temporal pattern stability. 2. Few tiny centrilobular pulmonary nodules in the upper lobes, largest 2 mm, for which 2 month stability has been demonstrated, probably benign.  Lab results today (04/30/18) of CBC w/diff, CMP is as follows: all values are WNL except for PLT at 127k, Glucose at 102, total Protein at 6.1. Magnesium 05/01/18 is WNL at 2.1  On review of systems, pt reports good energy levels, resolving SOB, eating well, stable weight, "puffy places" in right armpit, and denies noticing any other lumps or bumps, abdominal pains, and any other symptoms.   MEDICAL HISTORY:  Past Medical History:  Diagnosis Date  . Arthritis    back and hips  . GERD (gastroesophageal reflux disease)    TUMS  . Hypercholesterolemia   . Hypertension   . Non Hodgkin's lymphoma ([place])     SURGICAL HISTORY: Past Surgical History:  Procedure Laterality Date  . LYMPH NODE BIOPSY Left 11/27/2017   Procedure: LEFT INGUINAL LYMPH NODE BIOPSY;  Surgeon: Stark Klein, MD;  Location: [place];  Service: General;  Laterality: Left;  . PORTACATH PLACEMENT Left 12/12/2017   Procedure: INSERTION PORT-A-CATH;  Surgeon: Stark Klein, MD;  Location: [place];  Service: General;  Laterality: Left;  . TONSILLECTOMY      SOCIAL HISTORY: Social History   Socioeconomic History  . Marital status: Widowed    Spouse name: Not on file  . Number of children: Not on file  . Years of education: Not on file  . Highest education level: Not on file  Occupational History  . Not on file  Social Needs  . Financial resource strain: Not on file  . Food insecurity:    Worry: Not on file    Inability: Not on file  . Transportation needs:    Medical: Not on file    Non-medical: Not on file  Tobacco Use  . Smoking status: Never Smoker  . Smokeless tobacco: Never Used  Substance and Sexual Activity  . Alcohol use: No   . Drug use: No  . Sexual activity: Not on file  Lifestyle  . Physical activity:    Days per week: Not on file    Minutes per session: Not on file  . Stress: Not on file  Relationships  . Social connections:    Talks on phone: Not on file    Gets together: Not on file    Attends religious service: Not on file    Active member of club or organization: Not on file    Attends meetings of clubs or organizations: Not on file    Relationship status: Not on file  . Intimate partner violence:    Fear of current or ex partner: Not on file    Emotionally abused: Not on file    Physically abused: Not on file    Forced sexual activity: Not on file  Other Topics Concern  . Not on file  Social History Narrative  . Not on file    FAMILY HISTORY: No family history on file.  ALLERGIES:  has No Known Allergies.  MEDICATIONS:  Current Outpatient Medications  Medication Sig Dispense Refill  . acetaminophen (TYLENOL) 500  MG tablet Take 500 mg by mouth at bedtime as needed for moderate pain.     . bisoprolol-hydrochlorothiazide (ZIAC) 5-6.25 MG tablet Take 1 tablet by mouth 2 (two) times daily.    . ergocalciferol (VITAMIN D2) 50000 units capsule Take 1 capsule (50,000 Units total) by mouth 2 (two) times a week. 24 capsule 1  . hydrOXYzine (ATARAX/VISTARIL) 25 MG tablet Take 1 tablet (25 mg total) by mouth at bedtime as needed and may repeat dose one time if needed (insomnia). 60 tablet 1  . K Phos Mono-Sod Phos Di & Mono (PHOSPHA 250 NEUTRAL) 155-852-130 MG TABS Take 2 tablets by mouth daily. 60 each 0  . lidocaine-prilocaine (EMLA) cream Apply to affected area once 30 g 3  . LORazepam (ATIVAN) 0.5 MG tablet Take 1 tablet (0.5 mg total) by mouth every 6 (six) hours as needed (Nausea or vomiting). 30 tablet 0  . Magnesium 400 MG TABS Take 1 tablet by mouth daily. 30 tablet 1  . Melatonin 10 MG TABS Take 10 mg by mouth at bedtime.     . naproxen sodium (ALEVE) 220 MG tablet Take 220 mg by mouth  daily as needed (pain).    Marland Kitchen omega-3 acid ethyl esters (LOVAZA) 1 g capsule Take 1 g by mouth daily.     . potassium phosphate, monobasic, (K-PHOS ORIGINAL) 500 MG tablet Take 1 tablet (500 mg total) by mouth 2 (two) times daily with a meal. 60 tablet 1  . predniSONE (DELTASONE) 20 MG tablet Take 3 tablets (60 mg total) by mouth daily. Take on days 1-5 of chemotherapy. 15 tablet 5  . senna-docusate (SENNA S) 8.6-50 MG tablet Take 2 tablets by mouth 2 (two) times daily. May reduce to 2 tab po HS once bowel movements established. 60 tablet 2  . simvastatin (ZOCOR) 10 MG tablet Take 10 mg by mouth daily.    . traZODone (DESYREL) 50 MG tablet Take 1-2 tablets (50-100 mg total) by mouth at bedtime. 60 tablet 1   No current facility-administered medications for this visit.    Facility-Administered Medications Ordered in Other Visits  Medication Dose Route Frequency Provider Last Rate Last Dose  . 0.9 %  sodium chloride infusion   Intravenous Once Brunetta Genera, MD      . acetaminophen (TYLENOL) tablet 650 mg  650 mg Oral Once Brunetta Genera, MD      . dexamethasone (DECADRON) injection 10 mg  10 mg Intravenous Once Brunetta Genera, MD      . diphenhydrAMINE (BENADRYL) capsule 50 mg  50 mg Oral Once Brunetta Genera, MD      . heparin lock flush 100 unit/mL  500 Units Intracatheter Once PRN Brunetta Genera, MD      . riTUXimab (RITUXAN) 600 mg in sodium chloride 0.9 % 190 mL infusion  375 mg/m2 (Treatment Plan Recorded) Intravenous Once Brunetta Genera, MD      . sodium chloride flush (NS) 0.9 % injection 10 mL  10 mL Intracatheter PRN Brunetta Genera, MD        REVIEW OF SYSTEMS:    A 10+ POINT REVIEW OF SYSTEMS WAS OBTAINED including neurology, dermatology, psychiatry, cardiac, respiratory, lymph, extremities, GI, GU, Musculoskeletal, constitutional, breasts, reproductive, HEENT.  All pertinent positives are noted in the HPI.  All others are negative.    PHYSICAL EXAMINATION:  ECOG PERFORMANCE STATUS: 0 - Asymptomatic   GENERAL:alert, in no acute distress and comfortable SKIN: no acute rashes, no significant lesions  EYES: conjunctiva are pink and non-injected, sclera anicteric OROPHARYNX: MMM, no exudates, no oropharyngeal erythema or ulceration NECK: supple, no JVD LYMPH:  no palpable lymphadenopathy in the cervical, axillary or inguinal regions LUNGS: clear to auscultation b/l with normal respiratory effort HEART: regular rate & rhythm ABDOMEN:  normoactive bowel sounds , non tender, not distended. No palpable hepatosplenomegaly.  Extremity: no pedal edema PSYCH: alert & oriented x 3 with fluent speech NEURO: no focal motor/sensory deficits    LABORATORY DATA:  I have reviewed the data as listed  . CBC Latest Ref Rng & Units 05/01/2018 04/10/2018 03/12/2018  WBC 3.9 - 10.3 K/uL 5.7 8.6 2.3(L)  Hemoglobin 11.6 - 15.9 g/dL 12.4 12.6 11.1(L)  Hematocrit 34.8 - 46.6 % 37.3 37.9 34.1(L)  Platelets 145 - 400 K/uL 127(L) 149 138(L)  ANC 3k . CBC    Component Value Date/Time   WBC 5.7 05/01/2018 0747   RBC 4.01 05/01/2018 0747   HGB 12.4 05/01/2018 0747   HGB 11.6 02/28/2018 0824   HCT 37.3 05/01/2018 0747   PLT 127 (L) 05/01/2018 0747   PLT 220 02/28/2018 0824   MCV 93.0 05/01/2018 0747   MCH 30.8 05/01/2018 0747   MCHC 33.1 05/01/2018 0747   RDW 12.9 05/01/2018 0747   LYMPHSABS 2.0 05/01/2018 0747   MONOABS 0.5 05/01/2018 0747   EOSABS 0.1 05/01/2018 0747   BASOSABS 0.0 05/01/2018 0747   . CMP Latest Ref Rng & Units 05/01/2018 04/10/2018 03/12/2018  Glucose 70 - 99 mg/dL 102(H) 98 106  BUN 8 - 23 mg/dL 15 17 13   Creatinine 0.44 - 1.00 mg/dL 0.89 1.02(H) 0.90  Sodium 135 - 145 mmol/L 142 142 141  Potassium 3.5 - 5.1 mmol/L 3.9 4.2 3.6  Chloride 98 - 111 mmol/L 107 106 108  CO2 22 - 32 mmol/L 28 28 24   Calcium 8.9 - 10.3 mg/dL 9.4 9.9 9.7  Total Protein 6.5 - 8.1 g/dL 6.1(L) 6.4(L) 6.2(L)  Total Bilirubin 0.3 - 1.2  mg/dL 0.4 0.4 0.3  Alkaline Phos 38 - 126 U/L 90 71 60  AST 15 - 41 U/L 29 30 18   ALT 0 - 44 U/L 25 25 17    . Lab Results  Component Value Date   LDH 240 (H) 04/10/2018   Component     Latest Ref Rng & Units 11/01/2017  Retic Ct Pct     0.7 - 2.1 % 1.2  RBC.     3.70 - 5.45 MIL/uL 5.18  Retic Count, Absolute     33.7 - 90.7 K/uL 62.2  Hep B Core Ab, Tot     Negative Negative  Hepatitis B Surface Ag     Negative Negative  HCV Ab     0.0 - 0.9 s/co ratio <0.1  HIV Screen 4th Generation wRfx     Non Reactive Non Reactive  Sed Rate     0 - 22 mm/hr 1    PATHOLOGY         :        PROCEDURES  ECHO 11/22/17 Study Conclusions - Left ventricle: The cavity size was normal. Septal wall thickness   was increased in a pattern of moderate LVH with otherwise mild   concentric hypertrophy. Systolic function was normal. The   estimated ejection fraction was in the range of 60% to 65%. Wall   motion was normal; there were no regional wall motion   abnormalities. Doppler parameters are consistent with abnormal   left ventricular relaxation (grade  1 diastolic dysfunction).   Doppler parameters are consistent with indeterminate ventricular   filling pressure. - Aortic valve: Transvalvular velocity was within the normal range.   There was no stenosis. There was mild regurgitation. - Mitral valve: Transvalvular velocity was within the normal range.   There was no evidence for stenosis. There was no regurgitation. - Right ventricle: The cavity size was normal. Wall thickness was   normal. Systolic function was normal. - Tricuspid valve: There was mild regurgitation. - Pulmonary arteries: Systolic pressure was within the normal   range. PA peak pressure: 27 mm Hg (S). - Global longitudinal strain -17.7% (normal)    RADIOGRAPHIC STUDIES: I have personally reviewed the radiological images as listed and agreed with the findings in the report. Ct Chest High  Resolution  Result Date: 04/15/2018 CLINICAL DATA:  Dyspnea on exertion for 3 months. History of non-Hodgkin's lymphoma diagnosed January 2019 treated with chemotherapy. EXAM: CT CHEST WITHOUT CONTRAST TECHNIQUE: Multidetector CT imaging of the chest was performed following the standard protocol without intravenous contrast. High resolution imaging of the lungs, as well as inspiratory and expiratory imaging, was performed. COMPARISON:  01/24/2018 chest CT angiogram.  02/25/2018 PET-CT. FINDINGS: Cardiovascular: Normal heart size. No significant pericardial effusion/thickening. Left subclavian MediPort terminates in the middle third of the SVC. Atherosclerotic mildly tortuous thoracic aorta with stable ectasia in the proximal descending thoracic aorta (maximum diameter 3.8 cm). Normal caliber pulmonary arteries. Mediastinum/Nodes: Stable partially calcified 1.0 cm left thyroid isthmus nodule. Unremarkable esophagus. No pathologically enlarged axillary, mediastinal or hilar lymph nodes, noting limited sensitivity for the detection of hilar adenopathy on this noncontrast study. Lungs/Pleura: No pneumothorax. No pleural effusion. No acute consolidative airspace disease or lung masses. Few tiny centrilobular pulmonary nodules in peripheral upper lobes, largest 2 mm in the left upper lobe (series 7/image 18), all stable since 01/24/2018. No new significant pulmonary nodules. Mild patchy subpleural reticulation and ground-glass attenuation in both lungs with associated mild traction bronchiectasis. There is a basilar gradient to these findings. No frank honeycombing. These findings have not appreciably changed since 01/24/2018 CT accounting for differences in inspiration. No significant air trapping on the expiration sequence. Upper abdomen: Nonobstructing 2 mm upper left renal stone. Musculoskeletal: No aggressive appearing focal osseous lesions. Mild thoracic spondylosis. IMPRESSION: 1. Spectrum of findings suggestive  of a basilar predominant fibrotic interstitial lung disease without frank honeycombing and without appreciable change since 01/24/2018 chest CT. Usual interstitial pneumonia (UIP) is possible, although fibrotic phase nonspecific interstitial pneumonia (NSIP) is favored. Follow-up high-resolution chest CT study suggested in 6-12 months to assess ongoing temporal pattern stability. 2. Few tiny centrilobular pulmonary nodules in the upper lobes, largest 2 mm, for which 2 month stability has been demonstrated, probably benign. Electronically Signed   By: Ilona Sorrel M.D.   On: 04/15/2018 15:39    ASSESSMENT & PLAN:   77 y.o. is a female with prior hx of HTN and high cholesterol   1. Recently Diagnosed High grade Follicular lymphoma 2. Significant intra-abdominal generalized lymphadenopathy from high grade FL - resolved  11/08/17 and 11/27/17 LN Biopsy confirms Non-Hodgkin's B Cell Lymphoma -follicular lymphoma High Grade (grade 3) HIV non reactive No symptoms suggest an intra-abd /pelvic inflammatory process at this time.  Baseline LDH levels normal at 200 (11/01/17)  Port placed on 12/12/17 [facility] with nl EF  Component     Latest Ref Rng & Units 11/01/2017  Hep B Core Ab, Tot     Negative Negative  Hepatitis B Surface Ag  Negative Negative  HCV Ab     0.0 - 0.9 s/co ratio <0.1  HIV Screen 4th Generation wRfx     Non Reactive Non Reactive    PET scan from 11/19/17 - with enlarged active lymph nodes primarily in her abdomen and small lymph nodes in her chest and neck, evidence of stage IIIA disease.   PET/CT on 02/25/2018 after 3 cycles of R-CHOP showed  Interval response to therapy with mild residual hypermetabolism  in both hilar regions.  PLAN:  -The pt has no prohibitive toxicities from beginning maintenance Rituxan at this time.   -Discussed pt labwork today, 04/30/18; blood counts and chemistries are stable overall. Magnesium normal.  -Reviewed 04/15/18 CT Chest which revealed a Spectrum  of findings suggestive of a basilar predominant fibrotic interstitial lung disease without frank honeycombing and without appreciable change since 01/24/18 CT Chest. Usual interstitial pneumonia (UIP) is possible, although fibrotic phase nonspecific interstitial pneumonia (NSIP) is favored. Few tiny centrilobular pulmonary nodules in the upper lobes, largest 2 mm, for which 2 month stability has been demonstrated, probably benign -Discussed the 04/15/18 CT Chest with pulmonologist Dr. Chase Caller- will continue maintenance Rituxan with indication of a non-acute lung process.  -Discussed that the PLT at 127k would not be prohibitive for cataract surgery -Pt will be transitioning her care to [facility] as this is much closer to home and will be seeing Dr. Derek Jack  -I discussed with the pt that I did not feel any enlarged LN or anything of concern in her right armpit and no other clinical or lab evidence of progression of her lymphoma at this time. -Would recommend repeat PET scan after 2-3 maintenance Rituxan infusions   3. HTN -She is on HTN medication PLAN  -mx per PCP   F/u with Dr Delton Coombes at [facility] for continued oncologic cares closer to home. Will be getting subsequent maintenance Rituxan infusion at [place] of the patients questions were answered with apparent satisfaction. The patient knows to call the clinic with any problems, questions or concerns.  . The total time spent in the appointment was 25 minutes and more than 50% was on counseling and direct patient cares.      Sullivan Lone MD MS AAHIVMS [facility] Hematology/Oncology Physician [facility]  (Office):       [phone] (Work cell):  [phone] (Fax):           [phone]  05/01/2018 9:52 AM  I, Baldwin [place], am acting as a Education administrator for Dr Irene Limbo.   .I have reviewed the above documentation for accuracy and completeness, and I agree with the above. Brunetta Genera MD

## 2018-05-01 ENCOUNTER — Inpatient Hospital Stay: Payer: Medicare HMO

## 2018-05-01 ENCOUNTER — Telehealth: Payer: Self-pay | Admitting: Internal Medicine

## 2018-05-01 ENCOUNTER — Inpatient Hospital Stay: Payer: Medicare HMO | Attending: Hematology

## 2018-05-01 ENCOUNTER — Inpatient Hospital Stay (HOSPITAL_BASED_OUTPATIENT_CLINIC_OR_DEPARTMENT_OTHER): Payer: Medicare HMO | Admitting: Hematology

## 2018-05-01 VITALS — BP 149/81 | HR 61 | Temp 97.8°F | Resp 17 | Wt 130.2 lb

## 2018-05-01 DIAGNOSIS — Z7189 Other specified counseling: Secondary | ICD-10-CM

## 2018-05-01 DIAGNOSIS — C8238 Follicular lymphoma grade IIIa, lymph nodes of multiple sites: Secondary | ICD-10-CM

## 2018-05-01 DIAGNOSIS — Z5111 Encounter for antineoplastic chemotherapy: Secondary | ICD-10-CM

## 2018-05-01 DIAGNOSIS — R911 Solitary pulmonary nodule: Secondary | ICD-10-CM | POA: Insufficient documentation

## 2018-05-01 DIAGNOSIS — C8285 Other types of follicular lymphoma, lymph nodes of inguinal region and lower limb: Secondary | ICD-10-CM | POA: Insufficient documentation

## 2018-05-01 DIAGNOSIS — Z5112 Encounter for antineoplastic immunotherapy: Secondary | ICD-10-CM | POA: Insufficient documentation

## 2018-05-01 DIAGNOSIS — R0600 Dyspnea, unspecified: Secondary | ICD-10-CM

## 2018-05-01 DIAGNOSIS — I10 Essential (primary) hypertension: Secondary | ICD-10-CM

## 2018-05-01 DIAGNOSIS — Z95828 Presence of other vascular implants and grafts: Secondary | ICD-10-CM

## 2018-05-01 LAB — CBC WITH DIFFERENTIAL/PLATELET
Basophils Absolute: 0 10*3/uL (ref 0.0–0.1)
Basophils Relative: 1 %
EOS ABS: 0.1 10*3/uL (ref 0.0–0.5)
EOS PCT: 2 %
HCT: 37.3 % (ref 34.8–46.6)
Hemoglobin: 12.4 g/dL (ref 11.6–15.9)
LYMPHS ABS: 2 10*3/uL (ref 0.9–3.3)
Lymphocytes Relative: 35 %
MCH: 30.8 pg (ref 25.1–34.0)
MCHC: 33.1 g/dL (ref 31.5–36.0)
MCV: 93 fL (ref 79.5–101.0)
MONO ABS: 0.5 10*3/uL (ref 0.1–0.9)
MONOS PCT: 9 %
Neutro Abs: 3 10*3/uL (ref 1.5–6.5)
Neutrophils Relative %: 53 %
PLATELETS: 127 10*3/uL — AB (ref 145–400)
RBC: 4.01 MIL/uL (ref 3.70–5.45)
RDW: 12.9 % (ref 11.2–14.5)
WBC: 5.7 10*3/uL (ref 3.9–10.3)

## 2018-05-01 LAB — CMP (CANCER CENTER ONLY)
ALT: 25 U/L (ref 0–44)
AST: 29 U/L (ref 15–41)
Albumin: 3.8 g/dL (ref 3.5–5.0)
Alkaline Phosphatase: 90 U/L (ref 38–126)
Anion gap: 7 (ref 5–15)
BUN: 15 mg/dL (ref 8–23)
CALCIUM: 9.4 mg/dL (ref 8.9–10.3)
CHLORIDE: 107 mmol/L (ref 98–111)
CO2: 28 mmol/L (ref 22–32)
Creatinine: 0.89 mg/dL (ref 0.44–1.00)
GFR, Estimated: >60 mL/min (ref >60)
Glucose, Bld: 102 mg/dL — ABNORMAL HIGH (ref 70–99)
Potassium: 3.9 mmol/L (ref 3.5–5.1)
SODIUM: 142 mmol/L (ref 135–145)
Total Bilirubin: 0.4 mg/dL (ref 0.3–1.2)
Total Protein: 6.1 g/dL — ABNORMAL LOW (ref 6.5–8.1)

## 2018-05-01 LAB — MAGNESIUM: Magnesium: 2.1 mg/dL (ref 1.7–2.4)

## 2018-05-01 LAB — PHOSPHORUS: Phosphorus: 3.4 mg/dL (ref 2.5–4.6)

## 2018-05-01 MED ORDER — SODIUM CHLORIDE 0.9 % IV SOLN
375.0000 mg/m2 | Freq: Once | INTRAVENOUS | Status: AC
  Administered 2018-05-01: 600 mg via INTRAVENOUS
  Filled 2018-05-01: qty 50

## 2018-05-01 MED ORDER — SODIUM CHLORIDE 0.9% FLUSH
10.0000 mL | INTRAVENOUS | Status: DC | PRN
Start: 2018-05-01 — End: 2018-05-01
  Administered 2018-05-01: 10 mL
  Filled 2018-05-01: qty 10

## 2018-05-01 MED ORDER — DEXAMETHASONE SODIUM PHOSPHATE 10 MG/ML IJ SOLN
INTRAMUSCULAR | Status: AC
  Filled 2018-05-01: qty 1

## 2018-05-01 MED ORDER — DIPHENHYDRAMINE HCL 25 MG PO CAPS
50.0000 mg | ORAL_CAPSULE | Freq: Once | ORAL | Status: AC
  Administered 2018-05-01: 50 mg via ORAL

## 2018-05-01 MED ORDER — ACETAMINOPHEN 325 MG PO TABS
ORAL_TABLET | ORAL | Status: AC
  Filled 2018-05-01: qty 2

## 2018-05-01 MED ORDER — SODIUM CHLORIDE 0.9 % IV SOLN
Freq: Once | INTRAVENOUS | Status: AC
  Administered 2018-05-01: 10:00:00 via INTRAVENOUS

## 2018-05-01 MED ORDER — SODIUM CHLORIDE 0.9% FLUSH
10.0000 mL | Freq: Once | INTRAVENOUS | Status: AC
  Administered 2018-05-01: 10 mL
  Filled 2018-05-01: qty 10

## 2018-05-01 MED ORDER — HEPARIN SOD (PORK) LOCK FLUSH 100 UNIT/ML IV SOLN
500.0000 [IU] | Freq: Once | INTRAVENOUS | Status: AC | PRN
Start: 2018-05-01 — End: 2018-05-01
  Administered 2018-05-01: 500 [IU]
  Filled 2018-05-01: qty 5

## 2018-05-01 MED ORDER — DEXAMETHASONE SODIUM PHOSPHATE 10 MG/ML IJ SOLN
10.0000 mg | Freq: Once | INTRAMUSCULAR | Status: AC
  Administered 2018-05-01: 10 mg via INTRAVENOUS

## 2018-05-01 MED ORDER — ACETAMINOPHEN 325 MG PO TABS
650.0000 mg | ORAL_TABLET | Freq: Once | ORAL | Status: AC
  Administered 2018-05-01: 650 mg via ORAL

## 2018-05-01 MED ORDER — DIPHENHYDRAMINE HCL 25 MG PO CAPS
ORAL_CAPSULE | ORAL | Status: AC
Start: 2018-05-01 — End: ?
  Filled 2018-05-01: qty 2

## 2018-05-01 NOTE — Telephone Encounter (Signed)
Pt is calling back [phone]

## 2018-05-01 NOTE — Patient Instructions (Signed)
[facility] Discharge Instructions for Patients Receiving Chemotherapy  Today you received the following chemotherapy agents:  Rituxan.  To help prevent nausea and vomiting after your treatment, we encourage you to take your nausea medication as directed.   If you develop nausea and vomiting that is not controlled by your nausea medication, call the clinic.   BELOW ARE SYMPTOMS THAT SHOULD BE REPORTED IMMEDIATELY:  *FEVER GREATER THAN 100.5 F  *CHILLS WITH OR WITHOUT FEVER  NAUSEA AND VOMITING THAT IS NOT CONTROLLED WITH YOUR NAUSEA MEDICATION  *UNUSUAL SHORTNESS OF BREATH  *UNUSUAL BRUISING OR BLEEDING  TENDERNESS IN MOUTH AND THROAT WITH OR WITHOUT PRESENCE OF ULCERS  *URINARY PROBLEMS  *BOWEL PROBLEMS  UNUSUAL RASH Items with * indicate a potential emergency and should be followed up as soon as possible.  Feel free to call the clinic should you have any questions or concerns. The clinic phone number is [phone].  Please show the CHEMO ALERT CARD at check-in to the Emergency Department and triage nurse.   

## 2018-05-01 NOTE — Telephone Encounter (Signed)
Got call from Dr Irene Limbo 05/01/2018  9:21 AM - I visualized her high-resolution CT chest from 04/15/2018. It looks like she has very mild chronic ILD changes. In fact the CT to me look better than April 2019. I certainly do not see any ground glass opacities. I advised that if her functional status is good and she came handle chemotherapy she could get chemotherapy at this point.  Raquel Sarna I do not see any follow-up appointment with me please give first available in ILD clinic - she should have spirometry and DLCO prior to visit

## 2018-05-01 NOTE — Telephone Encounter (Signed)
Attempted to call pt but no answer. Left message for pt to return call x1.  Looking at MR's schedule in ILD clinic and PFT schedule, first avail appt to get pt in for 81min PFT followed by OV in ILD clinic is Tues 8/20. We can get pt scheduled for 79min PFT at 4pm followed by OV with MR at 4:30.  Will await call back from pt to get her scheduled for appts.

## 2018-05-01 NOTE — Telephone Encounter (Signed)
Called and spoke with patient she stated that she has some appointments that she needs to look at and will call us back once she can schedule an appointment. Will route to EP to follow up on.

## 2018-05-02 ENCOUNTER — Telehealth: Payer: Self-pay

## 2018-05-02 ENCOUNTER — Ambulatory Visit: Payer: Medicare HMO | Admitting: Cardiology

## 2018-05-02 NOTE — Telephone Encounter (Signed)
F/u with Dr Delton Coombes at [facility] for continued oncologic cares closer to home. Will be getting subsequent maintenance Rituxan infusion at [facility]. Per 7/18 los

## 2018-05-03 NOTE — Telephone Encounter (Signed)
Noted  

## 2018-05-03 NOTE — Telephone Encounter (Signed)
Patient called I have scheduled patient for PFT on 06/06/18 @ 1:30pm and OV with MR at 2:30pm -pr

## 2018-05-03 NOTE — Telephone Encounter (Signed)
Attempted to call patient today regarding to schedule 30 PFT prior to ILD Clinic appt next available with MR. I did not receive an answer at time of call. I have left a voicemail message for pt to return call. X1

## 2018-05-08 ENCOUNTER — Other Ambulatory Visit (HOSPITAL_COMMUNITY): Payer: Self-pay | Admitting: *Deleted

## 2018-05-08 DIAGNOSIS — C8238 Follicular lymphoma grade IIIa, lymph nodes of multiple sites: Secondary | ICD-10-CM

## 2018-05-09 ENCOUNTER — Inpatient Hospital Stay (HOSPITAL_COMMUNITY): Payer: Medicare HMO

## 2018-05-09 ENCOUNTER — Other Ambulatory Visit: Payer: Self-pay

## 2018-05-09 ENCOUNTER — Inpatient Hospital Stay (HOSPITAL_COMMUNITY): Payer: Medicare HMO | Attending: Hematology | Admitting: Hematology

## 2018-05-09 ENCOUNTER — Encounter (HOSPITAL_COMMUNITY): Payer: Self-pay | Admitting: Hematology

## 2018-05-09 DIAGNOSIS — C8238 Follicular lymphoma grade IIIa, lymph nodes of multiple sites: Secondary | ICD-10-CM

## 2018-05-09 DIAGNOSIS — Z801 Family history of malignant neoplasm of trachea, bronchus and lung: Secondary | ICD-10-CM | POA: Diagnosis not present

## 2018-05-09 DIAGNOSIS — Z809 Family history of malignant neoplasm, unspecified: Secondary | ICD-10-CM

## 2018-05-09 DIAGNOSIS — Z8051 Family history of malignant neoplasm of kidney: Secondary | ICD-10-CM | POA: Diagnosis not present

## 2018-05-09 LAB — COMPREHENSIVE METABOLIC PANEL
ALK PHOS: 78 U/L (ref 38–126)
ALT: 27 U/L (ref 0–44)
AST: 31 U/L (ref 15–41)
Albumin: 4 g/dL (ref 3.5–5.0)
Anion gap: 7 (ref 5–15)
BUN: 21 mg/dL (ref 8–23)
CALCIUM: 9.2 mg/dL (ref 8.9–10.3)
CHLORIDE: 108 mmol/L (ref 98–111)
CO2: 27 mmol/L (ref 22–32)
CREATININE: 0.89 mg/dL (ref 0.44–1.00)
GFR calc Af Amer: >60 mL/min (ref >60)
GFR calc non Af Amer: >60 mL/min (ref >60)
Glucose, Bld: 107 mg/dL — ABNORMAL HIGH (ref 70–99)
Potassium: 3.8 mmol/L (ref 3.5–5.1)
Sodium: 142 mmol/L (ref 135–145)
Total Bilirubin: 0.7 mg/dL (ref 0.3–1.2)
Total Protein: 6.4 g/dL — ABNORMAL LOW (ref 6.5–8.1)

## 2018-05-09 LAB — CBC WITH DIFFERENTIAL/PLATELET
BASOS ABS: 0 10*3/uL (ref 0.0–0.1)
Basophils Relative: 0 %
EOS PCT: 2 %
Eosinophils Absolute: 0.1 10*3/uL (ref 0.0–0.7)
HEMATOCRIT: 40.5 % (ref 36.0–46.0)
HEMOGLOBIN: 13.2 g/dL (ref 12.0–15.0)
LYMPHS PCT: 35 %
Lymphs Abs: 2.8 10*3/uL (ref 0.7–4.0)
MCH: 30.6 pg (ref 26.0–34.0)
MCHC: 32.6 g/dL (ref 30.0–36.0)
MCV: 93.8 fL (ref 78.0–100.0)
Monocytes Absolute: 0.7 10*3/uL (ref 0.1–1.0)
Monocytes Relative: 8 %
NEUTROS ABS: 4.4 10*3/uL (ref 1.7–7.7)
NEUTROS PCT: 55 %
PLATELETS: 151 10*3/uL (ref 150–400)
RBC: 4.32 MIL/uL (ref 3.87–5.11)
RDW: 12.7 % (ref 11.5–15.5)
WBC: 8.1 10*3/uL (ref 4.0–10.5)

## 2018-05-09 NOTE — Patient Instructions (Signed)
[facility] at [facility] Discharge Instructions  Today you saw Dr. K.   Thank you for choosing  Cancer Center at [facility] to provide your oncology and hematology care.  To afford each patient quality time with our provider, please arrive at least 15 minutes before your scheduled appointment time.   If you have a lab appointment with the Cancer Center please come in thru the  Main Entrance and check in at the main information desk  You need to re-schedule your appointment should you arrive 10 or more minutes late.  We strive to give you quality time with our providers, and arriving late affects you and other patients whose appointments are after yours.  Also, if you no show three or more times for appointments you may be dismissed from the clinic at the providers discretion.     Again, thank you for choosing [facility].  Our hope is that these requests will decrease the amount of time that you wait before being seen by our physicians.       _____________________________________________________________  Should you have questions after your visit to  Cancer Center, please contact our office at [phone] between the hours of 8:30 a.m. and 4:30 p.m.  Voicemails left after 4:30 p.m. will not be returned until the following business day.  For prescription refill requests, have your pharmacy contact our office.       Resources For Cancer Patients and their Caregivers ? American Cancer Society: Can assist with transportation, wigs, general needs, runs Look Good Feel Better.        1-888-227-6333 ? Cancer Care: Provides financial assistance, online support groups, medication/co-pay assistance.  1-800-813-HOPE (4673) ? [facility] Assists [place] cancer patients and their families through emotional , educational and financial support.  336-427-4357 ? [place] DSS Where to apply for food  stamps, Medicaid and utility assistance. 336-342-1394 ? [facility]: Transportation to medical appointments. 336-347-2287 ? Social Security Administration: May apply for disability if have a Stage IV cancer. 336-342-7796 1-800-772-1213 ? [place] Aging, Disability and Transit Services: Assists with nutrition, care and transit needs. 336-349-2343  Cancer Center Support Programs:   > Cancer Support Group  2nd Tuesday of the month 1pm-2pm, Journey Room   > Creative Journey  3rd Tuesday of the month 1130am-1pm, Journey Room    

## 2018-05-09 NOTE — Assessment & Plan Note (Signed)
1.  High-grade follicular lymphoma: - Presentation with left inguinal lymph node enlargement, no B symptoms.  Initial needle biopsy on 11/08/2017 consistent with non-Hodgkin's lymphoma. -Excisional biopsy on 11/27/2017 shows high-grade follicular lymphoma (grade 3A) - She was treated with 4 cycles of R-CHOP from 12/28/2017 through 02/28/2018.  She had breathing difficulty after second and third treatments. -Hence her chemotherapy was discontinued after cycle 4.  She was started on maintenance rituximab on 05/01/2018.  She has tolerated it very well. -PET CT scan in May 2019 showed complete response in the inguinal and retroperitoneal areas.  There was mild activity in the hilar regions.  High-resolution CT scan on 04/15/2018 of the chest did not show any adenopathy. - As she lives in [place], she wanted to continue her treatments close to home. - She will come back in 7 weeks to receive her maintenance rituximab.  I will see her back in 4 months with repeat CT scan of the chest, abdomen and pelvis with contrast.

## 2018-05-09 NOTE — Progress Notes (Signed)
[facility] CONSULT NOTE  Patient Care Team: Celene Squibb, MD as PCP - General (Internal Medicine)  CHIEF COMPLAINTS/PURPOSE OF CONSULTATION:  Follow-up for high-grade follicular lymphoma.  HISTORY OF PRESENTING ILLNESS:  Alicia [name] 77 y.o. female is seen in consultation today for further work-up and management of high-grade follicular lymphoma.  She developed left inguinal adenopathy earlier this year.  A needle biopsy on 11/08/2017 showed non-Hodgkin's lymphoma.  She underwent excision biopsy in February which showed grade 3A follicular lymphoma.  She did not have any fevers, night sweats or weight loss at that time.  The PET CT scan at diagnosis showed bulky retroperitoneal adenopathy with notable mesenteric, bilateral common iliac, bilateral external iliac and bilateral inguinal adenopathy.  She was then started on R-CHOP on 12/28/2017.  PET CT scan after 3 cycles showed interval response to therapy with mild residual hypermetabolism in bilateral hilar regions.  She underwent cycle 4 on 02/28/2018.  After second cycle, she developed breathing difficulty after each cycle of chemotherapy.  This typically happened 4 days after each cycle.  Hence chemotherapy was discontinued after cycle 4.  She has been placed on maintenance rituximab which she received last week on 05/01/2018.  She has done very well so far.  Because of her breathing difficulty a high-resolution CT scan of the chest was done on 04/15/2018. She lives by herself at home as she is widowed.  She is independent of all ADLs and IADLs.  She worked in [employer] prior to retirement.  She denies any chemical or pesticide exposure. -Family history significant for mother dying of lung cancer, father died of kidney cancer.  2 maternal uncles died of cancer, patient does not know the type.  One paternal uncle also died of cancer.  MEDICAL HISTORY:  Past Medical History:  Diagnosis Date  . Arthritis    back and hips  . GERD  (gastroesophageal reflux disease)    TUMS  . Hypercholesterolemia   . Hypertension   . Non Hodgkin's lymphoma ([place])     SURGICAL HISTORY: Past Surgical History:  Procedure Laterality Date  . LYMPH NODE BIOPSY Left 11/27/2017   Procedure: LEFT INGUINAL LYMPH NODE BIOPSY;  Surgeon: Stark Klein, MD;  Location: [facility];  Service: General;  Laterality: Left;  . PORTACATH PLACEMENT Left 12/12/2017   Procedure: INSERTION PORT-A-CATH;  Surgeon: Stark Klein, MD;  Location: [facility];  Service: General;  Laterality: Left;  . TONSILLECTOMY      SOCIAL HISTORY: Social History   Socioeconomic History  . Marital status: Widowed    Spouse name: Not on file  . Number of children: Not on file  . Years of education: Not on file  . Highest education level: Not on file  Occupational History  . Not on file  Social Needs  . Financial resource strain: Not on file  . Food insecurity:    Worry: Not on file    Inability: Not on file  . Transportation needs:    Medical: Not on file    Non-medical: Not on file  Tobacco Use  . Smoking status: Never Smoker  . Smokeless tobacco: Never Used  Substance and Sexual Activity  . Alcohol use: No  . Drug use: No  . Sexual activity: Not on file  Lifestyle  . Physical activity:    Days per week: Not on file    Minutes per session: Not on file  . Stress: Not on file  Relationships  . Social connections:  Talks on phone: Not on file    Gets together: Not on file    Attends religious service: Not on file    Active member of club or organization: Not on file    Attends meetings of clubs or organizations: Not on file    Relationship status: Not on file  . Intimate partner violence:    Fear of current or ex partner: Not on file    Emotionally abused: Not on file    Physically abused: Not on file    Forced sexual activity: Not on file  Other Topics Concern  . Not on file  Social History Narrative  . Not on file    FAMILY  HISTORY: Family History  Problem Relation Age of Onset  . Lung cancer Mother   . Kidney cancer Father   . Alcoholism Brother   . Emphysema Maternal Aunt   . Cancer Maternal Uncle   . Cancer Maternal Uncle     ALLERGIES:  has No Known Allergies.  MEDICATIONS:  Current Outpatient Medications  Medication Sig Dispense Refill  . acetaminophen (TYLENOL) 500 MG tablet Take 500 mg by mouth at bedtime as needed for moderate pain.     . bisoprolol-hydrochlorothiazide (ZIAC) 5-6.25 MG tablet Take 1 tablet by mouth 2 (two) times daily.    Marland Kitchen lidocaine-prilocaine (EMLA) cream Apply to affected area once 30 g 3  . LORazepam (ATIVAN) 0.5 MG tablet Take 1 tablet (0.5 mg total) by mouth every 6 (six) hours as needed (Nausea or vomiting). 30 tablet 0  . Melatonin 10 MG TABS Take 10 mg by mouth at bedtime.     . naproxen sodium (ALEVE) 220 MG tablet Take 220 mg by mouth daily as needed (pain).    Marland Kitchen omega-3 acid ethyl esters (LOVAZA) 1 g capsule Take 1 g by mouth daily.     . simvastatin (ZOCOR) 10 MG tablet Take 10 mg by mouth daily.    . ergocalciferol (VITAMIN D2) 50000 units capsule Take 1 capsule (50,000 Units total) by mouth 2 (two) times a week. (Patient not taking: Reported on 05/09/2018) 24 capsule 1  . hydrOXYzine (ATARAX/VISTARIL) 25 MG tablet Take 1 tablet (25 mg total) by mouth at bedtime as needed and may repeat dose one time if needed (insomnia). (Patient not taking: Reported on 05/09/2018) 60 tablet 1  . K Phos Mono-Sod Phos Di & Mono (PHOSPHA 250 NEUTRAL) 155-852-130 MG TABS Take 2 tablets by mouth daily. (Patient not taking: Reported on 05/09/2018) 60 each 0  . Magnesium 400 MG TABS Take 1 tablet by mouth daily. (Patient not taking: Reported on 05/09/2018) 30 tablet 1  . potassium phosphate, monobasic, (K-PHOS ORIGINAL) 500 MG tablet Take 1 tablet (500 mg total) by mouth 2 (two) times daily with a meal. (Patient not taking: Reported on 05/09/2018) 60 tablet 1  . predniSONE (DELTASONE) 20 MG  tablet Take 3 tablets (60 mg total) by mouth daily. Take on days 1-5 of chemotherapy. (Patient not taking: Reported on 05/09/2018) 15 tablet 5  . senna-docusate (SENNA S) 8.6-50 MG tablet Take 2 tablets by mouth 2 (two) times daily. May reduce to 2 tab po HS once bowel movements established. (Patient not taking: Reported on 05/09/2018) 60 tablet 2  . traZODone (DESYREL) 50 MG tablet Take 1-2 tablets (50-100 mg total) by mouth at bedtime. (Patient not taking: Reported on 05/09/2018) 60 tablet 1   No current facility-administered medications for this visit.     REVIEW OF SYSTEMS:   Constitutional: Denies  fevers, chills or abnormal night sweats Eyes: Denies blurriness of vision, double vision or watery eyes Ears, nose, mouth, throat, and face: Denies mucositis or sore throat Respiratory: Denies cough, dyspnea or wheezes Cardiovascular: Denies palpitation, chest discomfort or lower extremity swelling Gastrointestinal: Complains of constipation.  Denies any nausea vomiting or diarrhea.  Skin: Denies abnormal skin rashes Lymphatics: Denies new lymphadenopathy or easy bruising Neurological:Denies numbness, tingling or new weaknesses Behavioral/Psych: Mood is stable, no new changes  All other systems were reviewed with the patient and are negative.  PHYSICAL EXAMINATION: ECOG PERFORMANCE STATUS: 1 - Symptomatic but completely ambulatory  Vitals:   05/09/18 1303  BP: (!) 170/97  Pulse: 60  Resp: 18  Temp: 97.9 F (36.6 C)  SpO2: 100%   Filed Weights   05/09/18 1303  Weight: 128 lb 1.6 oz (58.1 kg)    GENERAL:alert, no distress and comfortable SKIN: skin color, texture, turgor are normal, no rashes or significant lesions EYES: normal, conjunctiva are pink and non-injected, sclera clear OROPHARYNX:no exudate, no erythema and lips, buccal mucosa, and tongue normal  NECK: supple, thyroid normal size, non-tender, without nodularity LYMPH:  no palpable lymphadenopathy in the cervical, axillary  or inguinal LUNGS: clear to auscultation and percussion with normal breathing effort HEART: regular rate & rhythm and no murmurs and no lower extremity edema ABDOMEN:abdomen soft, non-tender and normal bowel sounds PSYCH: alert & oriented x 3 with fluent speech   LABORATORY DATA:  I have reviewed the data as listed Lab Results  Component Value Date   WBC 8.1 05/09/2018   HGB 13.2 05/09/2018   HCT 40.5 05/09/2018   MCV 93.8 05/09/2018   PLT 151 05/09/2018     Chemistry      Component Value Date/Time   NA 142 05/09/2018 1149   K 3.8 05/09/2018 1149   CL 108 05/09/2018 1149   CO2 27 05/09/2018 1149   BUN 21 05/09/2018 1149   CREATININE 0.89 05/09/2018 1149   CREATININE 0.89 05/01/2018 0747      Component Value Date/Time   CALCIUM 9.2 05/09/2018 1149   ALKPHOS 78 05/09/2018 1149   AST 31 05/09/2018 1149   AST 29 05/01/2018 0747   ALT 27 05/09/2018 1149   ALT 25 05/01/2018 0747   BILITOT 0.7 05/09/2018 1149   BILITOT 0.4 05/01/2018 0747       RADIOGRAPHIC STUDIES: I have personally reviewed the radiological images as listed and agreed with the findings in the report. Ct Chest High Resolution  Result Date: 04/15/2018 CLINICAL DATA:  Dyspnea on exertion for 3 months. History of non-Hodgkin's lymphoma diagnosed January 2019 treated with chemotherapy. EXAM: CT CHEST WITHOUT CONTRAST TECHNIQUE: Multidetector CT imaging of the chest was performed following the standard protocol without intravenous contrast. High resolution imaging of the lungs, as well as inspiratory and expiratory imaging, was performed. COMPARISON:  01/24/2018 chest CT angiogram.  02/25/2018 PET-CT. FINDINGS: Cardiovascular: Normal heart size. No significant pericardial effusion/thickening. Left subclavian MediPort terminates in the middle third of the SVC. Atherosclerotic mildly tortuous thoracic aorta with stable ectasia in the proximal descending thoracic aorta (maximum diameter 3.8 cm). Normal caliber pulmonary  arteries. Mediastinum/Nodes: Stable partially calcified 1.0 cm left thyroid isthmus nodule. Unremarkable esophagus. No pathologically enlarged axillary, mediastinal or hilar lymph nodes, noting limited sensitivity for the detection of hilar adenopathy on this noncontrast study. Lungs/Pleura: No pneumothorax. No pleural effusion. No acute consolidative airspace disease or lung masses. Few tiny centrilobular pulmonary nodules in peripheral upper lobes, largest 2 mm in the  left upper lobe (series 7/image 18), all stable since 01/24/2018. No new significant pulmonary nodules. Mild patchy subpleural reticulation and ground-glass attenuation in both lungs with associated mild traction bronchiectasis. There is a basilar gradient to these findings. No frank honeycombing. These findings have not appreciably changed since 01/24/2018 CT accounting for differences in inspiration. No significant air trapping on the expiration sequence. Upper abdomen: Nonobstructing 2 mm upper left renal stone. Musculoskeletal: No aggressive appearing focal osseous lesions. Mild thoracic spondylosis. IMPRESSION: 1. 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 Usual interstitial pneumonia (UIP) is possible, although fibrotic phase nonspecific interstitial pneumonia (NSIP) is favored. Follow-up high-resolution chest CT study suggested in 6-12 months to assess ongoing temporal pattern stability. 2. Few tiny centrilobular pulmonary nodules in the upper lobes, largest 2 mm, for which 2 month stability has been demonstrated, probably benign. Electronically Signed   By: Ilona Sorrel M.D.   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  High-grade follicular lymphoma: - Presentation with left inguinal lymph node enlargement, no B symptoms.  Initial  needle biopsy on 11/08/2017 consistent with non-Hodgkin's lymphoma. -Excisional biopsy on 11/27/2017 shows high-grade follicular lymphoma (grade 3A) - She was treated with 4 cycles of R-CHOP from 12/28/2017 through 02/28/2018.  She had breathing difficulty after second and third treatments. -Hence her chemotherapy was discontinued after cycle 4.  She was started on maintenance rituximab on 05/01/2018.  She has tolerated it very well. -PET CT scan in May 2019 showed complete response in the inguinal and retroperitoneal areas.  There was mild activity in the hilar regions.  High-resolution CT scan on 04/15/2018 of the chest did not show any adenopathy. - As she lives in [place], she wanted to continue her treatments close to home. - She will come back in 7 weeks to receive her maintenance rituximab.  I will see her back in 4 months with repeat CT scan of the chest, abdomen and pelvis with contrast.  No orders of the defined types were placed in this encounter.   All questions were answered. The patient knows to call the clinic with any problems, questions or concerns.      Derek Jack, MD 05/09/2018 5:44 PM

## 2018-05-13 ENCOUNTER — Other Ambulatory Visit (HOSPITAL_COMMUNITY): Payer: Self-pay | Admitting: *Deleted

## 2018-05-13 DIAGNOSIS — C8238 Follicular lymphoma grade IIIa, lymph nodes of multiple sites: Secondary | ICD-10-CM

## 2018-05-13 MED ORDER — LORAZEPAM 0.5 MG PO TABS: 0.5000 mg | ORAL_TABLET | Freq: Four times a day (QID) | ORAL | 0 refills | Status: DC | PRN

## 2018-05-14 ENCOUNTER — Telehealth (HOSPITAL_COMMUNITY): Payer: Self-pay | Admitting: General Practice

## 2018-05-14 NOTE — Telephone Encounter (Signed)
[facility] CSW Progress Note  Call to patient to follow up on new patient visit and assess for needs/resources.  No answer, left VM w contact information for patient to return call if desired.   Edwyna Shell, LCSW Clinical Social Worker Phone:  [phone]

## 2018-05-17 ENCOUNTER — Other Ambulatory Visit (HOSPITAL_COMMUNITY): Payer: Self-pay | Admitting: *Deleted

## 2018-05-27 DIAGNOSIS — H3540 Unspecified peripheral retinal degeneration: Secondary | ICD-10-CM | POA: Diagnosis not present

## 2018-05-27 DIAGNOSIS — H35363 Drusen (degenerative) of macula, bilateral: Secondary | ICD-10-CM | POA: Diagnosis not present

## 2018-05-27 DIAGNOSIS — H2511 Age-related nuclear cataract, right eye: Secondary | ICD-10-CM | POA: Diagnosis not present

## 2018-05-27 DIAGNOSIS — H25013 Cortical age-related cataract, bilateral: Secondary | ICD-10-CM | POA: Diagnosis not present

## 2018-05-27 DIAGNOSIS — H2513 Age-related nuclear cataract, bilateral: Secondary | ICD-10-CM | POA: Diagnosis not present

## 2018-05-28 ENCOUNTER — Other Ambulatory Visit (HOSPITAL_COMMUNITY): Payer: Self-pay | Admitting: Nurse Practitioner

## 2018-06-03 ENCOUNTER — Encounter (INDEPENDENT_AMBULATORY_CARE_PROVIDER_SITE_OTHER): Payer: Self-pay | Admitting: Ophthalmology

## 2018-06-04 ENCOUNTER — Encounter (INDEPENDENT_AMBULATORY_CARE_PROVIDER_SITE_OTHER): Payer: Medicare HMO | Admitting: Ophthalmology

## 2018-06-04 DIAGNOSIS — H35033 Hypertensive retinopathy, bilateral: Secondary | ICD-10-CM

## 2018-06-04 DIAGNOSIS — H43813 Vitreous degeneration, bilateral: Secondary | ICD-10-CM

## 2018-06-04 DIAGNOSIS — I10 Essential (primary) hypertension: Secondary | ICD-10-CM

## 2018-06-04 DIAGNOSIS — H2513 Age-related nuclear cataract, bilateral: Secondary | ICD-10-CM | POA: Diagnosis not present

## 2018-06-04 DIAGNOSIS — H353132 Nonexudative age-related macular degeneration, bilateral, intermediate dry stage: Secondary | ICD-10-CM | POA: Diagnosis not present

## 2018-06-05 ENCOUNTER — Other Ambulatory Visit: Payer: Self-pay | Admitting: Internal Medicine

## 2018-06-05 DIAGNOSIS — R0609 Other forms of dyspnea: Secondary | ICD-10-CM

## 2018-06-06 ENCOUNTER — Ambulatory Visit (INDEPENDENT_AMBULATORY_CARE_PROVIDER_SITE_OTHER): Payer: Medicare HMO | Admitting: Internal Medicine

## 2018-06-06 ENCOUNTER — Encounter: Payer: Self-pay | Admitting: Internal Medicine

## 2018-06-06 ENCOUNTER — Other Ambulatory Visit (INDEPENDENT_AMBULATORY_CARE_PROVIDER_SITE_OTHER): Payer: Medicare HMO

## 2018-06-06 VITALS — BP 118/64 | HR 56 | Ht 66.0 in | Wt 127.0 lb

## 2018-06-06 DIAGNOSIS — J849 Interstitial pulmonary disease, unspecified: Secondary | ICD-10-CM

## 2018-06-06 DIAGNOSIS — R0609 Other forms of dyspnea: Secondary | ICD-10-CM

## 2018-06-06 DIAGNOSIS — J984 Other disorders of lung: Secondary | ICD-10-CM

## 2018-06-06 DIAGNOSIS — T451X5A Adverse effect of antineoplastic and immunosuppressive drugs, initial encounter: Secondary | ICD-10-CM

## 2018-06-06 LAB — PULMONARY FUNCTION TEST
DL/VA % pred: 74 %
DL/VA: 3.77 ml/min/mmHg/L
DLCO COR: 15.35 ml/min/mmHg
DLCO UNC % PRED: 56 %
DLCO cor % pred: 56 %
DLCO unc: 15.25 ml/min/mmHg
FEF 25-75 PRE: 2.05 L/s
FEF2575-%PRED-PRE: 126 %
FEV1-%Pred-Pre: 98 %
FEV1-Pre: 2.17 L
FEV1FVC-%PRED-PRE: 106 %
FEV6-%Pred-Pre: 97 %
FEV6-PRE: 2.73 L
FEV6FVC-%Pred-Pre: 104 %
FVC-%Pred-Pre: 93 %
FVC-Pre: 2.74 L
PRE FEV6/FVC RATIO: 99 %
Pre FEV1/FVC ratio: 79 %

## 2018-06-06 LAB — SEDIMENTATION RATE: SED RATE: 3 mm/h (ref 0–30)

## 2018-06-06 NOTE — Progress Notes (Signed)
Spirometry and Dlco done today. 

## 2018-06-06 NOTE — Addendum Note (Signed)
Addended by: Lorretta Harp on: 06/06/2018 02:30 PM   Modules accepted: Orders

## 2018-06-06 NOTE — Progress Notes (Signed)
Subjective:     Patient ID: Alicia [name], female   DOB: 1941-03-28, 77 y.o.   MRN: [id number]    HPI  HPI  IOV 03/14/2018  Chief Complaint  Patient presents with  . Consult    Has been on chemo for non hodgkins lymphoma since March 15th of 2019. She has been sent ER twice due to SOB.   Alicia [name] 1941-08-19 female [address] [place] [address] -has been referred for episodic dyspnea.  She is accompanied by her son.  History is gained from talking to her and review of the oncology records.  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  She has been started on all-CHOP chemotherapy.  Status post 4 cycles at this point.  First cycle was given Friday December 28, 2017.  Cycles are every 3 weeks.  According to her and her son after the first cycle there was no side effects.  Then beginning the second cycle which presumably was given 01/24/18,  exactly on the 6 days she started developing shortness of breath.  The sixth day also corresponded to 3 days after subcutaneous injection NEULESTA to improve her white count.This  resulted in inpatient admission January 24, 2018 for 1 day.  Was associated with very high bP per hx and  severe hypophosphatemia < 1.0 and with correction of phosphorus her symptoms improved. Then 3rd cycle was on 02/08/18 with neulesta on 02/11/18 per chart rview. Then on 02/14/18 (6 days after 3rd cycle, 3 days after neulesta) she ended up in ER . Her phos was slightly low 2.2 and K3.2 . These were corrected and she was discharged.  Saw Dr Alicia [name] oncology on 02/28/18 - high dose Vit D started because of inability to retain phosphorus. .  Cards referral placed. BP control monitored. Neulesta changed to Granix/Neupogen for schedule 5/17, 5/20, 5/21, 5/22 Alicia [name].  Then after the third cycle this was changed to Granix but despite this exactly on the sixth day after chemo she gets short of breath.  Initially the shortness of breath episodes were  brief and transitory but with progressive cycles the shortness of breath has become more intense and more prolonged lasting few to several days.   Subsequently Feb 14, 2018 she had an ER visit for low phosphorus.  At this point in time she is completed 4 cycles of the chemotherapy (not sure date of Cycle #4 but was due 03/08/18) and she had the most intense symptoms after the fourth cycle but decided against going to the emergency department.  Her next fifth cycle is due next week but her oncologist Dr. Carolyne [name] has put it on hold pending cardiac and pulmonary work-up.     Review of the labs show that as of 2 days ago she still had low phosphorus.  It appears the low phosphorus correlates with nadir white count but in phone cal wih Dr Alicia [name] 03/15/2018 -[name] his is not always consistent.  This is documented below.  She did have pulmonary function test Mar 08, 2018 that I personally visualized and this corresponds to normal PFT except for isolated reduction diffusion capacity to 48% even accounting for anemia correction hemoglobin was around 11 g% that day.Marland Kitchen  However walk test today was normal.  She had CT angiogram PE protocol January 14, 2018 this to rule out pulmonary embolism.  She has bibasal atelectasis that could potentially account for the lower diffusion but the CT chest was much earlier time point.  Her most recent chest x-ray Feb 14, 2018 look clear to me.  All images personally visualized by me  Exam nitric oxide today is 11 ppb and effectively rules out eosinophilic asthma.  She has upcoming cardiac stress test tomorrow but echocardiogram recently March 2019 was satisfactory and she has been reassured by Dr. Alvester [name] that probability for coronary ischemia is very low.   Also according to the patient she spoke to the oncology pharmacist at [facility] and was told that most likely this was some red chemotherapy injection causing her problems.  She does not believe this to be Rituxan.  Simple office walk 185  feet x  3 laps goal with forehead probe 03/14/2018   O2 used Room air  Number laps completed 3  Comments about pace moderate  Resting Pulse Ox/HR 98% and 67/min  Final Pulse Ox/HR 98% and 78/min  Desaturated </= 88% no  Desaturated <= 3% points no  Got Tachycardic >/= 90/min no  Symptoms at end of test Mild dyspnea but continues  Miscellaneous comments      OV 06/06/2018  Chief Complaint  Patient presents with  . Follow-up    PFT performed today.  Pt states she has been doing good since last visit since chemo has been stopped. Denies any complaints.     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 Her chemotherapy has been completed/stopped. With this her functional status fatigue and shortness of breath of all improved. She had a follow-up CT scan of the chest and July 2019 that I personally visualized and it shows significant improvement in her pulmonary inflammation. In fact corresponding with thisthe diffusion capacity has improved. She says she never had this problem before the chemotherapy and in fact when I personally visualized and January 2019 cT abdomen lung cut she did not have any ILD. She denies any clinical autoimmune disease but not tested for this. She says her goal of care is to anginal quality of life. She is not interested in lung biopsy. She only wants supportive follow-up. She is open to having some autoimmune and vasculitis panel blood tests  Results for Alicia [name], Alicia [name] (MRN [id number]) as of 06/06/2018 13:55  Ref. Range 03/08/2018 07:45 06/06/2018 13:11  FVC-Pre Latest Units: L 2.79 2.74  FVC-%Pred-Pre Latest Units: % 94 93   Results for [facility], Alicia [name] (MRN [id number]) as of 06/06/2018 13:55  Ref. Range 03/08/2018 07:45 06/06/2018 13:11  DLCO cor Latest Units: ml/min/mmHg 13.17 15.35  DLCO cor % pred Latest Units: % 48 56     Simple office walk 185 feet  x  3 laps goal with forehead probe 06/06/2018   O2 used Room air  Number laps completed 3  Comments about pace Normal pace  Resting Pulse Ox/HR 100%% and 56/min  Final Pulse Ox/HR 97% and 75/min  Desaturated </= 88% no  Desaturated <= 3% points yes  Got Tachycardic >/= 90/min no  Symptoms at end of test none  Miscellaneous comments x      has a past medical history of Arthritis, GERD (gastroesophageal reflux disease), Hypercholesterolemia, Hypertension, and Non Hodgkin's lymphoma ([place]).   reports that she has never smoked. She has never used smokeless tobacco.  Past Surgical History:  Procedure Laterality Date  . LYMPH NODE BIOPSY Left 11/27/2017   Procedure: LEFT INGUINAL LYMPH NODE BIOPSY;  Surgeon: Stark Klein, MD;  Location: [facility];  Service: General;  Laterality: Left;  . PORTACATH PLACEMENT Left 12/12/2017   Procedure: INSERTION PORT-A-CATH;  Surgeon: Stark Klein, MD;  Location: [facility];  Service: General;  Laterality: Left;  . TONSILLECTOMY      No Known Allergies   There is no immunization history on file for this patient.  Family History  Problem Relation Age of Onset  . Lung cancer Mother   . Kidney cancer Father   . Alcoholism Brother   . Emphysema Maternal Aunt   . Cancer Maternal Uncle   . Cancer Maternal Uncle      Current Outpatient Medications:  .  acetaminophen (TYLENOL) 500 MG tablet, Take 500 mg by mouth at bedtime as needed for moderate pain. , Disp: , Rfl:  .  bisoprolol-hydrochlorothiazide (ZIAC) 5-6.25 MG tablet, Take 1 tablet by mouth 2 (two) times daily., Disp: , Rfl:  .  LORazepam (ATIVAN) 0.5 MG tablet, Take 1 tablet (0.5 mg total) by mouth every 6 (six) hours as needed (Nausea or vomiting)., Disp: 30 tablet, Rfl: 0 .  Melatonin 10 MG TABS, Take 10 mg by mouth at bedtime. , Disp: , Rfl:  .  Multiple Vitamins-Minerals (PRESERVISION AREDS 2 PO), Take 2 capsules by mouth daily., Disp: , Rfl:  .  naproxen sodium (ALEVE) 220 MG  tablet, Take 220 mg by mouth daily as needed (pain)., Disp: , Rfl:  .  omega-3 acid ethyl esters (LOVAZA) 1 g capsule, Take 1 g by mouth daily. , Disp: , Rfl:  .  senna-docusate (SENNA S) 8.6-50 MG tablet, Take 2 tablets by mouth 2 (two) times daily. May reduce to 2 tab po HS once bowel movements established. (Patient taking differently: Take 2 tablets by mouth as needed. ), Disp: 60 tablet, Rfl: 2 .  simvastatin (ZOCOR) 10 MG tablet, Take 10 mg by mouth daily., Disp: , Rfl:     has a past medical history of Arthritis, GERD (gastroesophageal reflux disease), Hypercholesterolemia, Hypertension, and Non Hodgkin's lymphoma ([place]).   reports that she has never smoked. She has never used smokeless tobacco.  Past Surgical History:  Procedure Laterality Date  . LYMPH NODE BIOPSY Left 11/27/2017   Procedure: LEFT INGUINAL LYMPH NODE BIOPSY;  Surgeon: Stark Klein, MD;  Location: [facility];  Service: General;  Laterality: Left;  . PORTACATH PLACEMENT Left 12/12/2017   Procedure: INSERTION PORT-A-CATH;  Surgeon: Stark Klein, MD;  Location: [facility];  Service: General;  Laterality: Left;  . TONSILLECTOMY      No Known Allergies   There is no immunization history on file for this patient.  Family History  Problem Relation Age of Onset  . Lung cancer Mother   . Kidney cancer Father   . Alcoholism Brother   . Emphysema Maternal Aunt   . Cancer Maternal Uncle   . Cancer Maternal Uncle      Current Outpatient Medications:  .  acetaminophen (TYLENOL) 500 MG tablet, Take 500 mg by mouth at bedtime as needed for moderate pain. , Disp: , Rfl:  .  bisoprolol-hydrochlorothiazide (ZIAC) 5-6.25 MG tablet, Take 1 tablet by mouth 2 (two) times daily., Disp: , Rfl:  .  LORazepam (ATIVAN) 0.5 MG tablet, Take 1 tablet (0.5 mg total) by mouth every 6 (six) hours as needed (Nausea or vomiting)., Disp: 30 tablet, Rfl: 0 .  Melatonin 10 MG TABS, Take 10 mg by mouth at bedtime. , Disp: , Rfl:  .   Multiple Vitamins-Minerals (PRESERVISION AREDS 2 PO), Take 2 capsules by mouth daily.,  Disp: , Rfl:  .  naproxen sodium (ALEVE) 220 MG tablet, Take 220 mg by mouth daily as needed (pain)., Disp: , Rfl:  .  omega-3 acid ethyl esters (LOVAZA) 1 g capsule, Take 1 g by mouth daily. , Disp: , Rfl:  .  senna-docusate (SENNA S) 8.6-50 MG tablet, Take 2 tablets by mouth 2 (two) times daily. May reduce to 2 tab po HS once bowel movements established. (Patient taking differently: Take 2 tablets by mouth as needed. ), Disp: 60 tablet, Rfl: 2 .  simvastatin (ZOCOR) 10 MG tablet, Take 10 mg by mouth daily., Disp: , Rfl:     Review of Systems     Objective:   Physical Exam  Constitutional: She is oriented to person, place, and time. She appears well-developed and well-nourished. No distress.  HENT:  Head: Normocephalic and atraumatic.  Right Ear: External ear normal.  Left Ear: External ear normal.  Mouth/Throat: Oropharynx is clear and moist. No oropharyngeal exudate.  Eyes: Pupils are equal, round, and reactive to light. Conjunctivae and EOM are normal. Right eye exhibits no discharge. Left eye exhibits no discharge. No scleral icterus.  Neck: Normal range of motion. Neck supple. No JVD present. No tracheal deviation present. No thyromegaly present.  Cardiovascular: Normal rate, regular rhythm, normal heart sounds and intact distal pulses. Exam reveals no gallop and no friction rub.  No murmur heard. Pulmonary/Chest: Effort normal. No respiratory distress. She has no wheezes. She has rales. She exhibits no tenderness.  Mild scatered crack;es  Abdominal: Soft. Bowel sounds are normal. She exhibits no distension and no mass. There is no tenderness. There is no rebound and no guarding.  Musculoskeletal: Normal range of motion. She exhibits no edema or tenderness.  Lymphadenopathy:    She has no cervical adenopathy.  Neurological: She is alert and oriented to person, place, and time. She has normal  reflexes. No cranial nerve deficit. She exhibits normal muscle tone. Coordination normal.  Skin: Skin is warm and dry. No rash noted. She is not diaphoretic. No erythema. No pallor.  Psychiatric: She has a normal mood and affect. Her behavior is normal. Judgment and thought content normal.  Vitals reviewed.  Vitals:   06/06/18 1342  BP: 118/64  Pulse: (!) 56  SpO2: 97%  Weight: 127 lb (57.6 kg)  Height: _0  (1.676 m)    Estimated body mass index is 20.5 kg/m as calculated from the following:   Height as of this encounter: _1  (1.676 m).   Weight as of this encounter: 127 lb (57.6 kg).       Assessment:       ICD-10-CM   1. ILD (interstitial lung disease) ([place]) J84.9   2. 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  Plan - Serum: ESR, ACE, ANA, DS-DNA, RF, anti-CCP, ssA, ssB, scl-70, ANCA screen, MPO, PR-3, Total CK,  RNP, Aldolase,  Hypersensitivity Pneumonitis Panel - will  call with results - Continue rituxan for lymphoma per oncologist - can help with ILD in lung  [name] 9 months to Pre-bd spiro and dlco only. No lung volume or bd response. No post-bd spiro Return to ILD clinic in 9 months or sooner if needed  > 50% of this > 25 min visit spent in face to face counseling or coordination of care - by this undersigned MD - Dr Brand Males. This includes one or more of the following documented above: discussion of test results, diagnostic or treatment recommendations, prognosis, risks and benefits of  management options, instructions, education, compliance or risk-factor reduction   Dr. Brand Males, M.D., [facility].C.P Pulmonary and Critical Care Medicine Staff Physician, [place] Director - Interstitial Lung Disease  Program  Pulmonary [place] [facility] [place], [place], [address]  Pager: [phone], If no answer or between  15:00h - 7:00h: call [phone] Telephone: [phone]

## 2018-06-06 NOTE — Patient Instructions (Addendum)
ICD-10-CM   1. ILD (interstitial lung disease) ([place]) J84.9   2. 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  Plan - Serum: ESR, ACE, ANA, DS-DNA, RF, anti-CCP, ssA, ssB, scl-70, ANCA screen, MPO, PR-3, Total CK,  RNP, Aldolase,  Hypersensitivity Pneumonitis Panel - will call with results - Continue rituxan for lymphoma per oncologist - can help with ILD in lung  Followup 9 months to Pre-bd spiro and dlco only. No lung volume or bd response. No post-bd spiro Return to ILD clinic in 9 months or sooner if needed

## 2018-06-07 LAB — RNP ANTIBODIES

## 2018-06-11 DIAGNOSIS — H2512 Age-related nuclear cataract, left eye: Secondary | ICD-10-CM | POA: Diagnosis not present

## 2018-06-11 DIAGNOSIS — H25812 Combined forms of age-related cataract, left eye: Secondary | ICD-10-CM | POA: Diagnosis not present

## 2018-06-11 LAB — HYPERSENSITIVITY PNUEMONITIS PROFILE
ASPERGILLUS FUMIGATUS: NEGATIVE
Faenia retivirgula: NEGATIVE
Pigeon Serum: NEGATIVE
S. VIRIDIS: NEGATIVE
T. CANDIDUS: NEGATIVE
T. VULGARIS: NEGATIVE

## 2018-06-11 LAB — SJOGREN'S SYNDROME ANTIBODS(SSA + SSB)
SSA (Ro) (ENA) Antibody, IgG: <1 AI
SSB (LA) (ENA) ANTIBODY, IGG: NEGATIVE AI

## 2018-06-11 LAB — ANTI-DNA ANTIBODY, DOUBLE-STRANDED

## 2018-06-11 LAB — MPO/PR-3 (ANCA) ANTIBODIES: Serine Protease 3: <1 AI

## 2018-06-11 LAB — CYCLIC CITRUL PEPTIDE ANTIBODY, IGG

## 2018-06-11 LAB — ALDOLASE: ALDOLASE: 4.3 U/L (ref <=8.1)

## 2018-06-11 LAB — ANTI-NUCLEAR AB-TITER (ANA TITER): ANA Titer 1: 1:80 {titer} — ABNORMAL HIGH

## 2018-06-11 LAB — RHEUMATOID FACTOR: Rhuematoid fact SerPl-aCnc: <14 IU/mL (ref <14)

## 2018-06-11 LAB — ANGIOTENSIN CONVERTING ENZYME: ANGIOTENSIN-CONVERTING ENZYME: 47 U/L (ref 9–67)

## 2018-06-11 LAB — CK TOTAL AND CKMB (NOT AT ARMC)
CK, MB: 1.8 ng/mL (ref 0–5.0)
Relative Index: 2.3 (ref 0–4.0)
Total CK: 77 U/L (ref 29–143)

## 2018-06-11 LAB — ANCA SCREEN W REFLEX TITER
ANCA SCREEN: POSITIVE — AB
Atypical P-ANCA titer: 1:20 {titer} — ABNORMAL HIGH

## 2018-06-11 LAB — ANA: Anti Nuclear Antibody(ANA): POSITIVE — AB

## 2018-06-11 LAB — ANTI-SCLERODERMA ANTIBODY: Scleroderma (Scl-70) (ENA) Antibody, IgG: <1 AI

## 2018-06-12 ENCOUNTER — Telehealth: Payer: Self-pay | Admitting: Internal Medicine

## 2018-06-12 NOTE — Telephone Encounter (Signed)
Autoimmune, vasculitis and HP panel essentially - NEGATIVE (othern than trace positive ANa and anca). She Is goig to get get rituxan which is good but recommend a) she talks to doc giving rituxan and ask if she needs to be on bactrim to prevent infections and b) ROV in 9 months - per recent OV

## 2018-06-12 NOTE — Telephone Encounter (Signed)
ATC pt, no answer. Left message for pt to call back.  MR please review lab results.

## 2018-06-12 NOTE — Telephone Encounter (Signed)
Spoke with pt. She is aware of MR response. States she is going to speak the doctor who will administered the Rituxin. Recall has been placed for 9 month ROV. Nothing further was needed.

## 2018-06-14 ENCOUNTER — Other Ambulatory Visit (HOSPITAL_COMMUNITY): Payer: Self-pay | Admitting: Nurse Practitioner

## 2018-06-19 ENCOUNTER — Other Ambulatory Visit (HOSPITAL_COMMUNITY): Payer: Self-pay | Admitting: Nurse Practitioner

## 2018-06-26 ENCOUNTER — Other Ambulatory Visit (HOSPITAL_COMMUNITY): Payer: Self-pay

## 2018-06-26 DIAGNOSIS — C8238 Follicular lymphoma grade IIIa, lymph nodes of multiple sites: Secondary | ICD-10-CM

## 2018-06-26 DIAGNOSIS — Z5111 Encounter for antineoplastic chemotherapy: Secondary | ICD-10-CM

## 2018-07-01 ENCOUNTER — Other Ambulatory Visit: Payer: Medicare HMO

## 2018-07-01 ENCOUNTER — Inpatient Hospital Stay (HOSPITAL_COMMUNITY): Payer: Medicare HMO | Attending: Internal Medicine

## 2018-07-01 ENCOUNTER — Other Ambulatory Visit (HOSPITAL_COMMUNITY): Payer: Self-pay | Admitting: Nurse Practitioner

## 2018-07-01 ENCOUNTER — Inpatient Hospital Stay (HOSPITAL_COMMUNITY): Payer: Medicare HMO

## 2018-07-01 ENCOUNTER — Other Ambulatory Visit: Payer: Self-pay

## 2018-07-01 ENCOUNTER — Ambulatory Visit: Payer: Medicare HMO | Admitting: Hematology

## 2018-07-01 ENCOUNTER — Ambulatory Visit (HOSPITAL_COMMUNITY): Payer: Medicare HMO | Admitting: Internal Medicine

## 2018-07-01 ENCOUNTER — Ambulatory Visit: Payer: Medicare HMO

## 2018-07-01 ENCOUNTER — Encounter (HOSPITAL_COMMUNITY): Payer: Self-pay

## 2018-07-01 VITALS — BP 135/63 | HR 56 | Temp 97.8°F | Resp 18 | Wt 127.1 lb

## 2018-07-01 DIAGNOSIS — C8510 Unspecified B-cell lymphoma, unspecified site: Secondary | ICD-10-CM | POA: Diagnosis not present

## 2018-07-01 DIAGNOSIS — Z5112 Encounter for antineoplastic immunotherapy: Secondary | ICD-10-CM | POA: Insufficient documentation

## 2018-07-01 DIAGNOSIS — C8238 Follicular lymphoma grade IIIa, lymph nodes of multiple sites: Secondary | ICD-10-CM | POA: Diagnosis present

## 2018-07-01 DIAGNOSIS — R599 Enlarged lymph nodes, unspecified: Secondary | ICD-10-CM | POA: Diagnosis not present

## 2018-07-01 DIAGNOSIS — M25551 Pain in right hip: Secondary | ICD-10-CM | POA: Diagnosis not present

## 2018-07-01 DIAGNOSIS — K219 Gastro-esophageal reflux disease without esophagitis: Secondary | ICD-10-CM | POA: Diagnosis not present

## 2018-07-01 DIAGNOSIS — I10 Essential (primary) hypertension: Secondary | ICD-10-CM | POA: Diagnosis not present

## 2018-07-01 DIAGNOSIS — Z7189 Other specified counseling: Secondary | ICD-10-CM

## 2018-07-01 DIAGNOSIS — Z6821 Body mass index (BMI) 21.0-21.9, adult: Secondary | ICD-10-CM | POA: Diagnosis not present

## 2018-07-01 DIAGNOSIS — E538 Deficiency of other specified B group vitamins: Secondary | ICD-10-CM | POA: Diagnosis not present

## 2018-07-01 DIAGNOSIS — R69 Illness, unspecified: Secondary | ICD-10-CM | POA: Diagnosis not present

## 2018-07-01 DIAGNOSIS — R0602 Shortness of breath: Secondary | ICD-10-CM | POA: Diagnosis not present

## 2018-07-01 DIAGNOSIS — M25511 Pain in right shoulder: Secondary | ICD-10-CM | POA: Diagnosis not present

## 2018-07-01 LAB — COMPREHENSIVE METABOLIC PANEL
ALBUMIN: 4.2 g/dL (ref 3.5–5.0)
ALK PHOS: 75 U/L (ref 38–126)
ALT: 24 U/L (ref 0–44)
ANION GAP: 9 (ref 5–15)
AST: 28 U/L (ref 15–41)
BUN: 15 mg/dL (ref 8–23)
CHLORIDE: 104 mmol/L (ref 98–111)
CO2: 30 mmol/L (ref 22–32)
CREATININE: 0.88 mg/dL (ref 0.44–1.00)
Calcium: 9.5 mg/dL (ref 8.9–10.3)
GFR calc Af Amer: >60 mL/min (ref >60)
GFR calc non Af Amer: >60 mL/min (ref >60)
GLUCOSE: 167 mg/dL — AB (ref 70–99)
Potassium: 3.8 mmol/L (ref 3.5–5.1)
SODIUM: 143 mmol/L (ref 135–145)
Total Bilirubin: 0.8 mg/dL (ref 0.3–1.2)
Total Protein: 6.7 g/dL (ref 6.5–8.1)

## 2018-07-01 LAB — CBC WITH DIFFERENTIAL/PLATELET
Basophils Absolute: 0.1 10*3/uL (ref 0.0–0.1)
Basophils Relative: 1 %
EOS ABS: 0.1 10*3/uL (ref 0.0–0.7)
Eosinophils Relative: 1 %
HCT: 44 % (ref 36.0–46.0)
HEMOGLOBIN: 14.3 g/dL (ref 12.0–15.0)
Lymphocytes Relative: 29 %
Lymphs Abs: 2.4 10*3/uL (ref 0.7–4.0)
MCH: 29.7 pg (ref 26.0–34.0)
MCHC: 32.5 g/dL (ref 30.0–36.0)
MCV: 91.5 fL (ref 78.0–100.0)
MONOS PCT: 7 %
Monocytes Absolute: 0.6 10*3/uL (ref 0.1–1.0)
NEUTROS PCT: 62 %
Neutro Abs: 5 10*3/uL (ref 1.7–7.7)
Platelets: 150 10*3/uL (ref 150–400)
RBC: 4.81 MIL/uL (ref 3.87–5.11)
RDW: 13.8 % (ref 11.5–15.5)
WBC: 8.1 10*3/uL (ref 4.0–10.5)

## 2018-07-01 MED ORDER — SODIUM CHLORIDE 0.9 % IV SOLN
Freq: Once | INTRAVENOUS | Status: AC
  Administered 2018-07-01: 10:00:00 via INTRAVENOUS

## 2018-07-01 MED ORDER — SODIUM CHLORIDE 0.9 % IV SOLN
10.0000 mg | Freq: Once | INTRAVENOUS | Status: AC
  Administered 2018-07-01: 10 mg via INTRAVENOUS
  Filled 2018-07-01: qty 1

## 2018-07-01 MED ORDER — HEPARIN SOD (PORK) LOCK FLUSH 100 UNIT/ML IV SOLN
500.0000 [IU] | Freq: Once | INTRAVENOUS | Status: AC | PRN
  Administered 2018-07-01: 500 [IU]

## 2018-07-01 MED ORDER — ACETAMINOPHEN 325 MG PO TABS
ORAL_TABLET | ORAL | Status: AC
  Filled 2018-07-01: qty 2

## 2018-07-01 MED ORDER — SODIUM CHLORIDE 0.9 % IV SOLN: 375.0000 mg/m2 | Freq: Once | INTRAVENOUS | Status: DC

## 2018-07-01 MED ORDER — SODIUM CHLORIDE 0.9 % IV SOLN
375.0000 mg/m2 | Freq: Once | INTRAVENOUS | Status: DC
  Filled 2018-07-01: qty 60

## 2018-07-01 MED ORDER — ACETAMINOPHEN 325 MG PO TABS
650.0000 mg | ORAL_TABLET | Freq: Once | ORAL | Status: AC
  Administered 2018-07-01: 650 mg via ORAL

## 2018-07-01 MED ORDER — DIPHENHYDRAMINE HCL 25 MG PO CAPS
50.0000 mg | ORAL_CAPSULE | Freq: Once | ORAL | Status: AC
  Administered 2018-07-01: 50 mg via ORAL

## 2018-07-01 MED ORDER — SODIUM CHLORIDE 0.9 % IV SOLN
375.0000 mg/m2 | Freq: Once | INTRAVENOUS | Status: AC
  Administered 2018-07-01: 600 mg via INTRAVENOUS
  Filled 2018-07-01: qty 50

## 2018-07-01 MED ORDER — DIPHENHYDRAMINE HCL 25 MG PO CAPS
ORAL_CAPSULE | ORAL | Status: AC
  Filled 2018-07-01: qty 2

## 2018-07-01 NOTE — Progress Notes (Signed)
Tolerated infusion w/o adverse reaction.  Alert, in no distress.  VSS.  Discharged ambulatory.  

## 2018-07-03 ENCOUNTER — Ambulatory Visit: Payer: Medicare HMO

## 2018-07-03 ENCOUNTER — Other Ambulatory Visit: Payer: Medicare HMO

## 2018-07-03 ENCOUNTER — Ambulatory Visit: Payer: Medicare HMO | Admitting: Hematology

## 2018-07-05 DIAGNOSIS — J849 Interstitial pulmonary disease, unspecified: Secondary | ICD-10-CM | POA: Diagnosis not present

## 2018-07-05 DIAGNOSIS — Z6820 Body mass index (BMI) 20.0-20.9, adult: Secondary | ICD-10-CM | POA: Diagnosis not present

## 2018-07-05 DIAGNOSIS — H3530 Unspecified macular degeneration: Secondary | ICD-10-CM | POA: Diagnosis not present

## 2018-07-05 DIAGNOSIS — E782 Mixed hyperlipidemia: Secondary | ICD-10-CM | POA: Diagnosis not present

## 2018-07-05 DIAGNOSIS — R7301 Impaired fasting glucose: Secondary | ICD-10-CM | POA: Diagnosis not present

## 2018-07-05 DIAGNOSIS — I10 Essential (primary) hypertension: Secondary | ICD-10-CM | POA: Diagnosis not present

## 2018-07-05 DIAGNOSIS — R69 Illness, unspecified: Secondary | ICD-10-CM | POA: Diagnosis not present

## 2018-07-05 DIAGNOSIS — N200 Calculus of kidney: Secondary | ICD-10-CM | POA: Diagnosis not present

## 2018-07-05 DIAGNOSIS — C8510 Unspecified B-cell lymphoma, unspecified site: Secondary | ICD-10-CM | POA: Diagnosis not present

## 2018-07-08 DIAGNOSIS — H2511 Age-related nuclear cataract, right eye: Secondary | ICD-10-CM | POA: Diagnosis not present

## 2018-07-08 DIAGNOSIS — H25011 Cortical age-related cataract, right eye: Secondary | ICD-10-CM | POA: Diagnosis not present

## 2018-07-11 ENCOUNTER — Telehealth: Payer: Self-pay | Admitting: Internal Medicine

## 2018-07-12 NOTE — Telephone Encounter (Signed)
The patient states that the  Patient she has received a statement saying that she  had a test that was sent to [facility] and to [facility]. Her insurance will not pay for both. Patient is unaware of what lab this was.   I have called Nurse, learning disability in the lab and she says that she will investigate this for [place] and get back to [place] to see what happened.   I have called the patient as we where talking we became disconnected and I called back and left a detailed message for her that we are working on this.  Will leave in basket to follow up on. Also Routing to [place] for follow up

## 2018-07-15 NOTE — Telephone Encounter (Signed)
Alicia [name] called back from the lab she stated that it was  a mistake of the the labs so they would credit the account. I have called and informed the patient and nothing further is needed at this time.

## 2018-07-15 NOTE — Telephone Encounter (Signed)
Left message on the machine with Alicia [name] to follow up on.

## 2018-07-16 DIAGNOSIS — H2511 Age-related nuclear cataract, right eye: Secondary | ICD-10-CM | POA: Diagnosis not present

## 2018-07-16 DIAGNOSIS — H25811 Combined forms of age-related cataract, right eye: Secondary | ICD-10-CM | POA: Diagnosis not present

## 2018-07-19 DIAGNOSIS — E782 Mixed hyperlipidemia: Secondary | ICD-10-CM | POA: Diagnosis not present

## 2018-07-19 DIAGNOSIS — I10 Essential (primary) hypertension: Secondary | ICD-10-CM | POA: Diagnosis not present

## 2018-07-19 DIAGNOSIS — K219 Gastro-esophageal reflux disease without esophagitis: Secondary | ICD-10-CM | POA: Diagnosis not present

## 2018-07-19 DIAGNOSIS — R739 Hyperglycemia, unspecified: Secondary | ICD-10-CM | POA: Diagnosis not present

## 2018-07-19 DIAGNOSIS — R7301 Impaired fasting glucose: Secondary | ICD-10-CM | POA: Diagnosis not present

## 2018-07-28 IMAGING — CT NM PET TUM IMG RESTAG (PS) SKULL BASE T - THIGH
1 of 8 series · 1 of 25 positions shown · non-contrast
Comparison: CT chest 01/24/2017 and PET 11/19/2017. CT abdomen
pelvis 10/26/2017.

CLINICAL DATA: Subsequent treatment strategy for lymphoma.

EXAM:
NUCLEAR MEDICINE PET SKULL BASE TO THIGH
TECHNIQUE: 6.3 mCi F-18 FDG was injected intravenously. Full-ring PET imaging
was performed from the skull base to thigh after the radiotracer. CT
data was obtained and used for attenuation correction and anatomic
localization.
Fasting blood glucose: 110 mg/dl

[Series 4: ct sk_thigh 5.0 b31f · axial · 5.0mm · 0.98mm/px · 1 of 214 slices shown]
[im 214/214  brain]
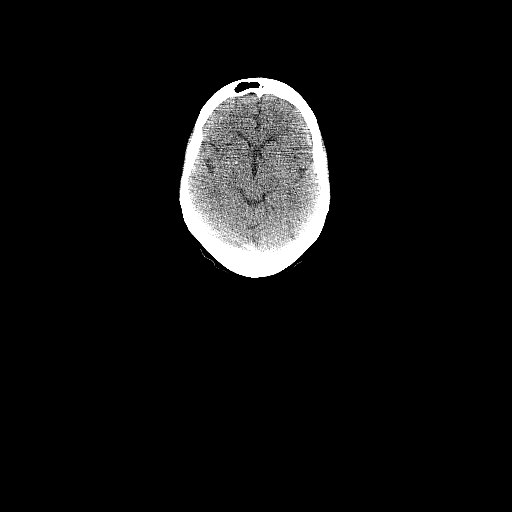

[1 of 25 positions shown; findings below may reference images not displayed]

FINDINGS: Mediastinal blood pool activity: SUV max

NECK: No hypermetabolic lymph nodes in the neck.

Incidental CT findings: None.

CHEST: Mild bihilar hypermetabolism is seen with an SUV max on the
left of 5.3, and on the right, 3.8. Definitive CT correlates are
difficult to assess, without IV contrast. No hypermetabolic
mediastinal or axillary lymph nodes. No hypermetabolic pulmonary
nodules.

Incidental CT findings: Heart is enlarged. Atherosclerotic
calcification of the arterial vasculature. Left-sided Port-A-Cath
terminates in the SVC. No pericardial or pleural effusion.

ABDOMEN/PELVIS: No abnormal hypermetabolism in the liver, adrenal
glands, spleen or pancreas. No hypermetabolic lymph nodes.

Incidental CT findings: Punctate left renal stone. Atherosclerotic
calcification of the arterial vasculature without abdominal aortic
aneurysm. No free fluid.

SKELETON: Mild diffuse osseous hypermetabolism is likely treatment
related.

Incidental CT findings: Probable Tarlov cyst in the sacrum.
Degenerative changes in the spine.
IMPRESSION: 1. Interval response to therapy with mild residual hypermetabolism
in both hilar regions.
2.  Aortic atherosclerosis ([id number]-170.0).
3. Punctate left renal stone.

## 2018-08-06 DIAGNOSIS — Z961 Presence of intraocular lens: Secondary | ICD-10-CM | POA: Diagnosis not present

## 2018-08-14 DIAGNOSIS — Z7689 Persons encountering health services in other specified circumstances: Secondary | ICD-10-CM | POA: Diagnosis not present

## 2018-08-14 DIAGNOSIS — H02403 Unspecified ptosis of bilateral eyelids: Secondary | ICD-10-CM | POA: Diagnosis not present

## 2018-08-26 ENCOUNTER — Other Ambulatory Visit (HOSPITAL_COMMUNITY): Payer: Self-pay | Admitting: *Deleted

## 2018-08-26 DIAGNOSIS — C8238 Follicular lymphoma grade IIIa, lymph nodes of multiple sites: Secondary | ICD-10-CM

## 2018-08-27 ENCOUNTER — Inpatient Hospital Stay (HOSPITAL_COMMUNITY): Payer: Medicare HMO | Attending: Internal Medicine

## 2018-08-27 ENCOUNTER — Ambulatory Visit (HOSPITAL_COMMUNITY)
Admission: RE | Admit: 2018-08-27 | Discharge: 2018-08-27 | Disposition: A | Discharge status: Home / Self Care | Payer: Medicare HMO | Source: Ambulatory Visit | Attending: Nurse Practitioner | Admitting: Nurse Practitioner

## 2018-08-27 DIAGNOSIS — M47816 Spondylosis without myelopathy or radiculopathy, lumbar region: Secondary | ICD-10-CM | POA: Diagnosis not present

## 2018-08-27 DIAGNOSIS — Z5112 Encounter for antineoplastic immunotherapy: Secondary | ICD-10-CM | POA: Diagnosis not present

## 2018-08-27 DIAGNOSIS — M4186 Other forms of scoliosis, lumbar region: Secondary | ICD-10-CM | POA: Insufficient documentation

## 2018-08-27 DIAGNOSIS — R918 Other nonspecific abnormal finding of lung field: Secondary | ICD-10-CM | POA: Insufficient documentation

## 2018-08-27 DIAGNOSIS — R200 Anesthesia of skin: Secondary | ICD-10-CM | POA: Diagnosis not present

## 2018-08-27 DIAGNOSIS — I700 Atherosclerosis of aorta: Secondary | ICD-10-CM | POA: Insufficient documentation

## 2018-08-27 DIAGNOSIS — Z79899 Other long term (current) drug therapy: Secondary | ICD-10-CM | POA: Diagnosis not present

## 2018-08-27 DIAGNOSIS — C8238 Follicular lymphoma grade IIIa, lymph nodes of multiple sites: Secondary | ICD-10-CM | POA: Insufficient documentation

## 2018-08-27 DIAGNOSIS — K5730 Diverticulosis of large intestine without perforation or abscess without bleeding: Secondary | ICD-10-CM | POA: Diagnosis not present

## 2018-08-27 DIAGNOSIS — N200 Calculus of kidney: Secondary | ICD-10-CM | POA: Diagnosis not present

## 2018-08-27 DIAGNOSIS — C8290 Follicular lymphoma, unspecified, unspecified site: Secondary | ICD-10-CM | POA: Diagnosis not present

## 2018-08-27 LAB — CBC WITH DIFFERENTIAL/PLATELET
Abs Immature Granulocytes: 0.02 10*3/uL (ref 0.00–0.07)
BASOS ABS: 0.1 10*3/uL (ref 0.0–0.1)
Basophils Relative: 1 %
EOS ABS: 0.1 10*3/uL (ref 0.0–0.5)
Eosinophils Relative: 2 %
HEMATOCRIT: 41.1 % (ref 36.0–46.0)
HEMOGLOBIN: 12.8 g/dL (ref 12.0–15.0)
Immature Granulocytes: 0 %
LYMPHS ABS: 2.2 10*3/uL (ref 0.7–4.0)
LYMPHS PCT: 31 %
MCH: 28.5 pg (ref 26.0–34.0)
MCHC: 31.1 g/dL (ref 30.0–36.0)
MCV: 91.5 fL (ref 80.0–100.0)
MONO ABS: 0.6 10*3/uL (ref 0.1–1.0)
Monocytes Relative: 9 %
NRBC: 0 % (ref 0.0–0.2)
Neutro Abs: 4.1 10*3/uL (ref 1.7–7.7)
Neutrophils Relative %: 57 %
Platelets: 155 10*3/uL (ref 150–400)
RBC: 4.49 MIL/uL (ref 3.87–5.11)
RDW: 13.8 % (ref 11.5–15.5)
WBC: 7.1 10*3/uL (ref 4.0–10.5)

## 2018-08-27 LAB — COMPREHENSIVE METABOLIC PANEL
ALBUMIN: 4.1 g/dL (ref 3.5–5.0)
ALK PHOS: 85 U/L (ref 38–126)
ALT: 24 U/L (ref 0–44)
ANION GAP: 7 (ref 5–15)
AST: 33 U/L (ref 15–41)
BILIRUBIN TOTAL: 0.9 mg/dL (ref 0.3–1.2)
BUN: 15 mg/dL (ref 8–23)
CALCIUM: 9.1 mg/dL (ref 8.9–10.3)
CO2: 29 mmol/L (ref 22–32)
Chloride: 104 mmol/L (ref 98–111)
Creatinine, Ser: 0.88 mg/dL (ref 0.44–1.00)
GFR calc Af Amer: >60 mL/min (ref >60)
GFR calc non Af Amer: >60 mL/min (ref >60)
GLUCOSE: 95 mg/dL (ref 70–99)
POTASSIUM: 3.6 mmol/L (ref 3.5–5.1)
Sodium: 140 mmol/L (ref 135–145)
TOTAL PROTEIN: 6.7 g/dL (ref 6.5–8.1)

## 2018-08-27 MED ORDER — IOPAMIDOL (ISOVUE-300) INJECTION 61%
100.0000 mL | Freq: Once | INTRAVENOUS | Status: AC | PRN
  Administered 2018-08-27: 100 mL via INTRAVENOUS

## 2018-08-30 ENCOUNTER — Other Ambulatory Visit: Payer: Self-pay

## 2018-08-30 ENCOUNTER — Other Ambulatory Visit (HOSPITAL_COMMUNITY): Payer: Medicare HMO

## 2018-08-30 ENCOUNTER — Encounter (HOSPITAL_COMMUNITY): Payer: Self-pay

## 2018-08-30 ENCOUNTER — Inpatient Hospital Stay (HOSPITAL_COMMUNITY): Payer: Medicare HMO

## 2018-08-30 ENCOUNTER — Inpatient Hospital Stay (HOSPITAL_BASED_OUTPATIENT_CLINIC_OR_DEPARTMENT_OTHER): Payer: Medicare HMO | Admitting: Hematology

## 2018-08-30 ENCOUNTER — Encounter (HOSPITAL_COMMUNITY): Payer: Self-pay | Admitting: Hematology

## 2018-08-30 VITALS — BP 141/50 | HR 55 | Temp 97.7°F | Resp 18 | Wt 129.2 lb

## 2018-08-30 DIAGNOSIS — Z5112 Encounter for antineoplastic immunotherapy: Secondary | ICD-10-CM | POA: Diagnosis not present

## 2018-08-30 DIAGNOSIS — C8238 Follicular lymphoma grade IIIa, lymph nodes of multiple sites: Secondary | ICD-10-CM

## 2018-08-30 DIAGNOSIS — R200 Anesthesia of skin: Secondary | ICD-10-CM | POA: Diagnosis not present

## 2018-08-30 DIAGNOSIS — Z7189 Other specified counseling: Secondary | ICD-10-CM

## 2018-08-30 MED ORDER — HEPARIN SOD (PORK) LOCK FLUSH 100 UNIT/ML IV SOLN
500.0000 [IU] | Freq: Once | INTRAVENOUS | Status: AC | PRN
  Administered 2018-08-30: 500 [IU]
  Filled 2018-08-30: qty 5

## 2018-08-30 MED ORDER — SODIUM CHLORIDE 0.9 % IV SOLN
10.0000 mg | Freq: Once | INTRAVENOUS | Status: AC
  Administered 2018-08-30: 10 mg via INTRAVENOUS
  Filled 2018-08-30: qty 1

## 2018-08-30 MED ORDER — SODIUM CHLORIDE 0.9 % IV SOLN
375.0000 mg/m2 | Freq: Once | INTRAVENOUS | Status: AC
  Administered 2018-08-30: 600 mg via INTRAVENOUS
  Filled 2018-08-30: qty 10

## 2018-08-30 MED ORDER — DIPHENHYDRAMINE HCL 25 MG PO CAPS
50.0000 mg | ORAL_CAPSULE | Freq: Once | ORAL | Status: AC
  Administered 2018-08-30: 50 mg via ORAL
  Filled 2018-08-30: qty 2

## 2018-08-30 MED ORDER — SODIUM CHLORIDE 0.9% FLUSH
10.0000 mL | INTRAVENOUS | Status: DC | PRN
  Administered 2018-08-30: 10 mL
  Filled 2018-08-30: qty 10

## 2018-08-30 MED ORDER — SODIUM CHLORIDE 0.9 % IV SOLN
Freq: Once | INTRAVENOUS | Status: AC
  Administered 2018-08-30: 11:00:00 via INTRAVENOUS

## 2018-08-30 MED ORDER — ACETAMINOPHEN 325 MG PO TABS
650.0000 mg | ORAL_TABLET | Freq: Once | ORAL | Status: AC
  Administered 2018-08-30: 650 mg via ORAL
  Filled 2018-08-30: qty 2

## 2018-08-30 NOTE — Progress Notes (Signed)
[place], [place] [address]   CLINIC:  Medical Oncology/Hematology  PCP:  Celene Squibb, MD [place] [address] [phone]   REASON FOR VISIT:  Follow-up for high-grade follicular lymphoma.  CURRENT THERAPY: Maintenance rituximab.  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 ([place])   12/19/2017 -  Chemotherapy    The patient had DOXOrubicin (ADRIAMYCIN) chemo injection 84 mg, 50 mg/m2 = 84 mg, Intravenous,  Once, 4 of 4 cycles Administration: 84 mg (12/28/2017), 84 mg (01/18/2018), 84 mg (02/08/2018), 84 mg (02/28/2018) palonosetron (ALOXI) injection 0.25 mg, 0.25 mg, Intravenous,  Once, 4 of 4 cycles Administration: 0.25 mg (12/28/2017), 0.25 mg (01/18/2018), 0.25 mg (02/08/2018), 0.25 mg (02/28/2018) pegfilgrastim (NEULASTA) injection 6 mg, 6 mg, Subcutaneous, Once, 3 of 3 cycles Administration: 6 mg (12/31/2017), 6 mg (01/21/2018), 6 mg (02/11/2018) pegfilgrastim (NEULASTA ONPRO KIT) injection 6 mg, 6 mg, Subcutaneous, Once, [name], 2 mg, Intravenous,  Once, 4 of 4 cycles Administration: 2 mg (12/28/2017), 2 mg (01/18/2018), 2 mg (02/08/2018), 2 mg (02/28/2018) riTUXimab (RITUXAN) 600 mg in sodium chloride 0.9 % 250 mL (1.9355 mg/mL) infusion, 375 mg/m2 = 600 mg, Intravenous,  Once, [name], Cycle 6, Reason: Other (see comments)) Administration: 600 mg (12/28/2017) cyclophosphamide (CYTOXAN) 1,[name], 750 mg/m2 = 1,260 mg, Intravenous,  Once, 4 of 4 cycles Administration: 1,260 mg (12/28/2017), 1,260 mg (01/18/2018), 1,260 mg (02/08/2018), 1,260 mg (02/28/2018) riTUXimab (RITUXAN) 600 mg in sodium chloride 0.9 % 190 mL infusion, 375 mg/m2 = 600 mg, Intravenous,  Once, 5 of 12 cycles Dose modification: 375 mg/m2  (original dose 375 mg/m2, Cycle 7) Administration: 600 mg (01/18/2018), 600 mg (02/08/2018), 600 mg (02/28/2018), 600 mg (05/01/2018), 600 mg (07/01/2018)  for chemotherapy treatment.     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 Toohey 77 y.o. female returns for follow-up of her lymphoma and maintenance rituximab therapy.  Denies any fevers, night sweats or weight loss in the preceding 4 months.  Denies any infections or hospitalizations.  Appetite has been good.  Mild numbness in the feet has been stable.  Energy levels are 100%.    REVIEW OF SYSTEMS:  Review of Systems  Respiratory: Positive for shortness of breath.   Neurological: Positive for numbness.  All other systems reviewed and are negative.    PAST MEDICAL/SURGICAL HISTORY:  Past Medical History:  Diagnosis Date  . Arthritis    back and hips  . GERD (gastroesophageal reflux disease)    TUMS  . Hypercholesterolemia   . Hypertension   . Non Hodgkin's lymphoma [facility])    Past Surgical History:  Procedure Laterality Date  . LYMPH NODE BIOPSY Left 11/27/2017   Procedure: LEFT INGUINAL LYMPH NODE BIOPSY;  Surgeon: Stark Klein, MD;  Location: [place];  Service: General;  Laterality: Left;  . PORTACATH PLACEMENT Left 12/12/2017   Procedure: INSERTION PORT-A-CATH;  Surgeon: Stark Klein, MD;  Location: [place];  Service: General;  Laterality: Left;  . TONSILLECTOMY       SOCIAL HISTORY:  Social History   Socioeconomic History  . Marital status: Widowed  Spouse name: Not on file  . Number of children: Not on file  . Years of education: Not on file  . Highest education level: Not on file  Occupational History  . Not on file  Social Needs  . Financial resource strain: Not on file  . Food insecurity:    Worry: Not on file    Inability: Not on file  . Transportation needs:    Medical: Not on file     Non-medical: Not on file  Tobacco Use  . Smoking status: Never Smoker  . Smokeless tobacco: Never Used  Substance and Sexual Activity  . Alcohol use: No  . Drug use: No  . Sexual activity: Not on file  Lifestyle  . Physical activity:    Days per week: Not on file    Minutes per session: Not on file  . Stress: Not on file  Relationships  . Social connections:    Talks on phone: Not on file    Gets together: Not on file    Attends religious service: Not on file    Active member of club or organization: Not on file    Attends meetings of clubs or organizations: Not on file    Relationship status: Not on file  . Intimate partner violence:    Fear of current or ex partner: Not on file    Emotionally abused: Not on file    Physically abused: Not on file    Forced sexual activity: Not on file  Other Topics Concern  . Not on file  Social History Narrative  . Not on file    FAMILY HISTORY:  Family History  Problem Relation Age of Onset  . Lung cancer Mother   . Kidney cancer Father   . Alcoholism Brother   . Emphysema Maternal Aunt   . Cancer Maternal Uncle   . Cancer Maternal Uncle     CURRENT MEDICATIONS:  Outpatient Encounter Medications as of 08/30/2018  Medication Sig  . acetaminophen (TYLENOL) 500 MG tablet Take 500 mg by mouth at bedtime as needed for moderate pain.   . bisoprolol-hydrochlorothiazide (ZIAC) 5-6.25 MG tablet Take 1 tablet by mouth 2 (two) times daily.  . diphenhydramine-acetaminophen (TYLENOL PM) 25-500 MG TABS tablet Take 1 tablet by mouth at bedtime as needed.  Marland Kitchen ketorolac (ACULAR) 0.5 % ophthalmic solution INSTILL 1 DROP IN LEFT EYE FOUR TIMES A DAY ( START 72 HOURS PRIOR TO SURGERY)  . lisinopril (PRINIVIL,ZESTRIL) 20 MG tablet TAKE 1 TABLET BY MOUTH IN THE MORNING FOR BLOOD PRESSURE  . LORazepam (ATIVAN) 0.5 MG tablet Take 1 tablet (0.5 mg total) by mouth every 6 (six) hours as needed (Nausea or vomiting).  . Melatonin 10 MG TABS Take 10 mg by  mouth at bedtime.   . Multiple Vitamins-Minerals (PRESERVISION AREDS 2 PO) Take 2 capsules by mouth daily.  . naproxen sodium (ALEVE) 220 MG tablet Take 220 mg by mouth daily as needed (pain).  Marland Kitchen omega-3 acid ethyl esters (LOVAZA) 1 g capsule Take 1 g by mouth daily.   Marland Kitchen senna-docusate (SENNA S) 8.6-50 MG tablet Take 2 tablets by mouth 2 (two) times daily. May reduce to 2 tab po HS once bowel movements established. (Patient taking differently: Take 2 tablets by mouth as needed. )  . simvastatin (ZOCOR) 10 MG tablet Take 10 mg by mouth daily.   No facility-administered encounter medications on file as of 08/30/2018.     ALLERGIES:  No Known Allergies   PHYSICAL EXAM:  ECOG Performance status: 1  Vitals:   08/30/18 1012  BP: (!) 147/69  Pulse: 63  Resp: 18  Temp: 97.9 F (36.6 C)  SpO2: 98%   Filed Weights   08/30/18 1012  Weight: 129 lb 1.6 oz (58.6 kg)    Physical Exam  Constitutional: She appears well-developed and well-nourished.  Cardiovascular: Regular rhythm.  Pulmonary/Chest: Breath sounds normal.  Abdominal: Soft. She exhibits no distension and no mass. There is no tenderness.  Musculoskeletal: She exhibits no edema.  Skin: No rash noted.  No palpable lymphadenopathy in the neck, axilla or inguinal region.   LABORATORY DATA:  I have reviewed the labs as listed.  CBC    Component Value Date/Time   WBC 7.1 08/27/2018 0850   RBC 4.49 08/27/2018 0850   HGB 12.8 08/27/2018 0850   HGB 11.6 02/28/2018 0824   HCT 41.1 08/27/2018 0850   PLT 155 08/27/2018 0850   PLT 220 02/28/2018 0824   MCV 91.5 08/27/2018 0850   MCH 28.5 08/27/2018 0850   MCHC 31.1 08/27/2018 0850   RDW 13.8 08/27/2018 0850   LYMPHSABS 2.2 08/27/2018 0850   MONOABS 0.6 08/27/2018 0850   EOSABS 0.1 08/27/2018 0850   BASOSABS 0.1 08/27/2018 0850   CMP Latest Ref Rng & Units 08/27/2018 07/01/2018 05/09/2018  Glucose 70 - 99 mg/dL 95 167(H) 107(H)  BUN 8 - 23 mg/dL '15 15 21  '$ Creatinine 0.44  - 1.00 mg/dL 0.88 0.88 0.89  Sodium 135 - 145 mmol/L 140 143 142  Potassium 3.5 - 5.1 mmol/L 3.6 3.8 3.8  Chloride 98 - 111 mmol/L 104 104 108  CO2 22 - 32 mmol/L '29 30 27  '$ Calcium 8.9 - 10.3 mg/dL 9.1 9.5 9.2  Total Protein 6.5 - 8.1 g/dL 6.7 6.7 6.4(L)  Total Bilirubin 0.3 - 1.2 mg/dL 0.9 0.8 0.7  Alkaline Phos 38 - 126 U/L 85 75 78  AST 15 - 41 U/L 33 28 31  ALT 0 - 44 U/L '24 24 27       '$ DIAGNOSTIC IMAGING:  I have independently reviewed images of the CT scan dated 08/27/2018 and discussed with the patient.     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  High-grade follicular lymphoma: - Presentation with left inguinal lymph node enlargement, no B symptoms.  Initial needle biopsy on 11/08/2017 consistent with non-Hodgkin's lymphoma. -Excisional biopsy on 11/27/2017 shows high-grade follicular lymphoma (grade 3A) - She was treated with 4 cycles of R-CHOP from 12/28/2017 through 02/28/2018.  She had breathing difficulty after second and third treatments. -Hence her chemotherapy was discontinued after cycle 4.  She was started on maintenance rituximab on 05/01/2018.  She has tolerated it very well. -PET CT scan in May 2019 showed complete response in the inguinal and retroperitoneal areas.  There was mild activity in the hilar regions.  High-resolution CT scan on 04/15/2018 of the chest did not show any adenopathy. - She transferred her care to [place] as she lives in [place].  She has been tolerating maintenance rituximab every 2 months very well. - I have reviewed results of the CT scan CAP dated 08/27/2018 which shows no pathological adenopathy or splenomegaly.  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 - Physical examination today did not reveal any significant abnormalities. -She may proceed with her maintenance rituximab today and in 2 months.  I will see her  back  in 4 months for follow-up. -I plan to space out her CT scans every 6 months.      Orders placed this encounter:  Orders Placed This Encounter  Procedures  . CBC with Differential  . Comprehensive metabolic panel  . Lactate dehydrogenase      Derek Jack, MD [place] [phone]

## 2018-08-30 NOTE — Assessment & Plan Note (Signed)
1.  High-grade follicular lymphoma: - Presentation with left inguinal lymph node enlargement, no B symptoms.  Initial needle biopsy on 11/08/2017 consistent with non-Hodgkin's lymphoma. -Excisional biopsy on 11/27/2017 shows high-grade follicular lymphoma (grade 3A) - She was treated with 4 cycles of R-CHOP from 12/28/2017 through 02/28/2018.  She had breathing difficulty after second and third treatments. -Hence her chemotherapy was discontinued after cycle 4.  She was started on maintenance rituximab on 05/01/2018.  She has tolerated it very well. -PET CT scan in May 2019 showed complete response in the inguinal and retroperitoneal areas.  There was mild activity in the hilar regions.  High-resolution CT scan on 04/15/2018 of the chest did not show any adenopathy. - She transferred her care to [place] as she lives in [place].  She has been tolerating maintenance rituximab every 2 months very well. - I have reviewed results of the CT scan CAP dated 08/27/2018 which shows no pathological adenopathy or splenomegaly.  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 - Physical examination today did not reveal any significant abnormalities. -She may proceed with her maintenance rituximab today and in 2 months.  I will see her back in 4 months for follow-up. -I plan to space out her CT scans every 6 months.

## 2018-08-30 NOTE — Progress Notes (Signed)
Alicia [name] tolerated Rituxan infusion well without complaints or incident. Labs reviewed with and pt seen by Dr. Delton Coombes prior to administering this medication. VSS upon discharge. Pt discharged self ambulatory in satisfactory condition

## 2018-08-30 NOTE — Patient Instructions (Signed)
[facility] Discharge Instructions for Patients Receiving Chemotherapy   Beginning January 23rd 2017 lab work for the Cancer Center will be done in the  Main lab at [facility] on 1st floor. If you have a lab appointment with the Cancer Center please come in thru the  Main Entrance and check in at the main information desk   Today you received the following chemotherapy agents Rituxan. Follow-up as scheduled. Call clinic for any questions or concerns  To help prevent nausea and vomiting after your treatment, we encourage you to take your nausea medication   If you develop nausea and vomiting, or diarrhea that is not controlled by your medication, call the clinic.  The clinic phone number is [phone]. Office hours are Monday-Friday 8:30am-5:00pm.  BELOW ARE SYMPTOMS THAT SHOULD BE REPORTED IMMEDIATELY:  *FEVER GREATER THAN 101.0 F  *CHILLS WITH OR WITHOUT FEVER  NAUSEA AND VOMITING THAT IS NOT CONTROLLED WITH YOUR NAUSEA MEDICATION  *UNUSUAL SHORTNESS OF BREATH  *UNUSUAL BRUISING OR BLEEDING  TENDERNESS IN MOUTH AND THROAT WITH OR WITHOUT PRESENCE OF ULCERS  *URINARY PROBLEMS  *BOWEL PROBLEMS  UNUSUAL RASH Items with * indicate a potential emergency and should be followed up as soon as possible. If you have an emergency after office hours please contact your primary care physician or go to the nearest emergency department.  Please call the clinic during office hours if you have any questions or concerns.   You may also contact the Patient Navigator at [phone] should you have any questions or need assistance in obtaining follow up care.      Resources For Cancer Patients and their Caregivers ? American Cancer Society: Can assist with transportation, wigs, general needs, runs Look Good Feel Better.        1-888-227-6333 ? Cancer Care: Provides financial assistance, online support groups, medication/co-pay assistance.  1-800-813-HOPE  (4673) ? [facility] Assists [place] cancer patients and their families through emotional , educational and financial support.  336-427-4357 ? [place] DSS Where to apply for food stamps, Medicaid and utility assistance. 336-342-1394 ? [facility]: Transportation to medical appointments. 336-347-2287 ? Social Security Administration: May apply for disability if have a Stage IV cancer. 336-342-7796 1-800-772-1213 ? [place] Aging, Disability and Transit Services: Assists with nutrition, care and transit needs. 336-349-2343         

## 2018-09-23 ENCOUNTER — Other Ambulatory Visit: Payer: Medicare HMO

## 2018-09-23 ENCOUNTER — Ambulatory Visit: Payer: Medicare HMO

## 2018-09-23 ENCOUNTER — Ambulatory Visit: Payer: Medicare HMO | Admitting: Hematology

## 2018-10-29 ENCOUNTER — Other Ambulatory Visit (HOSPITAL_COMMUNITY): Payer: Self-pay

## 2018-10-29 DIAGNOSIS — C8238 Follicular lymphoma grade IIIa, lymph nodes of multiple sites: Secondary | ICD-10-CM

## 2018-10-30 ENCOUNTER — Other Ambulatory Visit (HOSPITAL_COMMUNITY): Payer: Medicare HMO

## 2018-10-30 ENCOUNTER — Inpatient Hospital Stay (HOSPITAL_COMMUNITY): Payer: Medicare HMO

## 2018-10-30 ENCOUNTER — Ambulatory Visit (HOSPITAL_COMMUNITY): Payer: Medicare HMO

## 2018-10-30 ENCOUNTER — Inpatient Hospital Stay (HOSPITAL_COMMUNITY): Payer: Medicare HMO | Attending: Internal Medicine

## 2018-10-30 ENCOUNTER — Encounter (HOSPITAL_COMMUNITY): Payer: Self-pay

## 2018-10-30 VITALS — BP 111/61 | HR 58 | Temp 98.0°F | Resp 16 | Wt 130.5 lb

## 2018-10-30 DIAGNOSIS — C8238 Follicular lymphoma grade IIIa, lymph nodes of multiple sites: Secondary | ICD-10-CM

## 2018-10-30 DIAGNOSIS — Z7189 Other specified counseling: Secondary | ICD-10-CM

## 2018-10-30 DIAGNOSIS — Z5112 Encounter for antineoplastic immunotherapy: Secondary | ICD-10-CM | POA: Insufficient documentation

## 2018-10-30 LAB — CBC WITH DIFFERENTIAL/PLATELET
ABS IMMATURE GRANULOCYTES: 0.01 10*3/uL (ref 0.00–0.07)
BASOS PCT: 1 %
Basophils Absolute: 0.1 10*3/uL (ref 0.0–0.1)
EOS ABS: 0.1 10*3/uL (ref 0.0–0.5)
Eosinophils Relative: 1 %
HCT: 41.7 % (ref 36.0–46.0)
Hemoglobin: 13 g/dL (ref 12.0–15.0)
Immature Granulocytes: 0 %
Lymphocytes Relative: 38 %
Lymphs Abs: 1.9 10*3/uL (ref 0.7–4.0)
MCH: 28.8 pg (ref 26.0–34.0)
MCHC: 31.2 g/dL (ref 30.0–36.0)
MCV: 92.5 fL (ref 80.0–100.0)
MONO ABS: 0.7 10*3/uL (ref 0.1–1.0)
Monocytes Relative: 14 %
Neutro Abs: 2.3 10*3/uL (ref 1.7–7.7)
Neutrophils Relative %: 46 %
PLATELETS: 199 10*3/uL (ref 150–400)
RBC: 4.51 MIL/uL (ref 3.87–5.11)
RDW: 13.2 % (ref 11.5–15.5)
WBC: 4.9 10*3/uL (ref 4.0–10.5)
nRBC: 0 % (ref 0.0–0.2)

## 2018-10-30 LAB — COMPREHENSIVE METABOLIC PANEL
ALT: 19 U/L (ref 0–44)
ANION GAP: 9 (ref 5–15)
AST: 26 U/L (ref 15–41)
Albumin: 3.9 g/dL (ref 3.5–5.0)
Alkaline Phosphatase: 64 U/L (ref 38–126)
BUN: 19 mg/dL (ref 8–23)
CHLORIDE: 104 mmol/L (ref 98–111)
CO2: 26 mmol/L (ref 22–32)
Calcium: 9.3 mg/dL (ref 8.9–10.3)
Creatinine, Ser: 0.96 mg/dL (ref 0.44–1.00)
GFR calc non Af Amer: 57 mL/min — ABNORMAL LOW (ref >60)
Glucose, Bld: 93 mg/dL (ref 70–99)
POTASSIUM: 3.8 mmol/L (ref 3.5–5.1)
SODIUM: 139 mmol/L (ref 135–145)
Total Bilirubin: 0.7 mg/dL (ref 0.3–1.2)
Total Protein: 6.3 g/dL — ABNORMAL LOW (ref 6.5–8.1)

## 2018-10-30 MED ORDER — SODIUM CHLORIDE 0.9 % IV SOLN
Freq: Once | INTRAVENOUS | Status: AC
  Administered 2018-10-30: 11:00:00 via INTRAVENOUS

## 2018-10-30 MED ORDER — DIPHENHYDRAMINE HCL 25 MG PO CAPS
ORAL_CAPSULE | ORAL | Status: AC
  Filled 2018-10-30: qty 2

## 2018-10-30 MED ORDER — SODIUM CHLORIDE 0.9 % IV SOLN
375.0000 mg/m2 | Freq: Once | INTRAVENOUS | Status: AC
  Administered 2018-10-30: 600 mg via INTRAVENOUS
  Filled 2018-10-30: qty 10

## 2018-10-30 MED ORDER — ACETAMINOPHEN 325 MG PO TABS
ORAL_TABLET | ORAL | Status: AC
  Filled 2018-10-30: qty 2

## 2018-10-30 MED ORDER — HEPARIN SOD (PORK) LOCK FLUSH 100 UNIT/ML IV SOLN
500.0000 [IU] | Freq: Once | INTRAVENOUS | Status: AC | PRN
  Administered 2018-10-30: 500 [IU]

## 2018-10-30 MED ORDER — ACETAMINOPHEN 325 MG PO TABS
650.0000 mg | ORAL_TABLET | Freq: Once | ORAL | Status: AC
  Administered 2018-10-30: 650 mg via ORAL

## 2018-10-30 MED ORDER — SODIUM CHLORIDE 0.9 % IV SOLN
10.0000 mg | Freq: Once | INTRAVENOUS | Status: AC
  Administered 2018-10-30: 10 mg via INTRAVENOUS
  Filled 2018-10-30: qty 1

## 2018-10-30 MED ORDER — SODIUM CHLORIDE 0.9% FLUSH
10.0000 mL | INTRAVENOUS | Status: DC | PRN
  Administered 2018-10-30: 10 mL
  Filled 2018-10-30: qty 10

## 2018-10-30 MED ORDER — DIPHENHYDRAMINE HCL 25 MG PO CAPS
50.0000 mg | ORAL_CAPSULE | Freq: Once | ORAL | Status: AC
  Administered 2018-10-30: 50 mg via ORAL

## 2018-10-30 NOTE — Patient Instructions (Signed)
[facility] Discharge Instructions for Patients Receiving Chemotherapy   Beginning January 23rd 2017 lab work for the Cancer Center will be done in the  Main lab at [place] [facility] on 1st floor. If you have a lab appointment with the Cancer Center please come in thru the  Main Entrance and check in at the main information desk   Today you received the following chemotherapy agents Rituxan. Follow-up as scheduled. Call clinic for any questions or concerns  To help prevent nausea and vomiting after your treatment, we encourage you to take your nausea medication   If you develop nausea and vomiting, or diarrhea that is not controlled by your medication, call the clinic.  The clinic phone number is [phone]. Office hours are Monday-Friday 8:30am-5:00pm.  BELOW ARE SYMPTOMS THAT SHOULD BE REPORTED IMMEDIATELY:  *FEVER GREATER THAN 101.0 F  *CHILLS WITH OR WITHOUT FEVER  NAUSEA AND VOMITING THAT IS NOT CONTROLLED WITH YOUR NAUSEA MEDICATION  *UNUSUAL SHORTNESS OF BREATH  *UNUSUAL BRUISING OR BLEEDING  TENDERNESS IN MOUTH AND THROAT WITH OR WITHOUT PRESENCE OF ULCERS  *URINARY PROBLEMS  *BOWEL PROBLEMS  UNUSUAL RASH Items with * indicate a potential emergency and should be followed up as soon as possible. If you have an emergency after office hours please contact your primary care physician or go to the nearest emergency department.  Please call the clinic during office hours if you have any questions or concerns.   You may also contact the Patient Navigator at [phone] should you have any questions or need assistance in obtaining follow up care.      Resources For Cancer Patients and their Caregivers ? American Cancer Society: Can assist with transportation, wigs, general needs, runs Look Good Feel Better.        1-888-227-6333 ? Cancer Care: Provides financial assistance, online support groups, medication/co-pay assistance.  1-800-813-HOPE  (4673) ? [facility] Assists [place] cancer patients and their families through emotional , educational and financial support.  336-427-4357 ? [place] DSS Where to apply for food stamps, Medicaid and utility assistance. 336-342-1394 ? [facility]: Transportation to medical appointments. 336-347-2287 ? Social Security Administration: May apply for disability if have a Stage IV cancer. 336-342-7796 1-800-772-1213 ? [place] Aging, Disability and Transit Services: Assists with nutrition, care and transit needs. 336-349-2343         

## 2018-10-30 NOTE — Progress Notes (Signed)
Alicia [name] tolerated Rituxan infusion well without complaints or incident. Labs reviewed prior to administering this medication. VSS upon discharge. Pt discharged self ambulatory in satisfactory condition

## 2018-12-04 DIAGNOSIS — R69 Illness, unspecified: Secondary | ICD-10-CM | POA: Diagnosis not present

## 2018-12-30 ENCOUNTER — Ambulatory Visit (HOSPITAL_COMMUNITY): Payer: Medicare HMO | Admitting: Hematology

## 2018-12-30 ENCOUNTER — Ambulatory Visit (HOSPITAL_COMMUNITY): Payer: Medicare HMO

## 2018-12-30 ENCOUNTER — Other Ambulatory Visit: Payer: Self-pay

## 2018-12-30 ENCOUNTER — Inpatient Hospital Stay (HOSPITAL_COMMUNITY): Payer: Medicare HMO | Attending: Internal Medicine | Admitting: Hematology

## 2018-12-30 ENCOUNTER — Other Ambulatory Visit (HOSPITAL_COMMUNITY): Payer: Medicare HMO

## 2018-12-30 ENCOUNTER — Encounter (HOSPITAL_COMMUNITY): Payer: Self-pay

## 2018-12-30 ENCOUNTER — Inpatient Hospital Stay (HOSPITAL_COMMUNITY): Payer: Medicare HMO

## 2018-12-30 ENCOUNTER — Encounter (HOSPITAL_COMMUNITY): Payer: Self-pay | Admitting: Hematology

## 2018-12-30 VITALS — BP 123/64 | HR 66 | Temp 97.8°F | Resp 16 | Wt 130.8 lb

## 2018-12-30 VITALS — BP 112/72 | HR 64 | Temp 97.9°F | Resp 18

## 2018-12-30 DIAGNOSIS — C8238 Follicular lymphoma grade IIIa, lymph nodes of multiple sites: Secondary | ICD-10-CM

## 2018-12-30 DIAGNOSIS — J849 Interstitial pulmonary disease, unspecified: Secondary | ICD-10-CM | POA: Diagnosis not present

## 2018-12-30 DIAGNOSIS — Z5112 Encounter for antineoplastic immunotherapy: Secondary | ICD-10-CM | POA: Insufficient documentation

## 2018-12-30 DIAGNOSIS — Z7189 Other specified counseling: Secondary | ICD-10-CM

## 2018-12-30 LAB — CBC WITH DIFFERENTIAL/PLATELET
Abs Immature Granulocytes: 0.03 10*3/uL (ref 0.00–0.07)
BASOS PCT: 1 %
Basophils Absolute: 0.1 10*3/uL (ref 0.0–0.1)
Eosinophils Absolute: 0.1 10*3/uL (ref 0.0–0.5)
Eosinophils Relative: 1 %
HCT: 43.3 % (ref 36.0–46.0)
Hemoglobin: 14 g/dL (ref 12.0–15.0)
Immature Granulocytes: 0 %
Lymphocytes Relative: 27 %
Lymphs Abs: 2 10*3/uL (ref 0.7–4.0)
MCH: 29.1 pg (ref 26.0–34.0)
MCHC: 32.3 g/dL (ref 30.0–36.0)
MCV: 90 fL (ref 80.0–100.0)
MONOS PCT: 10 %
Monocytes Absolute: 0.7 10*3/uL (ref 0.1–1.0)
Neutro Abs: 4.5 10*3/uL (ref 1.7–7.7)
Neutrophils Relative %: 61 %
Platelets: 177 10*3/uL (ref 150–400)
RBC: 4.81 MIL/uL (ref 3.87–5.11)
RDW: 13.7 % (ref 11.5–15.5)
WBC: 7.4 10*3/uL (ref 4.0–10.5)
nRBC: 0 % (ref 0.0–0.2)

## 2018-12-30 LAB — COMPREHENSIVE METABOLIC PANEL
ALT: 18 U/L (ref 0–44)
AST: 23 U/L (ref 15–41)
Albumin: 4 g/dL (ref 3.5–5.0)
Alkaline Phosphatase: 73 U/L (ref 38–126)
Anion gap: 9 (ref 5–15)
BUN: 18 mg/dL (ref 8–23)
CO2: 27 mmol/L (ref 22–32)
Calcium: 9.2 mg/dL (ref 8.9–10.3)
Chloride: 104 mmol/L (ref 98–111)
Creatinine, Ser: 0.89 mg/dL (ref 0.44–1.00)
GFR calc non Af Amer: >60 mL/min (ref >60)
Glucose, Bld: 95 mg/dL (ref 70–99)
POTASSIUM: 3.8 mmol/L (ref 3.5–5.1)
Sodium: 140 mmol/L (ref 135–145)
Total Bilirubin: 0.9 mg/dL (ref 0.3–1.2)
Total Protein: 6.5 g/dL (ref 6.5–8.1)

## 2018-12-30 LAB — LACTATE DEHYDROGENASE: LDH: 157 U/L (ref 98–192)

## 2018-12-30 MED ORDER — DIPHENHYDRAMINE HCL 25 MG PO CAPS
ORAL_CAPSULE | ORAL | Status: AC
  Filled 2018-12-30: qty 2

## 2018-12-30 MED ORDER — SODIUM CHLORIDE 0.9% FLUSH
10.0000 mL | INTRAVENOUS | Status: DC | PRN
  Administered 2018-12-30: 10 mL
  Filled 2018-12-30: qty 10

## 2018-12-30 MED ORDER — SODIUM CHLORIDE 0.9 % IV SOLN
10.0000 mg | Freq: Once | INTRAVENOUS | Status: AC
  Administered 2018-12-30: 10 mg via INTRAVENOUS
  Filled 2018-12-30: qty 1

## 2018-12-30 MED ORDER — SODIUM CHLORIDE 0.9 % IV SOLN
Freq: Once | INTRAVENOUS | Status: AC
  Administered 2018-12-30: 11:00:00 via INTRAVENOUS

## 2018-12-30 MED ORDER — ACETAMINOPHEN 325 MG PO TABS
650.0000 mg | ORAL_TABLET | Freq: Once | ORAL | Status: AC
  Administered 2018-12-30: 650 mg via ORAL

## 2018-12-30 MED ORDER — SODIUM CHLORIDE 0.9 % IV SOLN
375.0000 mg/m2 | Freq: Once | INTRAVENOUS | Status: AC
  Administered 2018-12-30: 600 mg via INTRAVENOUS
  Filled 2018-12-30: qty 10

## 2018-12-30 MED ORDER — DIPHENHYDRAMINE HCL 25 MG PO CAPS
50.0000 mg | ORAL_CAPSULE | Freq: Once | ORAL | Status: AC
  Administered 2018-12-30: 50 mg via ORAL

## 2018-12-30 MED ORDER — ACETAMINOPHEN 325 MG PO TABS
ORAL_TABLET | ORAL | Status: AC
  Filled 2018-12-30: qty 2

## 2018-12-30 MED ORDER — HEPARIN SOD (PORK) LOCK FLUSH 100 UNIT/ML IV SOLN
500.0000 [IU] | Freq: Once | INTRAVENOUS | Status: AC | PRN
  Administered 2018-12-30: 500 [IU]

## 2018-12-30 NOTE — Progress Notes (Signed)
[address] [place], [place] [address]   CLINIC:  Medical Oncology/Hematology  PCP:  Celene Squibb, MD [place] [address] [phone]   REASON FOR VISIT:  Follow-up for  high-grade follicular lymphoma.  CURRENT THERAPY: Maintenance rituximab.   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 ([place])    12/28/2017 -  Chemotherapy    The patient had DOXOrubicin (ADRIAMYCIN) chemo injection 84 mg, 50 mg/m2 = 84 mg, Intravenous,  Once, 4 of 4 cycles Administration: 84 mg (12/28/2017), 84 mg (01/18/2018), 84 mg (02/08/2018), 84 mg (02/28/2018) palonosetron (ALOXI) injection 0.25 mg, 0.25 mg, Intravenous,  Once, 4 of 4 cycles Administration: 0.25 mg (12/28/2017), 0.25 mg (01/18/2018), 0.25 mg (02/08/2018), 0.25 mg (02/28/2018) pegfilgrastim (NEULASTA) injection 6 mg, 6 mg, Subcutaneous, Once, 3 of 3 cycles Administration: 6 mg (12/31/2017), 6 mg (01/21/2018), 6 mg (02/11/2018) pegfilgrastim (NEULASTA ONPRO KIT) injection 6 mg, 6 mg, Subcutaneous, Once, [name], 2 mg, Intravenous,  Once, 4 of 4 cycles Administration: 2 mg (12/28/2017), 2 mg (01/18/2018), 2 mg (02/08/2018), 2 mg (02/28/2018) riTUXimab (RITUXAN) 600 mg in sodium chloride 0.9 % 250 mL (1.9355 mg/mL) infusion, 375 mg/m2 = 600 mg, Intravenous,  Once, [name], Cycle 6, Reason: Other (see comments)) Administration: 600 mg (12/28/2017) cyclophosphamide (CYTOXAN) 1,[name], 750 mg/m2 = 1,260 mg, Intravenous,  Once, 4 of 4 cycles Administration: 1,260 mg (12/28/2017), 1,260 mg (01/18/2018), 1,260 mg (02/08/2018), 1,260 mg (02/28/2018) riTUXimab (RITUXAN) 600 mg in sodium chloride  0.9 % 190 mL infusion, 375 mg/m2 = 600 mg, Intravenous,  Once, 8 of 12 cycles Dose modification: 375 mg/m2 (original dose 375 mg/m2, Cycle 7) Administration: 600 mg (01/18/2018), 600 mg (02/08/2018), 600 mg (02/28/2018), 600 mg (05/01/2018), 600 mg (07/01/2018), 600 mg (08/30/2018), 600 mg (10/30/2018) Tbo-Filgrastim (GRANIX) injection 480 mcg, 480 mcg (100 % of original dose 480 mcg), Subcutaneous,  Once, [name], Cycle 4)  for chemotherapy treatment.       CANCER STAGING: Cancer Staging No matching staging information was found for the patient.   INTERVAL HISTORY:  Ms. Broyles 78 y.o. female returns for routine follow-up and consideration for next cycle of chemotherapy. She is here today by herself. She states that she has had some side effects from this last treatment. She states that she has experienced 2 episodes of shortness of breath yesterday.  Denies any nausea, vomiting, or diarrhea. Had not noticed any recent bleeding such as epistaxis, hematuria or hematochezia. Denies recent chest pain on exertion, pre-syncopal episodes, or palpitations. Denies any numbness or tingling in hands or feet. Denies any recent fevers, infections, or recent hospitalizations. Patient reports appetite at 100% and energy level at 75%.     REVIEW OF SYSTEMS:  Review of Systems  Constitutional: Positive for fatigue.  Respiratory: Positive for shortness of breath.   Genitourinary: Positive for frequency.   Neurological: Positive for numbness.  Psychiatric/Behavioral: Positive for sleep disturbance.     PAST MEDICAL/SURGICAL HISTORY:  Past Medical History:  Diagnosis Date  . Arthritis    back and hips  .  GERD (gastroesophageal reflux disease)    TUMS  . Hypercholesterolemia   . Hypertension   . Non Hodgkin's lymphoma [facility])    Past Surgical History:  Procedure Laterality Date  . LYMPH NODE BIOPSY Left 11/27/2017   Procedure: LEFT INGUINAL LYMPH  NODE BIOPSY;  Surgeon: Stark Klein, MD;  Location: [facility];  Service: General;  Laterality: Left;  . PORTACATH PLACEMENT Left 12/12/2017   Procedure: INSERTION PORT-A-CATH;  Surgeon: Stark Klein, MD;  Location: [facility];  Service: General;  Laterality: Left;  . TONSILLECTOMY       SOCIAL HISTORY:  Social History   Socioeconomic History  . Marital status: Widowed    Spouse name: Not on file  . Number of children: Not on file  . Years of education: Not on file  . Highest education level: Not on file  Occupational History  . Not on file  Social Needs  . Financial resource strain: Not on file  . Food insecurity:    Worry: Not on file    Inability: Not on file  . Transportation needs:    Medical: Not on file    Non-medical: Not on file  Tobacco Use  . Smoking status: Never Smoker  . Smokeless tobacco: Never Used  Substance and Sexual Activity  . Alcohol use: No  . Drug use: No  . Sexual activity: Not on file  Lifestyle  . Physical activity:    Days per week: Not on file    Minutes per session: Not on file  . Stress: Not on file  Relationships  . Social connections:    Talks on phone: Not on file    Gets together: Not on file    Attends religious service: Not on file    Active member of club or organization: Not on file    Attends meetings of clubs or organizations: Not on file    Relationship status: Not on file  . Intimate partner violence:    Fear of current or ex partner: Not on file    Emotionally abused: Not on file    Physically abused: Not on file    Forced sexual activity: Not on file  Other Topics Concern  . Not on file  Social History Narrative  . Not on file    FAMILY HISTORY:  Family History  Problem Relation Age of Onset  . Lung cancer Mother   . Kidney cancer Father   . Alcoholism Brother   . Emphysema Maternal Aunt   . Cancer Maternal Uncle   . Cancer Maternal Uncle     CURRENT MEDICATIONS:  Outpatient Encounter  Medications as of 12/30/2018  Medication Sig  . naproxen sodium (ALEVE) 220 MG tablet Take 220 mg by mouth as needed.  Marland Kitchen acetaminophen (TYLENOL) 500 MG tablet Take 500 mg by mouth at bedtime as needed for moderate pain (takes 1/2 tablet at bedtime).   . bisoprolol-hydrochlorothiazide (ZIAC) 5-6.25 MG tablet Take 1 tablet by mouth 2 (two) times daily.  . diphenhydramine-acetaminophen (TYLENOL PM) 25-500 MG TABS tablet Take 1 tablet by mouth at bedtime as needed. Pt only takes a half tablet  . Multiple Vitamins-Minerals (PRESERVISION AREDS 2 PO) Take 2 capsules by mouth daily.  Marland Kitchen omega-3 acid ethyl esters (LOVAZA) 1 g capsule Take 1 g by mouth daily.   . simvastatin (ZOCOR) 10 MG tablet Take 10 mg by mouth daily.  . [DISCONTINUED] senna-docusate (SENNA S) 8.6-50 MG tablet Take 2 tablets by mouth 2 (two) times daily. May  reduce to 2 tab po HS once bowel movements established. (Patient not taking: Reported on 12/30/2018)   No facility-administered encounter medications on file as of 12/30/2018.     ALLERGIES:  No Known Allergies   PHYSICAL EXAM:  ECOG Performance status: 1  Vitals:   12/30/18 0849 12/30/18 0851  BP: 123/64 123/64  Pulse: 66 66  Resp: 16 16  Temp: 97.8 F (36.6 C) 97.8 F (36.6 C)  SpO2: 99% 99%   Filed Weights   12/30/18 0849 12/30/18 0851  Weight: 130 lb 13.6 oz (59.4 kg) 130 lb 12.8 oz (59.3 kg)    Physical Exam Constitutional:      Appearance: Normal appearance.  Cardiovascular:     Rate and Rhythm: Normal rate and regular rhythm.  Pulmonary:     Effort: Pulmonary effort is normal.     Breath sounds: Normal breath sounds.  Abdominal:     General: Bowel sounds are normal. There is no distension.     Palpations: Abdomen is soft.  Musculoskeletal:        General: No swelling.  Neurological:     General: No focal deficit present.     Mental Status: She is alert and oriented to person, place, and time.  Psychiatric:        Mood and Affect: Mood normal.         Behavior: Behavior normal.      LABORATORY DATA:  I have reviewed the labs as listed.  CBC    Component Value Date/Time   WBC 7.4 12/30/2018 0908   RBC 4.81 12/30/2018 0908   HGB 14.0 12/30/2018 0908   HGB 11.6 02/28/2018 0824   HCT 43.3 12/30/2018 0908   PLT 177 12/30/2018 0908   PLT 220 02/28/2018 0824   MCV 90.0 12/30/2018 0908   MCH 29.1 12/30/2018 0908   MCHC 32.3 12/30/2018 0908   RDW 13.7 12/30/2018 0908   LYMPHSABS 2.0 12/30/2018 0908   MONOABS 0.7 12/30/2018 0908   EOSABS 0.1 12/30/2018 0908   BASOSABS 0.1 12/30/2018 0908   CMP Latest Ref Rng & Units 12/30/2018 10/30/2018 08/27/2018  Glucose 70 - 99 mg/dL 95 93 95  BUN 8 - 23 mg/dL '18 19 15  '$ Creatinine 0.44 - 1.00 mg/dL 0.89 0.96 0.88  Sodium 135 - 145 mmol/L 140 139 140  Potassium 3.5 - 5.1 mmol/L 3.8 3.8 3.6  Chloride 98 - 111 mmol/L 104 104 104  CO2 22 - 32 mmol/L '27 26 29  '$ Calcium 8.9 - 10.3 mg/dL 9.2 9.3 9.1  Total Protein 6.5 - 8.1 g/dL 6.5 6.3(L) 6.7  Total Bilirubin 0.3 - 1.2 mg/dL 0.9 0.7 0.9  Alkaline Phos 38 - 126 U/L 73 64 85  AST 15 - 41 U/L 23 26 33  ALT 0 - 44 U/L '18 19 24       '$ DIAGNOSTIC IMAGING:  I have independently reviewed the scans and discussed with the patient.   I have reviewed Venita Lick LPN's note and agree with the documentation.  I personally performed a face-to-face visit, made revisions and my assessment and plan is as follows.    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  High-grade follicular lymphoma: - Presentation with left inguinal lymph node enlargement, no B symptoms.  Initial needle biopsy on 11/08/2017 consistent with non-Hodgkin's lymphoma. -Excisional biopsy on 11/27/2017 shows high-grade follicular lymphoma (grade 3A) - 4 cycles of R-CHOP from 12/28/2017 through 02/28/2018.  She had  breathing difficulty after second and third treatments. -Consider chemotherapy was discontinued after cycle 4.  She was  started on maintenance rituximab on 05/01/2018. -PET CT scan in May 2019 showed complete response in inguinal and retroperitoneal areas.  There is mild activity in the hilar regions.  High resolution CT on 04/15/2018 of the chest did not show any adenopathy. - She transferred her care to [place] as she lives in [place].  She has been tolerating maintenance rituximab every 2 months very well. - CT CAP on 08/27/2018 shows no pathological adenopathy or splenomegaly.  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  - Physical examination today did not reveal any adenopathy or splenomegaly. -We have reviewed her blood work.  LDH was normal. -She may proceed with her maintenance rituximab today.  I plan to repeat her CT CAP in 2 months and see her back.  2.  Interstitial lung disease: -She was evaluated by pulmonary and was thought to have interstitial lung disease. -She had 2 episodes of shortness of breath on exertion, much worse than her baseline in the last 2 months. -We will see her back after the CT scans.      Orders placed this encounter:  Orders Placed This Encounter  Procedures  . CT Abdomen Pelvis W Contrast  . CT Chest W Contrast  . CBC with Differential/Platelet  . Comprehensive metabolic panel  . Lactate dehydrogenase      Derek Jack, MD [place] [phone]

## 2018-12-30 NOTE — Progress Notes (Signed)
Pt presents today for Rituxan . VSS. Pt seen by Dr. Delton Coombes today. MAR reviewed and updated. Per [facility], "proceed with treatment."   Treatment given today per MD orders. Tolerated infusion without adverse affects. Vital signs stable. No complaints at this time. Discharged from clinic ambulatory. F/U with [facility] as scheduled.

## 2018-12-30 NOTE — Patient Instructions (Signed)
[facility] at [facility]  Discharge Instructions:   _______________________________________________________________  Thank you for choosing [facility] at [facility] to provide your oncology and hematology care.  To afford each patient quality time with our providers, please arrive at least 15 minutes before your scheduled appointment.  You need to re-schedule your appointment if you arrive 10 or more minutes late.  We strive to give you quality time with our providers, and arriving late affects you and other patients whose appointments are after yours.  Also, if you no show three or more times for appointments you may be dismissed from the clinic.  Again, thank you for choosing [facility] at [facility]. Our hope is that these requests will allow you access to exceptional care and in a timely manner. _______________________________________________________________  If you have questions after your visit, please contact our office at [phone] between the hours of 8:30 a.m. and 5:00 p.m. Voicemails left after 4:30 p.m. will not be returned until the following business day. _______________________________________________________________  For prescription refill requests, have your pharmacy contact our office. _______________________________________________________________  Recommendations made by the consultant and any test results will be sent to your referring physician. _______________________________________________________________ 

## 2018-12-30 NOTE — Assessment & Plan Note (Signed)
1.  High-grade follicular lymphoma: - Presentation with left inguinal lymph node enlargement, no B symptoms.  Initial needle biopsy on 11/08/2017 consistent with non-Hodgkin's lymphoma. -Excisional biopsy on 11/27/2017 shows high-grade follicular lymphoma (grade 3A) - 4 cycles of R-CHOP from 12/28/2017 through 02/28/2018.  She had breathing difficulty after second and third treatments. -Consider chemotherapy was discontinued after cycle 4.  She was started on maintenance rituximab on 05/01/2018. -PET CT scan in May 2019 showed complete response in inguinal and retroperitoneal areas.  There is mild activity in the hilar regions.  High resolution CT on 04/15/2018 of the chest did not show any adenopathy. - She transferred her care to [place] as she lives in [place].  She has been tolerating maintenance rituximab every 2 months very well. - CT CAP on 08/27/2018 shows no pathological adenopathy or splenomegaly.  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  - Physical examination today did not reveal any adenopathy or splenomegaly. -We have reviewed her blood work.  LDH was normal. -She may proceed with her maintenance rituximab today.  I plan to repeat her CT CAP in 2 months and see her back.  2.  Interstitial lung disease: -She was evaluated by pulmonary and was thought to have interstitial lung disease. -She had 2 episodes of shortness of breath on exertion, much worse than her baseline in the last 2 months. -We will see her back after the CT scans.

## 2018-12-30 NOTE — Patient Instructions (Addendum)
[place] at [facility] Discharge Instructions  You were seen today by Dr. Delton Coombes. He went over your recent results. He would like you to continue taking your treatments as scheduled. He will schedule follow up scans.  He will see you back in 2 MONTHS after scans for labs and follow up.   Thank you for choosing [place] at [facility] to provide your oncology and hematology care.  To afford each patient quality time with our provider, please arrive at least 15 minutes before your scheduled appointment time.   If you have a lab appointment with the [facility] please come in thru the  Main Entrance and check in at the main information desk  You need to re-schedule your appointment should you arrive 10 or more minutes late.  We strive to give you quality time with our providers, and arriving late affects you and other patients whose appointments are after yours.  Also, if you no show three or more times for appointments you may be dismissed from the clinic at the providers discretion.     Again, thank you for choosing [facility].  Our hope is that these requests will decrease the amount of time that you wait before being seen by our physicians.       _____________________________________________________________  Should you have questions after your visit to [facility], please contact our office at [phone] between the hours of 8:00 a.m. and 4:30 p.m.  Voicemails left after 4:00 p.m. will not be returned until the following business day.  For prescription refill requests, have your pharmacy contact our office and allow 72 hours.    Cancer Center Support Programs:   > Cancer Support Group  2nd Tuesday of the month 1pm-2pm, Journey Room

## 2018-12-31 DIAGNOSIS — M25561 Pain in right knee: Secondary | ICD-10-CM | POA: Diagnosis not present

## 2019-01-06 ENCOUNTER — Ambulatory Visit (HOSPITAL_COMMUNITY): Payer: Medicare HMO

## 2019-01-06 ENCOUNTER — Other Ambulatory Visit (HOSPITAL_COMMUNITY): Payer: Medicare HMO

## 2019-01-06 DIAGNOSIS — G4700 Insomnia, unspecified: Secondary | ICD-10-CM | POA: Diagnosis not present

## 2019-01-06 DIAGNOSIS — I10 Essential (primary) hypertension: Secondary | ICD-10-CM | POA: Diagnosis not present

## 2019-01-06 DIAGNOSIS — C8590 Non-Hodgkin lymphoma, unspecified, unspecified site: Secondary | ICD-10-CM | POA: Diagnosis not present

## 2019-01-06 DIAGNOSIS — J849 Interstitial pulmonary disease, unspecified: Secondary | ICD-10-CM | POA: Diagnosis not present

## 2019-01-06 DIAGNOSIS — R7301 Impaired fasting glucose: Secondary | ICD-10-CM | POA: Diagnosis not present

## 2019-01-06 DIAGNOSIS — I77811 Abdominal aortic ectasia: Secondary | ICD-10-CM | POA: Diagnosis not present

## 2019-01-06 DIAGNOSIS — H3530 Unspecified macular degeneration: Secondary | ICD-10-CM | POA: Diagnosis not present

## 2019-01-06 DIAGNOSIS — E782 Mixed hyperlipidemia: Secondary | ICD-10-CM | POA: Diagnosis not present

## 2019-01-20 ENCOUNTER — Other Ambulatory Visit (HOSPITAL_COMMUNITY): Payer: Medicare HMO

## 2019-01-22 ENCOUNTER — Ambulatory Visit (HOSPITAL_COMMUNITY): Payer: Medicare HMO | Admitting: Hematology

## 2019-01-27 DIAGNOSIS — Z0000 Encounter for general adult medical examination without abnormal findings: Secondary | ICD-10-CM | POA: Diagnosis not present

## 2019-01-30 ENCOUNTER — Ambulatory Visit: Payer: Medicare HMO | Admitting: Orthopaedic Surgery

## 2019-02-25 ENCOUNTER — Ambulatory Visit (HOSPITAL_COMMUNITY): Payer: Medicare HMO

## 2019-02-25 ENCOUNTER — Other Ambulatory Visit: Payer: Self-pay

## 2019-02-25 ENCOUNTER — Ambulatory Visit (HOSPITAL_COMMUNITY)
Admission: RE | Admit: 2019-02-25 | Discharge: 2019-02-25 | Disposition: A | Discharge status: Home / Self Care | Payer: Medicare HMO | Source: Ambulatory Visit | Attending: Hematology | Admitting: Hematology

## 2019-02-25 DIAGNOSIS — I77810 Thoracic aortic ectasia: Secondary | ICD-10-CM | POA: Diagnosis not present

## 2019-02-25 DIAGNOSIS — C8220 Follicular lymphoma grade III, unspecified, unspecified site: Secondary | ICD-10-CM | POA: Diagnosis not present

## 2019-02-25 DIAGNOSIS — C8238 Follicular lymphoma grade IIIa, lymph nodes of multiple sites: Secondary | ICD-10-CM | POA: Insufficient documentation

## 2019-02-25 DIAGNOSIS — C8290 Follicular lymphoma, unspecified, unspecified site: Secondary | ICD-10-CM | POA: Diagnosis not present

## 2019-02-25 LAB — POCT I-STAT CREATININE: Creatinine, Ser: 0.9 mg/dL (ref 0.44–1.00)

## 2019-02-25 MED ORDER — IOHEXOL 300 MG/ML  SOLN
100.0000 mL | Freq: Once | INTRAMUSCULAR | Status: AC | PRN
  Administered 2019-02-25: 100 mL via INTRAVENOUS

## 2019-03-03 ENCOUNTER — Inpatient Hospital Stay (HOSPITAL_COMMUNITY): Payer: Medicare HMO | Attending: Internal Medicine | Admitting: Hematology

## 2019-03-03 ENCOUNTER — Encounter (HOSPITAL_COMMUNITY): Payer: Self-pay | Admitting: Hematology

## 2019-03-03 ENCOUNTER — Other Ambulatory Visit: Payer: Self-pay

## 2019-03-03 ENCOUNTER — Inpatient Hospital Stay (HOSPITAL_COMMUNITY): Payer: Medicare HMO

## 2019-03-03 VITALS — BP 120/62 | HR 60 | Temp 97.0°F | Resp 18

## 2019-03-03 DIAGNOSIS — Z5112 Encounter for antineoplastic immunotherapy: Secondary | ICD-10-CM | POA: Diagnosis not present

## 2019-03-03 DIAGNOSIS — C8238 Follicular lymphoma grade IIIa, lymph nodes of multiple sites: Secondary | ICD-10-CM | POA: Diagnosis not present

## 2019-03-03 DIAGNOSIS — R0609 Other forms of dyspnea: Secondary | ICD-10-CM

## 2019-03-03 DIAGNOSIS — Z7189 Other specified counseling: Secondary | ICD-10-CM

## 2019-03-03 LAB — COMPREHENSIVE METABOLIC PANEL
ALT: 18 U/L (ref 0–44)
AST: 23 U/L (ref 15–41)
Albumin: 3.7 g/dL (ref 3.5–5.0)
Alkaline Phosphatase: 81 U/L (ref 38–126)
Anion gap: 9 (ref 5–15)
BUN: 18 mg/dL (ref 8–23)
CO2: 29 mmol/L (ref 22–32)
Calcium: 8.9 mg/dL (ref 8.9–10.3)
Chloride: 104 mmol/L (ref 98–111)
Creatinine, Ser: 0.96 mg/dL (ref 0.44–1.00)
GFR calc Af Amer: >60 mL/min (ref >60)
GFR calc non Af Amer: 57 mL/min — ABNORMAL LOW (ref >60)
Glucose, Bld: 100 mg/dL — ABNORMAL HIGH (ref 70–99)
Potassium: 3.4 mmol/L — ABNORMAL LOW (ref 3.5–5.1)
Sodium: 142 mmol/L (ref 135–145)
Total Bilirubin: 0.6 mg/dL (ref 0.3–1.2)
Total Protein: 6 g/dL — ABNORMAL LOW (ref 6.5–8.1)

## 2019-03-03 LAB — CBC WITH DIFFERENTIAL/PLATELET
Abs Immature Granulocytes: 0.02 10*3/uL (ref 0.00–0.07)
Basophils Absolute: 0 10*3/uL (ref 0.0–0.1)
Basophils Relative: 1 %
Eosinophils Absolute: 0.1 10*3/uL (ref 0.0–0.5)
Eosinophils Relative: 2 %
HCT: 40.6 % (ref 36.0–46.0)
Hemoglobin: 12.9 g/dL (ref 12.0–15.0)
Immature Granulocytes: 0 %
Lymphocytes Relative: 25 %
Lymphs Abs: 1.6 10*3/uL (ref 0.7–4.0)
MCH: 29.3 pg (ref 26.0–34.0)
MCHC: 31.8 g/dL (ref 30.0–36.0)
MCV: 92.3 fL (ref 80.0–100.0)
Monocytes Absolute: 0.6 10*3/uL (ref 0.1–1.0)
Monocytes Relative: 9 %
Neutro Abs: 4.1 10*3/uL (ref 1.7–7.7)
Neutrophils Relative %: 63 %
Platelets: 155 10*3/uL (ref 150–400)
RBC: 4.4 MIL/uL (ref 3.87–5.11)
RDW: 13.7 % (ref 11.5–15.5)
WBC: 6.4 10*3/uL (ref 4.0–10.5)
nRBC: 0 % (ref 0.0–0.2)

## 2019-03-03 LAB — LACTATE DEHYDROGENASE: LDH: 144 U/L (ref 98–192)

## 2019-03-03 MED ORDER — DIPHENHYDRAMINE HCL 25 MG PO CAPS
50.0000 mg | ORAL_CAPSULE | Freq: Once | ORAL | Status: AC
  Administered 2019-03-03: 50 mg via ORAL
  Filled 2019-03-03: qty 2

## 2019-03-03 MED ORDER — SODIUM CHLORIDE 0.9 % IV SOLN
10.0000 mg | Freq: Once | INTRAVENOUS | Status: AC
  Administered 2019-03-03: 10 mg via INTRAVENOUS
  Filled 2019-03-03: qty 10

## 2019-03-03 MED ORDER — HEPARIN SOD (PORK) LOCK FLUSH 100 UNIT/ML IV SOLN
500.0000 [IU] | Freq: Once | INTRAVENOUS | Status: AC | PRN
  Administered 2019-03-03: 500 [IU]

## 2019-03-03 MED ORDER — ACETAMINOPHEN 325 MG PO TABS
650.0000 mg | ORAL_TABLET | Freq: Once | ORAL | Status: AC
  Administered 2019-03-03: 11:00:00 650 mg via ORAL
  Filled 2019-03-03: qty 2

## 2019-03-03 MED ORDER — SODIUM CHLORIDE 0.9 % IV SOLN
Freq: Once | INTRAVENOUS | Status: AC
  Administered 2019-03-03: 11:00:00 via INTRAVENOUS

## 2019-03-03 MED ORDER — SODIUM CHLORIDE 0.9% FLUSH
10.0000 mL | INTRAVENOUS | Status: DC | PRN
  Administered 2019-03-03: 10 mL
  Filled 2019-03-03: qty 10

## 2019-03-03 MED ORDER — SODIUM CHLORIDE 0.9 % IV SOLN
375.0000 mg/m2 | Freq: Once | INTRAVENOUS | Status: AC
  Administered 2019-03-03: 600 mg via INTRAVENOUS
  Filled 2019-03-03: qty 10

## 2019-03-03 NOTE — Progress Notes (Signed)
Pt seen by Dr. Delton Coombes for appointment today and treatment. VSS. Labs reviewed by MD. Message received proceed with treatment.

## 2019-03-03 NOTE — Progress Notes (Signed)
Treatment given today per MD orders. Tolerated infusion without adverse affects. Vital signs stable. No complaints at this time. Discharged from clinic ambulatory. F/U with [facility] as scheduled.   

## 2019-03-03 NOTE — Patient Instructions (Addendum)
[facility] at [facility] Discharge Instructions  You were seen today by Dr. Delton Coombes. He went over your recent lab, scan results. He will see you back in 2 months for labs, treatment and follow up.   Thank you for choosing [place] at [facility] to provide your oncology and hematology care.  To afford each patient quality time with our provider, please arrive at least 15 minutes before your scheduled appointment time.   If you have a lab appointment with the [facility] please come in thru the  Main Entrance and check in at the main information desk  You need to re-schedule your appointment should you arrive 10 or more minutes late.  We strive to give you quality time with our providers, and arriving late affects you and other patients whose appointments are after yours.  Also, if you no show three or more times for appointments you may be dismissed from the clinic at the providers discretion.     Again, thank you for choosing [facility].  Our hope is that these requests will decrease the amount of time that you wait before being seen by our physicians.       _____________________________________________________________  Should you have questions after your visit to [facility], please contact our office at [phone] between the hours of 8:00 a.m. and 4:30 p.m.  Voicemails left after 4:00 p.m. will not be returned until the following business day.  For prescription refill requests, have your pharmacy contact our office and allow 72 hours.    Cancer Center Support Programs:   > Cancer Support Group  2nd Tuesday of the month 1pm-2pm, Journey Room

## 2019-03-03 NOTE — Assessment & Plan Note (Addendum)
1.  High-grade follicular lymphoma: - Presentation with left inguinal lymph node enlargement, no B symptoms.  Initial needle biopsy on 11/08/2017 consistent with non-Hodgkin's lymphoma. -Excisional biopsy on 11/27/2017 shows high-grade follicular lymphoma (grade 3A) - 4 cycles of R-CHOP from 12/28/2017 through 02/28/2018.  She had breathing difficulty after second and third treatments. -Consider chemotherapy was discontinued after cycle 4.  She was started on maintenance rituximab on 05/01/2018. -PET CT scan in May 2019 showed complete response in inguinal and retroperitoneal areas.  There is mild activity in the hilar regions.  High resolution CT on 04/15/2018 of the chest did not show any adenopathy. - She denies any B symptoms.  Physical exam today did not reveal any palpable adenopathy or splenomegaly. - We reviewed results of the CT CAP dated 02/25/2019 which did not show any evidence of active lymphoma.  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 -We reviewed her blood work which is within normal limits.  LDH was also within normal limits. -She may proceed with maintenance rituximab.  She will receive a total of 12 treatments.  We will see her back in 8 weeks for follow-up.  2.  Interstitial lung disease: - She was evaluated by pulmonary and was thought to have interstitial lung disease. - She denies any worsening of shortness of breath on exertion at this time.

## 2019-03-03 NOTE — Progress Notes (Signed)
[address], [place] [address]   CLINIC:  Medical Oncology/Hematology  PCP:  Celene Squibb, MD [place] [address] [phone]   REASON FOR VISIT:  Follow-up for high-grade follicular lymphoma.   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 ([place])    12/28/2017 -  Chemotherapy    The patient had DOXOrubicin (ADRIAMYCIN) chemo injection 84 mg, 50 mg/m2 = 84 mg, Intravenous,  Once, 4 of 4 cycles Administration: 84 mg (12/28/2017), 84 mg (01/18/2018), 84 mg (02/08/2018), 84 mg (02/28/2018) palonosetron (ALOXI) injection 0.25 mg, 0.25 mg, Intravenous,  Once, 4 of 4 cycles Administration: 0.25 mg (12/28/2017), 0.25 mg (01/18/2018), 0.25 mg (02/08/2018), 0.25 mg (02/28/2018) pegfilgrastim (NEULASTA) injection 6 mg, 6 mg, Subcutaneous, Once, 3 of 3 cycles Administration: 6 mg (12/31/2017), 6 mg (01/21/2018), 6 mg (02/11/2018) pegfilgrastim (NEULASTA ONPRO KIT) injection 6 mg, 6 mg, Subcutaneous, Once, [name], 2 mg, Intravenous,  Once, 4 of 4 cycles Administration: 2 mg (12/28/2017), 2 mg (01/18/2018), 2 mg (02/08/2018), 2 mg (02/28/2018) riTUXimab (RITUXAN) 600 mg in sodium chloride 0.9 % 250 mL (1.9355 mg/mL) infusion, 375 mg/m2 = 600 mg, Intravenous,  Once, [name], Cycle 6, Reason: Other (see comments)) Administration: 600 mg (12/28/2017) cyclophosphamide (CYTOXAN) 1,[name], 750 mg/m2 = 1,260 mg, Intravenous,  Once, 4 of 4 cycles Administration: 1,260 mg (12/28/2017), 1,260 mg (01/18/2018), 1,260 mg (02/08/2018), 1,260 mg (02/28/2018) riTUXimab (RITUXAN) 600 mg in sodium chloride 0.9 % 190 mL infusion, 375 mg/m2 = 600 mg,  Intravenous,  Once, 9 of 12 cycles Dose modification: 375 mg/m2 (original dose 375 mg/m2, Cycle 7) Administration: 600 mg (01/18/2018), 600 mg (02/08/2018), 600 mg (02/28/2018), 600 mg (05/01/2018), 600 mg (07/01/2018), 600 mg (08/30/2018), 600 mg (10/30/2018), 600 mg (12/30/2018) Tbo-Filgrastim (GRANIX) injection 480 mcg, 480 mcg (100 % of original dose 480 mcg), Subcutaneous,  Once, [name], Cycle 4)  for chemotherapy treatment.       CANCER STAGING: Cancer Staging No matching staging information was found for the patient.   INTERVAL HISTORY:  Ms. Paulo 78 y.o. female returns for routine follow-up and consideration for next cycle of chemotherapy. She is here today alone. She states that she Denies any nausea, vomiting, or diarrhea.  Shortness of breath on exertion is stable.  Denies any new pains. Had not noticed any recent bleeding such as epistaxis, hematuria or hematochezia. Denies recent chest pain on exertion, pre-syncopal episodes, or palpitations. Denies any numbness or tingling in hands or feet. Denies any recent fevers, infections, or recent hospitalizations. Patient reports appetite at 100% and energy level at 75%.      REVIEW OF SYSTEMS:  Review of Systems  Respiratory: Positive for shortness of breath.   All other systems reviewed and are negative.    PAST MEDICAL/SURGICAL HISTORY:  Past Medical History:  Diagnosis Date  . Arthritis    back and hips  . GERD (gastroesophageal reflux disease)    TUMS  . Hypercholesterolemia   . Hypertension   . Non Hodgkin's lymphoma [facility])    Past Surgical  History:  Procedure Laterality Date  . LYMPH NODE BIOPSY Left 11/27/2017   Procedure: LEFT INGUINAL LYMPH NODE BIOPSY;  Surgeon: Stark Klein, MD;  Location: [facility];  Service: General;  Laterality: Left;  . PORTACATH PLACEMENT Left 12/12/2017   Procedure: INSERTION PORT-A-CATH;  Surgeon: Stark Klein, MD;  Location: [facility];  Service: General;  Laterality: Left;  . TONSILLECTOMY       SOCIAL HISTORY:  Social History   Socioeconomic History  . Marital status: Widowed    Spouse name: Not on file  . Number of children: Not on file  . Years of education: Not on file  . Highest education level: Not on file  Occupational History  . Not on file  Social Needs  . Financial resource strain: Not on file  . Food insecurity:    Worry: Not on file    Inability: Not on file  . Transportation needs:    Medical: Not on file    Non-medical: Not on file  Tobacco Use  . Smoking status: Never Smoker  . Smokeless tobacco: Never Used  Substance and Sexual Activity  . Alcohol use: No  . Drug use: No  . Sexual activity: Not on file  Lifestyle  . Physical activity:    Days per week: Not on file    Minutes per session: Not on file  . Stress: Not on file  Relationships  . Social connections:    Talks on phone: Not on file    Gets together: Not on file    Attends religious service: Not on file    Active member of club or organization: Not on file    Attends meetings of clubs or organizations: Not on file    Relationship status: Not on file  . Intimate partner violence:    Fear of current or ex partner: Not on file    Emotionally abused: Not on file    Physically abused: Not on file    Forced sexual activity: Not on file  Other Topics Concern  . Not on file  Social History Narrative  . Not on file    FAMILY HISTORY:  Family History  Problem Relation Age of Onset  . Lung cancer Mother   . Kidney cancer Father   . Alcoholism Brother   . Emphysema Maternal Aunt   . Cancer Maternal Uncle   . Cancer Maternal Uncle     CURRENT MEDICATIONS:  Outpatient Encounter Medications as of 03/03/2019  Medication Sig  . acetaminophen (TYLENOL) 500 MG tablet Take 500 mg by mouth at bedtime as needed for moderate pain (takes 1/2 tablet at bedtime).   . bisoprolol-hydrochlorothiazide (ZIAC) 5-6.25 MG  tablet Take 1 tablet by mouth 2 (two) times daily.  . Multiple Vitamins-Minerals (PRESERVISION AREDS 2 PO) Take 2 capsules by mouth daily.  Marland Kitchen omega-3 acid ethyl esters (LOVAZA) 1 g capsule Take 1 g by mouth daily.   . simvastatin (ZOCOR) 10 MG tablet Take 10 mg by mouth daily.  . naproxen sodium (ALEVE) 220 MG tablet Take 220 mg by mouth as needed.  . [DISCONTINUED] diphenhydramine-acetaminophen (TYLENOL PM) 25-500 MG TABS tablet Take 1 tablet by mouth at bedtime as needed. Pt only takes a half tablet   No facility-administered encounter medications on file as of 03/03/2019.     ALLERGIES:  No Known Allergies   PHYSICAL EXAM:  ECOG Performance status: 1  Vitals:   03/03/19 0800  BP: 119/60  Pulse: (!) 58  Resp: 16  Temp: 98.1 F (36.7 C)  SpO2: 97%   Filed Weights   03/03/19 0800  Weight: 132 lb 7 oz (60.1 kg)    Physical Exam Vitals signs reviewed.  Constitutional:      Appearance: Normal appearance.  Cardiovascular:     Rate and Rhythm: Normal rate and regular rhythm.     Heart sounds: Normal heart sounds.  Pulmonary:     Effort: Pulmonary effort is normal.     Breath sounds: Normal breath sounds.  Abdominal:     General: There is no distension.     Palpations: Abdomen is soft. There is no mass.  Musculoskeletal:        General: No swelling.  Skin:    General: Skin is warm.  Neurological:     General: No focal deficit present.     Mental Status: She is alert and oriented to person, place, and time.  Psychiatric:        Mood and Affect: Mood normal.        Behavior: Behavior normal.      LABORATORY DATA:  I have reviewed the labs as listed.  CBC    Component Value Date/Time   WBC 6.4 03/03/2019 0842   RBC 4.40 03/03/2019 0842   HGB 12.9 03/03/2019 0842   HGB 11.6 02/28/2018 0824   HCT 40.6 03/03/2019 0842   PLT 155 03/03/2019 0842   PLT 220 02/28/2018 0824   MCV 92.3 03/03/2019 0842   MCH 29.3 03/03/2019 0842   MCHC 31.8 03/03/2019 0842   RDW  13.7 03/03/2019 0842   LYMPHSABS 1.6 03/03/2019 0842   MONOABS 0.6 03/03/2019 0842   EOSABS 0.1 03/03/2019 0842   BASOSABS 0.0 03/03/2019 0842   CMP Latest Ref Rng & Units 03/03/2019 02/25/2019 12/30/2018  Glucose 70 - 99 mg/dL 100(H) - 95  BUN 8 - 23 mg/dL 18 - 18  Creatinine 0.44 - 1.00 mg/dL 0.96 0.90 0.89  Sodium 135 - 145 mmol/L 142 - 140  Potassium 3.5 - 5.1 mmol/L 3.4(L) - 3.8  Chloride 98 - 111 mmol/L 104 - 104  CO2 22 - 32 mmol/L 29 - 27  Calcium 8.9 - 10.3 mg/dL 8.9 - 9.2  Total Protein 6.5 - 8.1 g/dL 6.0(L) - 6.5  Total Bilirubin 0.3 - 1.2 mg/dL 0.6 - 0.9  Alkaline Phos 38 - 126 U/L 81 - 73  AST 15 - 41 U/L 23 - 23  ALT 0 - 44 U/L 18 - 18       DIAGNOSTIC IMAGING:  I have independently reviewed the scans and discussed with the patient.   I have reviewed Venita Lick LPN's note and agree with the documentation.  I personally performed a face-to-face visit, made revisions and my assessment and plan is as follows.    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  High-grade follicular lymphoma: - Presentation with left inguinal lymph node enlargement, no B symptoms.  Initial needle biopsy on 11/08/2017 consistent with non-Hodgkin's lymphoma. -Excisional biopsy on 11/27/2017 shows high-grade follicular lymphoma (grade 3A) - 4 cycles of R-CHOP from 12/28/2017 through 02/28/2018.  She had breathing difficulty after second and third treatments. -Consider chemotherapy was discontinued after cycle 4.  She was started on maintenance rituximab on 05/01/2018. -PET CT scan in May 2019 showed complete response in inguinal and retroperitoneal areas.  There is mild activity in the hilar regions.  High resolution CT on 04/15/2018 of the chest did not  show any adenopathy. - She denies any B symptoms.  Physical exam today did not reveal any palpable adenopathy or splenomegaly. - We reviewed results of the CT CAP dated 02/25/2019 which did not  show any evidence of active lymphoma.  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 -We reviewed her blood work which is within normal limits.  LDH was also within normal limits. -She may proceed with maintenance rituximab.  She will receive a total of 12 treatments.  We will see her back in 8 weeks for follow-up.  2.  Interstitial lung disease: - She was evaluated by pulmonary and was thought to have interstitial lung disease. - She denies any worsening of shortness of breath on exertion at this time.    Total time spent is 25 minutes with more than 50% of the time spent face-to-face discussing treatment plan, side effects and coordination of care.    Orders placed this encounter:  No orders of the defined types were placed in this encounter.     Derek Jack, MD [place] [phone]

## 2019-03-17 DIAGNOSIS — R69 Illness, unspecified: Secondary | ICD-10-CM | POA: Diagnosis not present

## 2019-05-05 ENCOUNTER — Inpatient Hospital Stay (HOSPITAL_COMMUNITY): Payer: Medicare HMO

## 2019-05-05 ENCOUNTER — Inpatient Hospital Stay (HOSPITAL_COMMUNITY): Payer: Medicare HMO | Attending: Internal Medicine

## 2019-05-05 ENCOUNTER — Other Ambulatory Visit: Payer: Self-pay

## 2019-05-05 ENCOUNTER — Inpatient Hospital Stay (HOSPITAL_BASED_OUTPATIENT_CLINIC_OR_DEPARTMENT_OTHER): Payer: Medicare HMO | Admitting: Hematology

## 2019-05-05 ENCOUNTER — Encounter (HOSPITAL_COMMUNITY): Payer: Self-pay | Admitting: Hematology

## 2019-05-05 VITALS — BP 150/64 | HR 52 | Resp 16

## 2019-05-05 VITALS — BP 131/68 | HR 62 | Temp 98.8°F | Resp 16 | Wt 131.4 lb

## 2019-05-05 DIAGNOSIS — C8238 Follicular lymphoma grade IIIa, lymph nodes of multiple sites: Secondary | ICD-10-CM

## 2019-05-05 DIAGNOSIS — J849 Interstitial pulmonary disease, unspecified: Secondary | ICD-10-CM | POA: Diagnosis not present

## 2019-05-05 DIAGNOSIS — Z5112 Encounter for antineoplastic immunotherapy: Secondary | ICD-10-CM | POA: Diagnosis present

## 2019-05-05 DIAGNOSIS — Z7189 Other specified counseling: Secondary | ICD-10-CM

## 2019-05-05 LAB — CBC WITH DIFFERENTIAL/PLATELET
Abs Immature Granulocytes: 0.01 10*3/uL (ref 0.00–0.07)
Basophils Absolute: 0.1 10*3/uL (ref 0.0–0.1)
Basophils Relative: 1 %
Eosinophils Absolute: 0.1 10*3/uL (ref 0.0–0.5)
Eosinophils Relative: 1 %
HCT: 41.2 % (ref 36.0–46.0)
Hemoglobin: 13.2 g/dL (ref 12.0–15.0)
Immature Granulocytes: 0 %
Lymphocytes Relative: 28 %
Lymphs Abs: 1.7 10*3/uL (ref 0.7–4.0)
MCH: 29.2 pg (ref 26.0–34.0)
MCHC: 32 g/dL (ref 30.0–36.0)
MCV: 91.2 fL (ref 80.0–100.0)
Monocytes Absolute: 0.6 10*3/uL (ref 0.1–1.0)
Monocytes Relative: 11 %
Neutro Abs: 3.6 10*3/uL (ref 1.7–7.7)
Neutrophils Relative %: 59 %
Platelets: 137 10*3/uL — ABNORMAL LOW (ref 150–400)
RBC: 4.52 MIL/uL (ref 3.87–5.11)
RDW: 13.7 % (ref 11.5–15.5)
WBC: 6.1 10*3/uL (ref 4.0–10.5)
nRBC: 0 % (ref 0.0–0.2)

## 2019-05-05 LAB — COMPREHENSIVE METABOLIC PANEL
ALT: 18 U/L (ref 0–44)
AST: 27 U/L (ref 15–41)
Albumin: 3.9 g/dL (ref 3.5–5.0)
Alkaline Phosphatase: 75 U/L (ref 38–126)
Anion gap: 7 (ref 5–15)
BUN: 15 mg/dL (ref 8–23)
CO2: 29 mmol/L (ref 22–32)
Calcium: 9.3 mg/dL (ref 8.9–10.3)
Chloride: 105 mmol/L (ref 98–111)
Creatinine, Ser: 0.89 mg/dL (ref 0.44–1.00)
GFR calc Af Amer: >60 mL/min (ref >60)
GFR calc non Af Amer: >60 mL/min (ref >60)
Glucose, Bld: 108 mg/dL — ABNORMAL HIGH (ref 70–99)
Potassium: 3.7 mmol/L (ref 3.5–5.1)
Sodium: 141 mmol/L (ref 135–145)
Total Bilirubin: 1 mg/dL (ref 0.3–1.2)
Total Protein: 6.1 g/dL — ABNORMAL LOW (ref 6.5–8.1)

## 2019-05-05 MED ORDER — ACETAMINOPHEN 325 MG PO TABS
650.0000 mg | ORAL_TABLET | Freq: Once | ORAL | Status: AC
  Administered 2019-05-05: 650 mg via ORAL

## 2019-05-05 MED ORDER — DIPHENHYDRAMINE HCL 25 MG PO CAPS
50.0000 mg | ORAL_CAPSULE | Freq: Once | ORAL | Status: AC
  Administered 2019-05-05: 50 mg via ORAL

## 2019-05-05 MED ORDER — ACETAMINOPHEN 325 MG PO TABS
ORAL_TABLET | ORAL | Status: AC
  Filled 2019-05-05: qty 2

## 2019-05-05 MED ORDER — DIPHENHYDRAMINE HCL 25 MG PO CAPS
ORAL_CAPSULE | ORAL | Status: AC
  Filled 2019-05-05: qty 2

## 2019-05-05 MED ORDER — HEPARIN SOD (PORK) LOCK FLUSH 100 UNIT/ML IV SOLN
500.0000 [IU] | Freq: Once | INTRAVENOUS | Status: AC | PRN
  Administered 2019-05-05: 500 [IU]

## 2019-05-05 MED ORDER — SODIUM CHLORIDE 0.9 % IV SOLN
10.0000 mg | Freq: Once | INTRAVENOUS | Status: AC
  Administered 2019-05-05: 10 mg via INTRAVENOUS
  Filled 2019-05-05: qty 10

## 2019-05-05 MED ORDER — SODIUM CHLORIDE 0.9 % IV SOLN
375.0000 mg/m2 | Freq: Once | INTRAVENOUS | Status: AC
  Administered 2019-05-05: 600 mg via INTRAVENOUS
  Filled 2019-05-05: qty 10

## 2019-05-05 MED ORDER — SODIUM CHLORIDE 0.9% FLUSH
10.0000 mL | INTRAVENOUS | Status: DC | PRN
  Administered 2019-05-05: 10 mL
  Filled 2019-05-05: qty 10

## 2019-05-05 MED ORDER — SODIUM CHLORIDE 0.9 % IV SOLN
Freq: Once | INTRAVENOUS | Status: AC
  Administered 2019-05-05: 10:00:00 via INTRAVENOUS

## 2019-05-05 NOTE — Patient Instructions (Signed)
[facility] Discharge Instructions  You were seen today by Dr. Katragadda. He went over your recent lab results. He will see you back in 8 weeks for labs and follow up.   Thank you for choosing [facility] at [facility] to provide your oncology and hematology care.  To afford each patient quality time with our provider, please arrive at least 15 minutes before your scheduled appointment time.   If you have a lab appointment with the Cancer Center please come in thru the  Main Entrance and check in at the main information desk  You need to re-schedule your appointment should you arrive 10 or more minutes late.  We strive to give you quality time with our providers, and arriving late affects you and other patients whose appointments are after yours.  Also, if you no show three or more times for appointments you may be dismissed from the clinic at the providers discretion.     Again, thank you for choosing [facility].  Our hope is that these requests will decrease the amount of time that you wait before being seen by our physicians.       _____________________________________________________________  Should you have questions after your visit to [facility], please contact our office at [phone] between the hours of 8:00 a.m. and 4:30 p.m.  Voicemails left after 4:00 p.m. will not be returned until the following business day.  For prescription refill requests, have your pharmacy contact our office and allow 72 hours.    Cancer Center Support Programs:   > Cancer Support Group  2nd Tuesday of the month 1pm-2pm, Journey Room    

## 2019-05-05 NOTE — Patient Instructions (Signed)
[facility] Discharge Instructions for Patients Receiving Chemotherapy   Beginning January 23rd 2017 lab work for the Cancer Center will be done in the  Main lab at [facility] on 1st floor. If you have a lab appointment with the Cancer Center please come in thru the  Main Entrance and check in at the main information desk   Today you received the following chemotherapy agents Rituxan  To help prevent nausea and vomiting after your treatment, we encourage you to take your nausea medication   If you develop nausea and vomiting, or diarrhea that is not controlled by your medication, call the clinic.  The clinic phone number is [phone]. Office hours are Monday-Friday 8:30am-5:00pm.  BELOW ARE SYMPTOMS THAT SHOULD BE REPORTED IMMEDIATELY:  *FEVER GREATER THAN 101.0 F  *CHILLS WITH OR WITHOUT FEVER  NAUSEA AND VOMITING THAT IS NOT CONTROLLED WITH YOUR NAUSEA MEDICATION  *UNUSUAL SHORTNESS OF BREATH  *UNUSUAL BRUISING OR BLEEDING  TENDERNESS IN MOUTH AND THROAT WITH OR WITHOUT PRESENCE OF ULCERS  *URINARY PROBLEMS  *BOWEL PROBLEMS  UNUSUAL RASH Items with * indicate a potential emergency and should be followed up as soon as possible. If you have an emergency after office hours please contact your primary care physician or go to the nearest emergency department.  Please call the clinic during office hours if you have any questions or concerns.   You may also contact the Patient Navigator at [phone] should you have any questions or need assistance in obtaining follow up care.      Resources For Cancer Patients and their Caregivers ? American Cancer Society: Can assist with transportation, wigs, general needs, runs Look Good Feel Better.        1-888-227-6333 ? Cancer Care: Provides financial assistance, online support groups, medication/co-pay assistance.  1-800-813-HOPE (4673) ? [facility] Assists [place] cancer  patients and their families through emotional , educational and financial support.  336-427-4357 ? [place] DSS Where to apply for food stamps, Medicaid and utility assistance. 336-342-1394 ? [facility]: Transportation to medical appointments. 336-347-2287 ? Social Security Administration: May apply for disability if have a Stage IV cancer. 336-342-7796 1-800-772-1213 ? [place] Aging, Disability and Transit Services: Assists with nutrition, care and transit needs. 336-349-2343          

## 2019-05-05 NOTE — Progress Notes (Signed)
Alicia [name], [place] [id number]   CLINIC:  Medical Oncology/Hematology  PCP:  Alicia Squibb, MD [place] [address] [phone]   REASON FOR VISIT:  Follow-up for high-grade follicular lymphoma.   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 ([place])   12/28/2017 -  Chemotherapy   The patient had DOXOrubicin (ADRIAMYCIN) chemo injection 84 mg, 50 mg/m2 = 84 mg, Intravenous,  Once, 4 of 4 cycles Administration: 84 mg (12/28/2017), 84 mg (01/18/2018), 84 mg (02/08/2018), 84 mg (02/28/2018) palonosetron (ALOXI) injection 0.25 mg, 0.25 mg, Intravenous,  Once, 4 of 4 cycles Administration: 0.25 mg (12/28/2017), 0.25 mg (01/18/2018), 0.25 mg (02/08/2018), 0.25 mg (02/28/2018) pegfilgrastim (NEULASTA) injection 6 mg, 6 mg, Subcutaneous, Once, 3 of 3 cycles Administration: 6 mg (12/31/2017), 6 mg (01/21/2018), 6 mg (02/11/2018) pegfilgrastim (NEULASTA ONPRO KIT) injection 6 mg, 6 mg, Subcutaneous, Once, [name], 2 mg, Intravenous,  Once, 4 of 4 cycles Administration: 2 mg (12/28/2017), 2 mg (01/18/2018), 2 mg (02/08/2018), 2 mg (02/28/2018) riTUXimab (RITUXAN) 600 mg in sodium chloride 0.9 % 250 mL (1.9355 mg/mL) infusion, 375 mg/m2 = 600 mg, Intravenous,  Once, [name], Cycle 6, Reason: Other (see comments)) Administration: 600 mg (12/28/2017) cyclophosphamide (CYTOXAN) 1,[name], 750 mg/m2 = 1,260 mg, Intravenous,  Once, 4 of 4 cycles Administration: 1,260 mg (12/28/2017), 1,260 mg (01/18/2018), 1,260 mg (02/08/2018), 1,260 mg (02/28/2018) riTUXimab (RITUXAN) 600 mg in sodium chloride 0.9 % 190 mL infusion, 375 mg/m2 =  600 mg, Intravenous,  Once, 10 of 12 cycles Dose modification: 375 mg/m2 (original dose 375 mg/m2, Cycle 7) Administration: 600 mg (01/18/2018), 600 mg (02/08/2018), 600 mg (02/28/2018), 600 mg (05/01/2018), 600 mg (07/01/2018), 600 mg (08/30/2018), 600 mg (10/30/2018), 600 mg (12/30/2018), 600 mg (03/03/2019), 600 mg (05/05/2019) Tbo-Filgrastim (GRANIX) injection 480 mcg, 480 mcg (100 % of original dose 480 mcg), Subcutaneous,  Once, [name], Cycle 4)  for chemotherapy treatment.       CANCER STAGING: Cancer Staging No matching staging information was found for the patient.   INTERVAL HISTORY:  Alicia [name] 78 y.o. female seen for follow-up of lymphoma.  She is receiving rituximab every 2 months.  Appetite is 100%.  Energy levels are 100%.  No pain reported.  Denies any fevers, night sweats or weight loss in the last 6 months.  Denies any ER visits or hospitalizations.  Denies nausea, vomiting, diarrhea or constipation.  No bleeding per rectum or melena.        REVIEW OF SYSTEMS:  Review of Systems  All other systems reviewed and are negative.    PAST MEDICAL/SURGICAL HISTORY:  Past Medical History:  Diagnosis Date  . Arthritis    back and hips  . GERD (gastroesophageal reflux disease)    TUMS  . Hypercholesterolemia   . Hypertension   . Non Hodgkin's lymphoma [facility])    Past Surgical History:  Procedure Laterality Date  . LYMPH NODE BIOPSY Left 11/27/2017   Procedure: LEFT INGUINAL LYMPH NODE BIOPSY;  Surgeon: Alicia Klein, MD;  Location: [facility];  Service: General;  Laterality: Left;  . PORTACATH PLACEMENT Left 12/12/2017   Procedure: INSERTION PORT-A-CATH;  Surgeon: Alicia Klein, MD;  Location: [facility];  Service: General;  Laterality: Left;  . TONSILLECTOMY       SOCIAL HISTORY:  Social History   Socioeconomic History  . Marital status: Widowed    Spouse name: Not on file  . Number of children: Not on  file  . Years of education: Not on file  . Highest education level: Not on file  Occupational History  . Not on file  Social Needs  . Financial resource strain: Not on file  . Food insecurity    Worry: Not on file    Inability: Not on file  . Transportation needs    Medical: Not on file    Non-medical: Not on file  Tobacco Use  . Smoking status: Never Smoker  . Smokeless tobacco: Never Used  Substance and Sexual Activity  . Alcohol use: No  . Drug use: No  . Sexual activity: Not on file  Lifestyle  . Physical activity    Days per week: Not on file    Minutes per session: Not on file  . Stress: Not on file  Relationships  . Social Herbalist on phone: Not on file    Gets together: Not on file    Attends religious service: Not on file    Active member of club or organization: Not on file    Attends meetings of clubs or organizations: Not on file    Relationship status: Not on file  . Intimate partner violence    Fear of current or ex partner: Not on file    Emotionally abused: Not on file    Physically abused: Not on file    Forced sexual activity: Not on file  Other Topics Concern  . Not on file  Social History Narrative  . Not on file    FAMILY HISTORY:  Family History  Problem Relation Age of Onset  . Lung cancer Mother   . Kidney cancer Father   . Alcoholism Brother   . Emphysema Maternal Aunt   . Cancer Maternal Uncle   . Cancer Maternal Uncle     CURRENT MEDICATIONS:  Outpatient Encounter Medications as of 05/05/2019  Medication Sig  . acetaminophen (TYLENOL) 500 MG tablet Take 500 mg by mouth at bedtime as needed for moderate pain (takes 1/2 tablet at bedtime).   . bisoprolol-hydrochlorothiazide (ZIAC) 5-6.25 MG tablet Take 1 tablet by mouth 2 (two) times daily.  . naproxen sodium (ALEVE) 220 MG tablet Take 220 mg by mouth as needed.  . simvastatin (ZOCOR) 10 MG tablet Take 10 mg by mouth daily.  . [DISCONTINUED] Multiple Vitamins-Minerals  (PRESERVISION AREDS 2 PO) Take 2 capsules by mouth daily.  . [DISCONTINUED] omega-3 acid ethyl esters (LOVAZA) 1 g capsule Take 1 g by mouth daily.    No facility-administered encounter medications on file as of 05/05/2019.     ALLERGIES:  No Known Allergies   PHYSICAL EXAM:  ECOG Performance status: 1  Vitals:   05/05/19 0921  BP: 131/68  Pulse: 62  Resp: 16  Temp: 98.8 F (37.1 C)  SpO2: 97%   Filed Weights   05/05/19 0921  Weight: 131 lb 6.4 oz (59.6 kg)    Physical Exam Vitals signs reviewed.  Constitutional:      Appearance: Normal appearance.  Cardiovascular:     Rate and Rhythm: Normal rate  and regular rhythm.     Heart sounds: Normal heart sounds.  Pulmonary:     Effort: Pulmonary effort is normal.     Breath sounds: Normal breath sounds.  Abdominal:     General: There is no distension.     Palpations: Abdomen is soft. There is no mass.  Musculoskeletal:        General: No swelling.  Skin:    General: Skin is warm.  Neurological:     General: No focal deficit present.     Mental Status: She is alert and oriented to person, place, and time.  Psychiatric:        Mood and Affect: Mood normal.        Behavior: Behavior normal.      LABORATORY DATA:  I have reviewed the labs as listed.  CBC    Component Value Date/Time   WBC 6.1 05/05/2019 0909   RBC 4.52 05/05/2019 0909   HGB 13.2 05/05/2019 0909   HGB 11.6 02/28/2018 0824   HCT 41.2 05/05/2019 0909   PLT 137 (L) 05/05/2019 0909   PLT 220 02/28/2018 0824   MCV 91.2 05/05/2019 0909   MCH 29.2 05/05/2019 0909   MCHC 32.0 05/05/2019 0909   RDW 13.7 05/05/2019 0909   LYMPHSABS 1.7 05/05/2019 0909   MONOABS 0.6 05/05/2019 0909   EOSABS 0.1 05/05/2019 0909   BASOSABS 0.1 05/05/2019 0909   CMP Latest Ref Rng & Units 05/05/2019 03/03/2019 02/25/2019  Glucose 70 - 99 mg/dL 108(H) 100(H) -  BUN 8 - 23 mg/dL 15 18 -  Creatinine 0.44 - 1.00 mg/dL 0.89 0.96 0.90  Sodium 135 - 145 mmol/L 141 142 -   Potassium 3.5 - 5.1 mmol/L 3.7 3.4(L) -  Chloride 98 - 111 mmol/L 105 104 -  CO2 22 - 32 mmol/L 29 29 -  Calcium 8.9 - 10.3 mg/dL 9.3 8.9 -  Total Protein 6.5 - 8.1 g/dL 6.1(L) 6.0(L) -  Total Bilirubin 0.3 - 1.2 mg/dL 1.0 0.6 -  Alkaline Phos 38 - 126 U/L 75 81 -  AST 15 - 41 U/L 27 23 -  ALT 0 - 44 U/L 18 18 -       DIAGNOSTIC IMAGING:  I have independently reviewed the scans and discussed with the patient.   I have reviewed Venita Lick LPN's note and agree with the documentation.  I personally performed a face-to-face visit, made revisions and my assessment and plan is as follows.    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  High-grade follicular lymphoma: - Presentation with left inguinal lymph node enlargement, no B symptoms.  Initial needle biopsy on 11/08/2017 consistent with non-Hodgkin's lymphoma. -Excisional biopsy on 11/27/2017 shows high-grade follicular lymphoma (grade 3A) - 4 cycles of R-CHOP from 12/28/2017 through 02/28/2018.  She had breathing difficulty after second and third treatments. -Consider chemotherapy was discontinued after cycle 4.  She was started on maintenance rituximab on 05/01/2018. -PET CT scan in May 2019 showed complete response in inguinal and retroperitoneal areas.  There is mild activity in the hilar regions.  High resolution CT on 04/15/2018 of the chest did not show any adenopathy. - CT CAP on 02/25/2019 did not show any evidence of active lymphoma.  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 -Physical exam today did not reveal any palpable adenopathy or splenomegaly.  She denies any B symptoms. -We reviewed her blood work which is  normal.  LDH was normal. -She may proceed with maintenance rituximab today.  She will receive a total of 12 treatments. -I will see her back in 8 weeks for follow-up.  I plan to repeat a CT CAP prior to next visit.  2.   Interstitial lung disease: -She was evaluated by pulmonary and was thought to have interstitial lung disease. -She denies any worsening of shortness of breath on exertion at this time.    Total time spent is 25 minutes with more than 50% of the time spent face-to-face discussing treatment plan, side effects and coordination of care.    Orders placed this encounter:  Orders Placed This Encounter  Procedures  . CT Abdomen Pelvis W Contrast  . CT Chest W Contrast      Derek Jack, MD [place] [phone]

## 2019-05-05 NOTE — Progress Notes (Signed)
Patient seen by Dr. Delton Coombes who approved pt for treatment today. Pt tolerated Rituxan without incident or complaint. VSS. Discharged self ambulatory in satisfactory condition.

## 2019-05-05 NOTE — Assessment & Plan Note (Signed)
1.  High-grade follicular lymphoma: - Presentation with left inguinal lymph node enlargement, no B symptoms.  Initial needle biopsy on 11/08/2017 consistent with non-Hodgkin's lymphoma. -Excisional biopsy on 11/27/2017 shows high-grade follicular lymphoma (grade 3A) - 4 cycles of R-CHOP from 12/28/2017 through 02/28/2018.  She had breathing difficulty after second and third treatments. -Consider chemotherapy was discontinued after cycle 4.  She was started on maintenance rituximab on 05/01/2018. -PET CT scan in May 2019 showed complete response in inguinal and retroperitoneal areas.  There is mild activity in the hilar regions.  High resolution CT on 04/15/2018 of the chest did not show any adenopathy. - CT CAP on 02/25/2019 did not show any evidence of active lymphoma.  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 -Physical exam today did not reveal any palpable adenopathy or splenomegaly.  She denies any B symptoms. -We reviewed her blood work which is normal.  LDH was normal. -She may proceed with maintenance rituximab today.  She will receive a total of 12 treatments. -I will see her back in 8 weeks for follow-up.  I plan to repeat a CT CAP prior to next visit.  2.  Interstitial lung disease: -She was evaluated by pulmonary and was thought to have interstitial lung disease. -She denies any worsening of shortness of breath on exertion at this time.

## 2019-05-07 DIAGNOSIS — W57XXXA Bitten or stung by nonvenomous insect and other nonvenomous arthropods, initial encounter: Secondary | ICD-10-CM | POA: Diagnosis not present

## 2019-05-07 DIAGNOSIS — M25561 Pain in right knee: Secondary | ICD-10-CM | POA: Diagnosis not present

## 2019-05-20 ENCOUNTER — Other Ambulatory Visit (HOSPITAL_COMMUNITY): Payer: Self-pay | Admitting: Family Medicine

## 2019-05-20 ENCOUNTER — Ambulatory Visit (HOSPITAL_COMMUNITY)
Admission: RE | Admit: 2019-05-20 | Discharge: 2019-05-20 | Disposition: A | Discharge status: Home / Self Care | Payer: Medicare HMO | Source: Ambulatory Visit | Attending: Family Medicine | Admitting: Family Medicine

## 2019-05-20 ENCOUNTER — Other Ambulatory Visit: Payer: Self-pay

## 2019-05-20 DIAGNOSIS — M25561 Pain in right knee: Secondary | ICD-10-CM

## 2019-06-25 ENCOUNTER — Other Ambulatory Visit (HOSPITAL_COMMUNITY): Payer: Self-pay

## 2019-06-25 DIAGNOSIS — C8238 Follicular lymphoma grade IIIa, lymph nodes of multiple sites: Secondary | ICD-10-CM

## 2019-06-26 ENCOUNTER — Other Ambulatory Visit: Payer: Self-pay

## 2019-06-26 ENCOUNTER — Ambulatory Visit (HOSPITAL_COMMUNITY)
Admission: RE | Admit: 2019-06-26 | Discharge: 2019-06-26 | Disposition: A | Discharge status: Home / Self Care | Payer: Medicare HMO | Source: Ambulatory Visit | Attending: Hematology | Admitting: Hematology

## 2019-06-26 DIAGNOSIS — C8238 Follicular lymphoma grade IIIa, lymph nodes of multiple sites: Secondary | ICD-10-CM

## 2019-06-26 DIAGNOSIS — C8230 Follicular lymphoma grade IIIa, unspecified site: Secondary | ICD-10-CM | POA: Diagnosis not present

## 2019-06-26 LAB — POCT I-STAT CREATININE: Creatinine, Ser: 1 mg/dL (ref 0.44–1.00)

## 2019-06-26 MED ORDER — IOHEXOL 300 MG/ML  SOLN
100.0000 mL | Freq: Once | INTRAMUSCULAR | Status: AC | PRN
  Administered 2019-06-26: 11:00:00 100 mL via INTRAVENOUS

## 2019-06-30 ENCOUNTER — Other Ambulatory Visit: Payer: Self-pay

## 2019-06-30 ENCOUNTER — Inpatient Hospital Stay (HOSPITAL_COMMUNITY): Payer: Medicare HMO

## 2019-06-30 ENCOUNTER — Inpatient Hospital Stay (HOSPITAL_COMMUNITY): Payer: Medicare HMO | Attending: Internal Medicine

## 2019-06-30 ENCOUNTER — Encounter (HOSPITAL_COMMUNITY): Payer: Self-pay | Admitting: Hematology

## 2019-06-30 ENCOUNTER — Inpatient Hospital Stay (HOSPITAL_BASED_OUTPATIENT_CLINIC_OR_DEPARTMENT_OTHER): Payer: Medicare HMO | Admitting: Hematology

## 2019-06-30 VITALS — BP 127/48 | HR 57 | Resp 16

## 2019-06-30 DIAGNOSIS — C8238 Follicular lymphoma grade IIIa, lymph nodes of multiple sites: Secondary | ICD-10-CM | POA: Diagnosis not present

## 2019-06-30 DIAGNOSIS — Z7189 Other specified counseling: Secondary | ICD-10-CM

## 2019-06-30 DIAGNOSIS — Z5112 Encounter for antineoplastic immunotherapy: Secondary | ICD-10-CM | POA: Insufficient documentation

## 2019-06-30 LAB — COMPREHENSIVE METABOLIC PANEL
ALT: 18 U/L (ref 0–44)
AST: 21 U/L (ref 15–41)
Albumin: 3.8 g/dL (ref 3.5–5.0)
Alkaline Phosphatase: 73 U/L (ref 38–126)
Anion gap: 7 (ref 5–15)
BUN: 19 mg/dL (ref 8–23)
CO2: 29 mmol/L (ref 22–32)
Calcium: 9.4 mg/dL (ref 8.9–10.3)
Chloride: 104 mmol/L (ref 98–111)
Creatinine, Ser: 0.82 mg/dL (ref 0.44–1.00)
GFR calc Af Amer: >60 mL/min (ref >60)
GFR calc non Af Amer: >60 mL/min (ref >60)
Glucose, Bld: 91 mg/dL (ref 70–99)
Potassium: 3.8 mmol/L (ref 3.5–5.1)
Sodium: 140 mmol/L (ref 135–145)
Total Bilirubin: 0.7 mg/dL (ref 0.3–1.2)
Total Protein: 6.3 g/dL — ABNORMAL LOW (ref 6.5–8.1)

## 2019-06-30 LAB — CBC WITH DIFFERENTIAL/PLATELET
Abs Immature Granulocytes: 0.03 10*3/uL (ref 0.00–0.07)
Basophils Absolute: 0.1 10*3/uL (ref 0.0–0.1)
Basophils Relative: 1 %
Eosinophils Absolute: 0.1 10*3/uL (ref 0.0–0.5)
Eosinophils Relative: 2 %
HCT: 41.1 % (ref 36.0–46.0)
Hemoglobin: 13.3 g/dL (ref 12.0–15.0)
Immature Granulocytes: 1 %
Lymphocytes Relative: 32 %
Lymphs Abs: 2 10*3/uL (ref 0.7–4.0)
MCH: 29.4 pg (ref 26.0–34.0)
MCHC: 32.4 g/dL (ref 30.0–36.0)
MCV: 90.9 fL (ref 80.0–100.0)
Monocytes Absolute: 0.6 10*3/uL (ref 0.1–1.0)
Monocytes Relative: 10 %
Neutro Abs: 3.4 10*3/uL (ref 1.7–7.7)
Neutrophils Relative %: 54 %
Platelets: 152 10*3/uL (ref 150–400)
RBC: 4.52 MIL/uL (ref 3.87–5.11)
RDW: 13.9 % (ref 11.5–15.5)
WBC: 6.2 10*3/uL (ref 4.0–10.5)
nRBC: 0 % (ref 0.0–0.2)

## 2019-06-30 MED ORDER — SODIUM CHLORIDE 0.9% FLUSH
10.0000 mL | INTRAVENOUS | Status: DC | PRN
  Administered 2019-06-30: 09:00:00 10 mL
  Filled 2019-06-30: qty 10

## 2019-06-30 MED ORDER — SODIUM CHLORIDE 0.9 % IV SOLN
375.0000 mg/m2 | Freq: Once | INTRAVENOUS | Status: AC
  Administered 2019-06-30: 600 mg via INTRAVENOUS
  Filled 2019-06-30: qty 50

## 2019-06-30 MED ORDER — SODIUM CHLORIDE 0.9 % IV SOLN
Freq: Once | INTRAVENOUS | Status: AC
  Administered 2019-06-30: 10:00:00 via INTRAVENOUS

## 2019-06-30 MED ORDER — HEPARIN SOD (PORK) LOCK FLUSH 100 UNIT/ML IV SOLN
500.0000 [IU] | Freq: Once | INTRAVENOUS | Status: AC | PRN
  Administered 2019-06-30: 14:00:00 500 [IU]

## 2019-06-30 MED ORDER — ACETAMINOPHEN 325 MG PO TABS
650.0000 mg | ORAL_TABLET | Freq: Once | ORAL | Status: AC
  Administered 2019-06-30: 650 mg via ORAL
  Filled 2019-06-30: qty 2

## 2019-06-30 MED ORDER — LANREOTIDE ACETATE 120 MG/0.5ML ~~LOC~~ SOLN
SUBCUTANEOUS | Status: AC
  Filled 2019-06-30: qty 120

## 2019-06-30 MED ORDER — SODIUM CHLORIDE 0.9 % IV SOLN
10.0000 mg | Freq: Once | INTRAVENOUS | Status: AC
  Administered 2019-06-30: 10 mg via INTRAVENOUS
  Filled 2019-06-30: qty 10

## 2019-06-30 MED ORDER — DIPHENHYDRAMINE HCL 25 MG PO CAPS
50.0000 mg | ORAL_CAPSULE | Freq: Once | ORAL | Status: AC
  Administered 2019-06-30: 11:00:00 25 mg via ORAL
  Filled 2019-06-30: qty 2

## 2019-06-30 NOTE — Patient Instructions (Signed)
[facility] Discharge Instructions for Patients Receiving Chemotherapy  Today you received the following chemotherapy agents   To help prevent nausea and vomiting after your treatment, we encourage you to take your nausea medication   If you develop nausea and vomiting that is not controlled by your nausea medication, call the clinic.   BELOW ARE SYMPTOMS THAT SHOULD BE REPORTED IMMEDIATELY:  *FEVER GREATER THAN 100.5 F  *CHILLS WITH OR WITHOUT FEVER  NAUSEA AND VOMITING THAT IS NOT CONTROLLED WITH YOUR NAUSEA MEDICATION  *UNUSUAL SHORTNESS OF BREATH  *UNUSUAL BRUISING OR BLEEDING  TENDERNESS IN MOUTH AND THROAT WITH OR WITHOUT PRESENCE OF ULCERS  *URINARY PROBLEMS  *BOWEL PROBLEMS  UNUSUAL RASH Items with * indicate a potential emergency and should be followed up as soon as possible.  Feel free to call the clinic should you have any questions or concerns. The clinic phone number is [phone].  Please show the CHEMO ALERT CARD at check-in to the Emergency Department and triage nurse.   

## 2019-06-30 NOTE — Patient Instructions (Addendum)
[place] at [facility] Discharge Instructions  You were seen today by Dr. Delton Coombes. He went over your recent lab and scan results. He will see you back in 2 months for labs, treatment and follow up.   Thank you for choosing [place] at [facility] to provide your oncology and hematology care.  To afford each patient quality time with our provider, please arrive at least 15 minutes before your scheduled appointment time.   If you have a lab appointment with the [facility] please come in thru the  Main Entrance and check in at the main information desk  You need to re-schedule your appointment should you arrive 10 or more minutes late.  We strive to give you quality time with our providers, and arriving late affects you and other patients whose appointments are after yours.  Also, if you no show three or more times for appointments you may be dismissed from the clinic at the providers discretion.     Again, thank you for choosing [facility].  Our hope is that these requests will decrease the amount of time that you wait before being seen by our physicians.       _____________________________________________________________  Should you have questions after your visit to [facility], please contact our office at [phone] between the hours of 8:00 a.m. and 4:30 p.m.  Voicemails left after 4:00 p.m. will not be returned until the following business day.  For prescription refill requests, have your pharmacy contact our office and allow 72 hours.    Cancer Center Support Programs:   > Cancer Support Group  2nd Tuesday of the month 1pm-2pm, Journey Room

## 2019-06-30 NOTE — Progress Notes (Signed)
Labs reviewed with MD today. Proceed with treatment today per MD.   Treatment given per orders. Patient tolerated it well without problems. Vitals stable and discharged home from clinic ambulatory. Follow up as scheduled.  

## 2019-06-30 NOTE — Assessment & Plan Note (Signed)
1.  High-grade follicular lymphoma: -Presentation with left inguinal lymph node enlargement, no B symptoms.  Initial needle biopsy on 11/08/2016 consistent with non-Hodgkin's lymphoma. -Excisional biopsy on 11/27/2017 shows high-grade follicular lymphoma (grade 3A). -4 cycles of R-CHOP from 12/28/2017 through 02/28/2018.  She had difficulty breathing after second and third treatments. - Chemotherapy was discontinued after cycle 4.  Maintenance rituximab started on 05/01/2018. - She is tolerating rituximab very well. -CT CAP on 06/26/2019 did not show any evidence of lymphadenopathy.  Spleen was normal. -Physical exam today did not reveal any palpable adenopathy or splenomegaly.  She denied any B symptoms. -We reviewed her labs.  LDH was normal.  She will proceed with maintenance rituximab. -I will see her back in 8 weeks for follow-up.  I plan to repeat her CT scan in 6 months.  2.  Interstitial lung disease: -She was evaluated by pulmonary and was thought to have interstitial lung disease. - She has some shortness of breath which is stable.  She is not on any treatments.

## 2019-06-30 NOTE — Progress Notes (Signed)
[place],  [address]   CLINIC:  Medical Oncology/Hematology  PCP:  Celene Squibb, MD [place] [address] [phone]   REASON FOR VISIT:  Follow-up for high-grade follicular lymphoma.   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 ([place])   12/28/2017 -  Chemotherapy   The patient had DOXOrubicin (ADRIAMYCIN) chemo injection 84 mg, 50 mg/m2 = 84 mg, Intravenous,  Once, 4 of 4 cycles Administration: 84 mg (12/28/2017), 84 mg (01/18/2018), 84 mg (02/08/2018), 84 mg (02/28/2018) palonosetron (ALOXI) injection 0.25 mg, 0.25 mg, Intravenous,  Once, 4 of 4 cycles Administration: 0.25 mg (12/28/2017), 0.25 mg (01/18/2018), 0.25 mg (02/08/2018), 0.25 mg (02/28/2018) pegfilgrastim (NEULASTA) injection 6 mg, 6 mg, Subcutaneous, Once, 3 of 3 cycles Administration: 6 mg (12/31/2017), 6 mg (01/21/2018), 6 mg (02/11/2018) pegfilgrastim (NEULASTA ONPRO KIT) injection 6 mg, 6 mg, Subcutaneous, Once, [name], 2 mg, Intravenous,  Once, 4 of 4 cycles Administration: 2 mg (12/28/2017), 2 mg (01/18/2018), 2 mg (02/08/2018), 2 mg (02/28/2018) riTUXimab (RITUXAN) 600 mg in sodium chloride 0.9 % 250 mL (1.9355 mg/mL) infusion, 375 mg/m2 = 600 mg, Intravenous,  Once, [name], Cycle 6, Reason: Other (see comments)) Administration: 600 mg (12/28/2017) cyclophosphamide (CYTOXAN) 1,[name], 750 mg/m2 = 1,260 mg, Intravenous,  Once, 4 of 4 cycles Administration: 1,260 mg (12/28/2017), 1,260 mg (01/18/2018), 1,260 mg (02/08/2018), 1,260 mg (02/28/2018) riTUXimab (RITUXAN) 600 mg in sodium chloride 0.9 % 190 mL infusion, 375 mg/m2 =  600 mg, Intravenous,  Once, 11 of 15 cycles Dose modification: 375 mg/m2 (original dose 375 mg/m2, Cycle 7) Administration: 600 mg (01/18/2018), 600 mg (02/08/2018), 600 mg (02/28/2018), 600 mg (05/01/2018), 600 mg (07/01/2018), 600 mg (08/30/2018), 600 mg (10/30/2018), 600 mg (12/30/2018), 600 mg (03/03/2019), 600 mg (05/05/2019), 600 mg (06/30/2019) Tbo-Filgrastim (GRANIX) injection 480 mcg, 480 mcg (100 % of original dose 480 mcg), Subcutaneous,  Once, [name], Cycle 4)  for chemotherapy treatment.       CANCER STAGING: Cancer Staging No matching staging information was found for the patient.   INTERVAL HISTORY:  Ms. Breisch 78 y.o. female seen for follow-up of lymphoma.  She is tolerating rituximab every 2 months very well.  Appetite is 100%.  Energy levels are 75%.  Shortness of breath with activity is also stable from her interstitial lung disease.  She has some sleep problems and takes Tylenol as needed.  No pains reported.  No nausea, vomiting or diarrhea or constipation.  No fevers or chills.    REVIEW OF SYSTEMS:  Review of Systems  Respiratory: Positive for shortness of breath.   Psychiatric/Behavioral: Positive for sleep disturbance.  All other systems reviewed and are negative.    PAST MEDICAL/SURGICAL HISTORY:  Past Medical History:  Diagnosis Date  . Arthritis    back and hips  . GERD (gastroesophageal reflux disease)    TUMS  . Hypercholesterolemia   . Hypertension   . Non Hodgkin's lymphoma [facility])    Past Surgical History:  Procedure Laterality Date  . LYMPH NODE BIOPSY Left 11/27/2017  Procedure: LEFT INGUINAL LYMPH NODE BIOPSY;  Surgeon: Stark Klein, MD;  Location: [place];  Service: General;  Laterality: Left;  . PORTACATH PLACEMENT Left 12/12/2017   Procedure: INSERTION PORT-A-CATH;  Surgeon: Stark Klein, MD;  Location: [place];  Service: General;  Laterality: Left;  . TONSILLECTOMY        SOCIAL HISTORY:  Social History   Socioeconomic History  . Marital status: Widowed    Spouse name: Not on file  . Number of children: Not on file  . Years of education: Not on file  . Highest education level: Not on file  Occupational History  . Not on file  Social Needs  . Financial resource strain: Not on file  . Food insecurity    Worry: Not on file    Inability: Not on file  . Transportation needs    Medical: Not on file    Non-medical: Not on file  Tobacco Use  . Smoking status: Never Smoker  . Smokeless tobacco: Never Used  Substance and Sexual Activity  . Alcohol use: No  . Drug use: No  . Sexual activity: Not on file  Lifestyle  . Physical activity    Days per week: Not on file    Minutes per session: Not on file  . Stress: Not on file  Relationships  . Social Herbalist on phone: Not on file    Gets together: Not on file    Attends religious service: Not on file    Active member of club or organization: Not on file    Attends meetings of clubs or organizations: Not on file    Relationship status: Not on file  . Intimate partner violence    Fear of current or ex partner: Not on file    Emotionally abused: Not on file    Physically abused: Not on file    Forced sexual activity: Not on file  Other Topics Concern  . Not on file  Social History Narrative  . Not on file    FAMILY HISTORY:  Family History  Problem Relation Age of Onset  . Lung cancer Mother   . Kidney cancer Father   . Alcoholism Brother   . Emphysema Maternal Aunt   . Cancer Maternal Uncle   . Cancer Maternal Uncle     CURRENT MEDICATIONS:  Outpatient Encounter Medications as of 06/30/2019  Medication Sig  . diphenhydramine-acetaminophen (TYLENOL PM) 25-500 MG TABS tablet Take 0.5 tablets by mouth at bedtime as needed.  . Multiple Vitamins-Minerals (ICAPS) TABS Take 1 tablet by mouth 2 (two) times daily.  . riTUXimab (RITUXAN IV) Inject into the vein. Every 2 months  .  acetaminophen (TYLENOL) 500 MG tablet Take 500 mg by mouth at bedtime as needed for moderate pain (takes 1/2 tablet at bedtime).   . bisoprolol-hydrochlorothiazide (ZIAC) 5-6.25 MG tablet Take 1 tablet by mouth 2 (two) times daily.  . naproxen sodium (ALEVE) 220 MG tablet Take 220 mg by mouth as needed.  . simvastatin (ZOCOR) 10 MG tablet Take 10 mg by mouth daily.  . [DISCONTINUED] Omega-3 1000 MG CAPS Take 1 capsule by mouth daily.   No facility-administered encounter medications on file as of 06/30/2019.     ALLERGIES:  No Known Allergies   PHYSICAL EXAM:  ECOG Performance status: 1  Vitals:   06/30/19 0849  BP: 137/84  Pulse: 66  Resp: 16  Temp: (!) 96.9 F (36.1 C)  SpO2: 99%   Filed  Weights   06/30/19 0849  Weight: 130 lb 12.8 oz (59.3 kg)    Physical Exam Vitals signs reviewed.  Constitutional:      Appearance: Normal appearance.  Cardiovascular:     Rate and Rhythm: Normal rate and regular rhythm.     Heart sounds: Normal heart sounds.  Pulmonary:     Effort: Pulmonary effort is normal.     Breath sounds: Normal breath sounds.  Abdominal:     General: There is no distension.     Palpations: Abdomen is soft. There is no mass.  Musculoskeletal:        General: No swelling.  Skin:    General: Skin is warm.  Neurological:     General: No focal deficit present.     Mental Status: She is alert and oriented to person, place, and time.  Psychiatric:        Mood and Affect: Mood normal.        Behavior: Behavior normal.      LABORATORY DATA:  I have reviewed the labs as listed.  CBC    Component Value Date/Time   WBC 6.2 06/30/2019 0902   RBC 4.52 06/30/2019 0902   HGB 13.3 06/30/2019 0902   HGB 11.6 02/28/2018 0824   HCT 41.1 06/30/2019 0902   PLT 152 06/30/2019 0902   PLT 220 02/28/2018 0824   MCV 90.9 06/30/2019 0902   MCH 29.4 06/30/2019 0902   MCHC 32.4 06/30/2019 0902   RDW 13.9 06/30/2019 0902   LYMPHSABS 2.0 06/30/2019 0902   MONOABS 0.6  06/30/2019 0902   EOSABS 0.1 06/30/2019 0902   BASOSABS 0.1 06/30/2019 0902   CMP Latest Ref Rng & Units 06/30/2019 06/26/2019 05/05/2019  Glucose 70 - 99 mg/dL 91 - 108(H)  BUN 8 - 23 mg/dL 19 - 15  Creatinine 0.44 - 1.00 mg/dL 0.82 1.00 0.89  Sodium 135 - 145 mmol/L 140 - 141  Potassium 3.5 - 5.1 mmol/L 3.8 - 3.7  Chloride 98 - 111 mmol/L 104 - 105  CO2 22 - 32 mmol/L 29 - 29  Calcium 8.9 - 10.3 mg/dL 9.4 - 9.3  Total Protein 6.5 - 8.1 g/dL 6.3(L) - 6.1(L)  Total Bilirubin 0.3 - 1.2 mg/dL 0.7 - 1.0  Alkaline Phos 38 - 126 U/L 73 - 75  AST 15 - 41 U/L 21 - 27  ALT 0 - 44 U/L 18 - 18       DIAGNOSTIC IMAGING:  I have independently reviewed the scans and discussed with the patient.   I have reviewed Venita Lick LPN's note and agree with the documentation.  I personally performed a face-to-face visit, made revisions and my assessment and plan is as follows.    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  High-grade follicular lymphoma: -Presentation with left inguinal lymph node enlargement, no B symptoms.  Initial needle biopsy on 11/08/2016 consistent with non-Hodgkin's lymphoma. -Excisional biopsy on 11/27/2017 shows high-grade follicular lymphoma (grade 3A). -4 cycles of R-CHOP from 12/28/2017 through 02/28/2018.  She had difficulty breathing after second and third treatments. - Chemotherapy was discontinued after cycle 4.  Maintenance rituximab started on 05/01/2018. - She is tolerating rituximab very well. -CT CAP on 06/26/2019 did not show any evidence of lymphadenopathy.  Spleen was normal. -Physical exam today did not reveal any palpable adenopathy or splenomegaly.  She denied any B symptoms. -We reviewed her labs.  LDH was normal.  She will  proceed with maintenance rituximab. -I will see her back in 8 weeks for follow-up.  I plan to repeat her CT scan in 6 months.  2.  Interstitial lung disease: -She was evaluated by  pulmonary and was thought to have interstitial lung disease. - She has some shortness of breath which is stable.  She is not on any treatments.  Total time spent is 25 minutes with more than 50% of the time spent face-to-face discussing treatment plan, side effects and coordination of care.    Orders placed this encounter:  No orders of the defined types were placed in this encounter.     Derek Jack, MD [place] [phone]

## 2019-07-03 DIAGNOSIS — K219 Gastro-esophageal reflux disease without esophagitis: Secondary | ICD-10-CM | POA: Diagnosis not present

## 2019-07-03 DIAGNOSIS — E782 Mixed hyperlipidemia: Secondary | ICD-10-CM | POA: Diagnosis not present

## 2019-07-03 DIAGNOSIS — R739 Hyperglycemia, unspecified: Secondary | ICD-10-CM | POA: Diagnosis not present

## 2019-07-03 DIAGNOSIS — I10 Essential (primary) hypertension: Secondary | ICD-10-CM | POA: Diagnosis not present

## 2019-07-03 DIAGNOSIS — R7301 Impaired fasting glucose: Secondary | ICD-10-CM | POA: Diagnosis not present

## 2019-07-24 DIAGNOSIS — R7301 Impaired fasting glucose: Secondary | ICD-10-CM | POA: Diagnosis not present

## 2019-07-24 DIAGNOSIS — C8590 Non-Hodgkin lymphoma, unspecified, unspecified site: Secondary | ICD-10-CM | POA: Diagnosis not present

## 2019-07-24 DIAGNOSIS — I10 Essential (primary) hypertension: Secondary | ICD-10-CM | POA: Diagnosis not present

## 2019-07-24 DIAGNOSIS — E782 Mixed hyperlipidemia: Secondary | ICD-10-CM | POA: Diagnosis not present

## 2019-07-28 DIAGNOSIS — E782 Mixed hyperlipidemia: Secondary | ICD-10-CM | POA: Diagnosis not present

## 2019-07-28 DIAGNOSIS — H3530 Unspecified macular degeneration: Secondary | ICD-10-CM | POA: Diagnosis not present

## 2019-07-28 DIAGNOSIS — R7301 Impaired fasting glucose: Secondary | ICD-10-CM | POA: Diagnosis not present

## 2019-07-28 DIAGNOSIS — Z6820 Body mass index (BMI) 20.0-20.9, adult: Secondary | ICD-10-CM | POA: Diagnosis not present

## 2019-07-28 DIAGNOSIS — J849 Interstitial pulmonary disease, unspecified: Secondary | ICD-10-CM | POA: Diagnosis not present

## 2019-07-28 DIAGNOSIS — N200 Calculus of kidney: Secondary | ICD-10-CM | POA: Diagnosis not present

## 2019-07-28 DIAGNOSIS — I10 Essential (primary) hypertension: Secondary | ICD-10-CM | POA: Diagnosis not present

## 2019-07-28 DIAGNOSIS — R69 Illness, unspecified: Secondary | ICD-10-CM | POA: Diagnosis not present

## 2019-07-28 DIAGNOSIS — C8238 Follicular lymphoma grade IIIa, lymph nodes of multiple sites: Secondary | ICD-10-CM | POA: Diagnosis not present

## 2019-09-01 ENCOUNTER — Inpatient Hospital Stay (HOSPITAL_COMMUNITY): Payer: Medicare HMO

## 2019-09-01 ENCOUNTER — Other Ambulatory Visit: Payer: Self-pay

## 2019-09-01 ENCOUNTER — Inpatient Hospital Stay (HOSPITAL_COMMUNITY): Payer: Medicare HMO | Attending: Internal Medicine | Admitting: Hematology

## 2019-09-01 ENCOUNTER — Encounter (HOSPITAL_COMMUNITY): Payer: Self-pay | Admitting: Hematology

## 2019-09-01 VITALS — BP 130/63 | HR 56 | Temp 97.8°F | Resp 18

## 2019-09-01 DIAGNOSIS — C8238 Follicular lymphoma grade IIIa, lymph nodes of multiple sites: Secondary | ICD-10-CM | POA: Diagnosis not present

## 2019-09-01 DIAGNOSIS — Z7189 Other specified counseling: Secondary | ICD-10-CM

## 2019-09-01 DIAGNOSIS — Z5112 Encounter for antineoplastic immunotherapy: Secondary | ICD-10-CM | POA: Insufficient documentation

## 2019-09-01 LAB — COMPREHENSIVE METABOLIC PANEL
ALT: 17 U/L (ref 0–44)
AST: 22 U/L (ref 15–41)
Albumin: 3.9 g/dL (ref 3.5–5.0)
Alkaline Phosphatase: 78 U/L (ref 38–126)
Anion gap: 8 (ref 5–15)
BUN: 18 mg/dL (ref 8–23)
CO2: 30 mmol/L (ref 22–32)
Calcium: 9.2 mg/dL (ref 8.9–10.3)
Chloride: 103 mmol/L (ref 98–111)
Creatinine, Ser: 0.87 mg/dL (ref 0.44–1.00)
GFR calc Af Amer: >60 mL/min (ref >60)
GFR calc non Af Amer: >60 mL/min (ref >60)
Glucose, Bld: 116 mg/dL — ABNORMAL HIGH (ref 70–99)
Potassium: 3.5 mmol/L (ref 3.5–5.1)
Sodium: 141 mmol/L (ref 135–145)
Total Bilirubin: 0.8 mg/dL (ref 0.3–1.2)
Total Protein: 6 g/dL — ABNORMAL LOW (ref 6.5–8.1)

## 2019-09-01 LAB — CBC WITH DIFFERENTIAL/PLATELET
Abs Immature Granulocytes: 0.02 10*3/uL (ref 0.00–0.07)
Basophils Absolute: 0.1 10*3/uL (ref 0.0–0.1)
Basophils Relative: 1 %
Eosinophils Absolute: 0.1 10*3/uL (ref 0.0–0.5)
Eosinophils Relative: 2 %
HCT: 40.8 % (ref 36.0–46.0)
Hemoglobin: 13.3 g/dL (ref 12.0–15.0)
Immature Granulocytes: 0 %
Lymphocytes Relative: 22 %
Lymphs Abs: 1.5 10*3/uL (ref 0.7–4.0)
MCH: 30.3 pg (ref 26.0–34.0)
MCHC: 32.6 g/dL (ref 30.0–36.0)
MCV: 92.9 fL (ref 80.0–100.0)
Monocytes Absolute: 0.7 10*3/uL (ref 0.1–1.0)
Monocytes Relative: 10 %
Neutro Abs: 4.5 10*3/uL (ref 1.7–7.7)
Neutrophils Relative %: 65 %
Platelets: 171 10*3/uL (ref 150–400)
RBC: 4.39 MIL/uL (ref 3.87–5.11)
RDW: 13.8 % (ref 11.5–15.5)
WBC: 6.9 10*3/uL (ref 4.0–10.5)
nRBC: 0 % (ref 0.0–0.2)

## 2019-09-01 MED ORDER — HEPARIN SOD (PORK) LOCK FLUSH 100 UNIT/ML IV SOLN
500.0000 [IU] | Freq: Once | INTRAVENOUS | Status: AC | PRN
  Administered 2019-09-01: 500 [IU]

## 2019-09-01 MED ORDER — SODIUM CHLORIDE 0.9 % IV SOLN
375.0000 mg/m2 | Freq: Once | INTRAVENOUS | Status: AC
  Administered 2019-09-01: 600 mg via INTRAVENOUS
  Filled 2019-09-01: qty 50

## 2019-09-01 MED ORDER — ACETAMINOPHEN 325 MG PO TABS
650.0000 mg | ORAL_TABLET | Freq: Once | ORAL | Status: AC
  Administered 2019-09-01: 11:00:00 650 mg via ORAL
  Filled 2019-09-01: qty 2

## 2019-09-01 MED ORDER — SODIUM CHLORIDE 0.9 % IV SOLN
Freq: Once | INTRAVENOUS | Status: AC
  Administered 2019-09-01: 11:00:00 via INTRAVENOUS

## 2019-09-01 MED ORDER — SODIUM CHLORIDE 0.9% FLUSH
10.0000 mL | INTRAVENOUS | Status: DC | PRN
  Administered 2019-09-01: 10 mL
  Filled 2019-09-01: qty 10

## 2019-09-01 MED ORDER — DIPHENHYDRAMINE HCL 25 MG PO CAPS
50.0000 mg | ORAL_CAPSULE | Freq: Once | ORAL | Status: AC
  Administered 2019-09-01: 25 mg via ORAL
  Filled 2019-09-01: qty 2

## 2019-09-01 MED ORDER — SODIUM CHLORIDE 0.9 % IV SOLN
10.0000 mg | Freq: Once | INTRAVENOUS | Status: AC
  Administered 2019-09-01: 10 mg via INTRAVENOUS
  Filled 2019-09-01: qty 10

## 2019-09-01 NOTE — Progress Notes (Signed)
Labs reviewed at office visit today. Proceed with treatment per MD.   Treatment given per orders. Patient tolerated it well without problems. Vitals stable and discharged home from clinic ambulatory. Follow up as scheduled.  

## 2019-09-01 NOTE — Assessment & Plan Note (Signed)
1.  High-grade follicular lymphoma: -Presentation with left inguinal lymph node enlargement, no B symptoms.  Initial needle biopsy on 11/08/2016 consistent with non-Hodgkin's lymphoma. -Excisional biopsy on 11/27/2017 shows high-grade follicular lymphoma (grade 3A). -4 cycles of R-CHOP from 12/28/2017 through 02/28/2018.  She had difficulty breathing after second and third treatments. - Chemotherapy was discontinued after cycle 4.  Maintenance rituximab started on 05/01/2018. - She is tolerating rituximab very well. -CT CAP on 06/26/2019 did not show any evidence of lymphadenopathy.  Spleen was normal. -Physical exam today did not reveal any palpable adenopathy or splenomegaly. -She denied any fevers, night sweats or weight loss in the last 6 months.  We reviewed her blood work which is grossly within normal limits. -She may proceed with her next treatment today.  I plan to repeat scans in March.  She will come back in 2 months for follow-up.  2.  Interstitial lung disease: -She was evaluated by pulmonary and was thought to have interstitial lung disease. - She has some shortness of breath which is stable.  She is not on any treatments.

## 2019-09-01 NOTE — Patient Instructions (Signed)
[facility] Discharge Instructions for Patients Receiving Chemotherapy  Today you received the following chemotherapy agents   To help prevent nausea and vomiting after your treatment, we encourage you to take your nausea medication   If you develop nausea and vomiting that is not controlled by your nausea medication, call the clinic.   BELOW ARE SYMPTOMS THAT SHOULD BE REPORTED IMMEDIATELY:  *FEVER GREATER THAN 100.5 F  *CHILLS WITH OR WITHOUT FEVER  NAUSEA AND VOMITING THAT IS NOT CONTROLLED WITH YOUR NAUSEA MEDICATION  *UNUSUAL SHORTNESS OF BREATH  *UNUSUAL BRUISING OR BLEEDING  TENDERNESS IN MOUTH AND THROAT WITH OR WITHOUT PRESENCE OF ULCERS  *URINARY PROBLEMS  *BOWEL PROBLEMS  UNUSUAL RASH Items with * indicate a potential emergency and should be followed up as soon as possible.  Feel free to call the clinic should you have any questions or concerns. The clinic phone number is [phone].  Please show the CHEMO ALERT CARD at check-in to the Emergency Department and triage nurse.   

## 2019-09-01 NOTE — Patient Instructions (Addendum)
[facility] Discharge Instructions  You were seen today by Dr. Katragadda. He went over your recent lab results. He will see you back in 2 months for labs, treatment and follow up.   Thank you for choosing [facility] at [facility] to provide your oncology and hematology care.  To afford each patient quality time with our provider, please arrive at least 15 minutes before your scheduled appointment time.   If you have a lab appointment with the Cancer Center please come in thru the  Main Entrance and check in at the main information desk  You need to re-schedule your appointment should you arrive 10 or more minutes late.  We strive to give you quality time with our providers, and arriving late affects you and other patients whose appointments are after yours.  Also, if you no show three or more times for appointments you may be dismissed from the clinic at the providers discretion.     Again, thank you for choosing [facility].  Our hope is that these requests will decrease the amount of time that you wait before being seen by our physicians.       _____________________________________________________________  Should you have questions after your visit to [facility], please contact our office at [phone] between the hours of 8:00 a.m. and 4:30 p.m.  Voicemails left after 4:00 p.m. will not be returned until the following business day.  For prescription refill requests, have your pharmacy contact our office and allow 72 hours.    Cancer Center Support Programs:   > Cancer Support Group  2nd Tuesday of the month 1pm-2pm, Journey Room    

## 2019-09-01 NOTE — Progress Notes (Signed)
[place], [place] [address]   CLINIC:  Medical Oncology/Hematology  PCP:  Celene Squibb, MD [place] [address] [phone]   REASON FOR VISIT:  Follow-up for high-grade follicular lymphoma.   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 ([place])   12/28/2017 -  Chemotherapy   The patient had DOXOrubicin (ADRIAMYCIN) chemo injection 84 mg, 50 mg/m2 = 84 mg, Intravenous,  Once, 4 of 4 cycles Administration: 84 mg (12/28/2017), 84 mg (01/18/2018), 84 mg (02/08/2018), 84 mg (02/28/2018) palonosetron (ALOXI) injection 0.25 mg, 0.25 mg, Intravenous,  Once, 4 of 4 cycles Administration: 0.25 mg (12/28/2017), 0.25 mg (01/18/2018), 0.25 mg (02/08/2018), 0.25 mg (02/28/2018) pegfilgrastim (NEULASTA) injection 6 mg, 6 mg, Subcutaneous, Once, 3 of 3 cycles Administration: 6 mg (12/31/2017), 6 mg (01/21/2018), 6 mg (02/11/2018) pegfilgrastim (NEULASTA ONPRO KIT) injection 6 mg, 6 mg, Subcutaneous, Once, [name], 2 mg, Intravenous,  Once, 4 of 4 cycles Administration: 2 mg (12/28/2017), 2 mg (01/18/2018), 2 mg (02/08/2018), 2 mg (02/28/2018) riTUXimab (RITUXAN) 600 mg in sodium chloride 0.9 % 250 mL (1.9355 mg/mL) infusion, 375 mg/m2 = 600 mg, Intravenous,  Once, [name], Cycle 6, Reason: Other (see comments)) Administration: 600 mg (12/28/2017) cyclophosphamide (CYTOXAN) 1,[name], 750 mg/m2 = 1,260 mg, Intravenous,  Once, 4 of 4 cycles Administration: 1,260 mg (12/28/2017), 1,260 mg (01/18/2018), 1,260 mg (02/08/2018), 1,260 mg (02/28/2018) riTUXimab (RITUXAN) 600 mg in sodium chloride 0.9 % 190 mL infusion, 375 mg/m2 =  600 mg, Intravenous,  Once, 12 of 15 cycles Dose modification: 375 mg/m2 (original dose 375 mg/m2, Cycle 7) Administration: 600 mg (01/18/2018), 600 mg (02/08/2018), 600 mg (02/28/2018), 600 mg (05/01/2018), 600 mg (07/01/2018), 600 mg (08/30/2018), 600 mg (10/30/2018), 600 mg (12/30/2018), 600 mg (03/03/2019), 600 mg (05/05/2019), 600 mg (06/30/2019), 600 mg (09/01/2019) Tbo-Filgrastim (GRANIX) injection 480 mcg, 480 mcg (100 % of original dose 480 mcg), Subcutaneous,  Once, [name], Cycle 4)  for chemotherapy treatment.       CANCER STAGING: Cancer Staging No matching staging information was found for the patient.   INTERVAL HISTORY:  Ms. Schild 78 y.o. female seen for follow-up of lymphoma.  She is tolerating rituximab every 2 months very well.  Appetite is 100%.  Energy levels are 100%.  Denies any fevers, night sweats or weight loss.  Denies any infections or hospitalizations.  No nausea, vomiting, diarrhea or constipation reported.  No bleeding per rectum or melena.  No headaches or vision changes.    REVIEW OF SYSTEMS:  Review of Systems  All other systems reviewed and are negative.    PAST MEDICAL/SURGICAL HISTORY:  Past Medical History:  Diagnosis Date  . Arthritis    back and hips  . GERD (gastroesophageal reflux disease)    TUMS  . Hypercholesterolemia   . Hypertension   . Non Hodgkin's lymphoma [facility])    Past Surgical History:  Procedure Laterality Date  . LYMPH NODE BIOPSY Left 11/27/2017   Procedure: LEFT INGUINAL LYMPH NODE BIOPSY;  Surgeon: Stark Klein, MD;  Location: [place];  Service: General;  Laterality: Left;  . PORTACATH PLACEMENT Left 12/12/2017   Procedure: INSERTION PORT-A-CATH;  Surgeon: Stark Klein, MD;  Location: [place];  Service: General;  Laterality: Left;  . TONSILLECTOMY       SOCIAL HISTORY:  Social History   Socioeconomic History  . Marital status: Widowed    Spouse  name: Not on file  . Number of children: Not on file  . Years of education: Not on file  . Highest education level: Not on file  Occupational History  . Not on file  Social Needs  . Financial resource strain: Not on file  . Food insecurity    Worry: Not on file    Inability: Not on file  . Transportation needs    Medical: Not on file    Non-medical: Not on file  Tobacco Use  . Smoking status: Never Smoker  . Smokeless tobacco: Never Used  Substance and Sexual Activity  . Alcohol use: No  . Drug use: No  . Sexual activity: Not on file  Lifestyle  . Physical activity    Days per week: Not on file    Minutes per session: Not on file  . Stress: Not on file  Relationships  . Social Herbalist on phone: Not on file    Gets together: Not on file    Attends religious service: Not on file    Active member of club or organization: Not on file    Attends meetings of clubs or organizations: Not on file    Relationship status: Not on file  . Intimate partner violence    Fear of current or ex partner: Not on file    Emotionally abused: Not on file    Physically abused: Not on file    Forced sexual activity: Not on file  Other Topics Concern  . Not on file  Social History Narrative  . Not on file    FAMILY HISTORY:  Family History  Problem Relation Age of Onset  . Lung cancer Mother   . Kidney cancer Father   . Alcoholism Brother   . Emphysema Maternal Aunt   . Cancer Maternal Uncle   . Cancer Maternal Uncle     CURRENT MEDICATIONS:  Outpatient Encounter Medications as of 09/01/2019  Medication Sig  . bisoprolol-hydrochlorothiazide (ZIAC) 5-6.25 MG tablet Take 1 tablet by mouth 2 (two) times daily.  . Multiple Vitamins-Minerals (ICAPS) TABS Take 1 tablet by mouth 2 (two) times daily.  . riTUXimab (RITUXAN IV) Inject into the vein. Every 2 months  . simvastatin (ZOCOR) 10 MG tablet Take 10 mg by mouth daily.  Marland Kitchen acetaminophen (TYLENOL) 500 MG tablet Take 500 mg  by mouth at bedtime as needed for moderate pain (takes 1/2 tablet at bedtime).   . diphenhydramine-acetaminophen (TYLENOL PM) 25-500 MG TABS tablet Take 0.5 tablets by mouth at bedtime as needed.  . naproxen sodium (ALEVE) 220 MG tablet Take 220 mg by mouth as needed.   No facility-administered encounter medications on file as of 09/01/2019.     ALLERGIES:  No Known Allergies   PHYSICAL EXAM:  ECOG Performance status: 1  Vitals:   09/01/19 0913  BP: 134/64  Pulse: 61  Resp: 16  Temp: (!) 97.4 F (36.3 C)  SpO2: 97%   Filed Weights   09/01/19 0913  Weight: 135 lb (61.2 kg)    Physical Exam Vitals signs reviewed.  Constitutional:      Appearance: Normal appearance.  Cardiovascular:     Rate and Rhythm: Normal rate and regular rhythm.     Heart sounds: Normal heart sounds.  Pulmonary:     Effort: Pulmonary effort is normal.     Breath sounds: Normal breath sounds.  Abdominal:     General: There is no distension.     Palpations: Abdomen is soft. There is no mass.  Musculoskeletal:        General: No swelling.  Skin:    General: Skin is warm.  Neurological:     General: No focal deficit present.     Mental Status: She is alert and oriented to person, place, and time.  Psychiatric:        Mood and Affect: Mood normal.        Behavior: Behavior normal.      LABORATORY DATA:  I have reviewed the labs as listed.  CBC    Component Value Date/Time   WBC 6.9 09/01/2019 0928   RBC 4.39 09/01/2019 0928   HGB 13.3 09/01/2019 0928   HGB 11.6 02/28/2018 0824   HCT 40.8 09/01/2019 0928   PLT 171 09/01/2019 0928   PLT 220 02/28/2018 0824   MCV 92.9 09/01/2019 0928   MCH 30.3 09/01/2019 0928   MCHC 32.6 09/01/2019 0928   RDW 13.8 09/01/2019 0928   LYMPHSABS 1.5 09/01/2019 0928   MONOABS 0.7 09/01/2019 0928   EOSABS 0.1 09/01/2019 0928   BASOSABS 0.1 09/01/2019 0928   CMP Latest Ref Rng & Units 09/01/2019 06/30/2019 06/26/2019  Glucose 70 - 99 mg/dL 116(H) 91 -   BUN 8 - 23 mg/dL 18 19 -  Creatinine 0.44 - 1.00 mg/dL 0.87 0.82 1.00  Sodium 135 - 145 mmol/L 141 140 -  Potassium 3.5 - 5.1 mmol/L 3.5 3.8 -  Chloride 98 - 111 mmol/L 103 104 -  CO2 22 - 32 mmol/L 30 29 -  Calcium 8.9 - 10.3 mg/dL 9.2 9.4 -  Total Protein 6.5 - 8.1 g/dL 6.0(L) 6.3(L) -  Total Bilirubin 0.3 - 1.2 mg/dL 0.8 0.7 -  Alkaline Phos 38 - 126 U/L 78 73 -  AST 15 - 41 U/L 22 21 -  ALT 0 - 44 U/L 17 18 -       DIAGNOSTIC IMAGING:  I have independently reviewed the scans and discussed with the patient.   I have reviewed Venita Lick LPN's note and agree with the documentation.  I personally performed a face-to-face visit, made revisions and my assessment and plan is as follows.    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  High-grade follicular lymphoma: -Presentation with left inguinal lymph node enlargement, no B symptoms.  Initial needle biopsy on 11/08/2016 consistent with non-Hodgkin's lymphoma. -Excisional biopsy on 11/27/2017 shows high-grade follicular lymphoma (grade 3A). -4 cycles of R-CHOP from 12/28/2017 through 02/28/2018.  She had difficulty breathing after second and third treatments. - Chemotherapy was discontinued after cycle 4.  Maintenance rituximab started on 05/01/2018. - She is tolerating rituximab very well. -CT CAP on 06/26/2019 did not show any evidence of lymphadenopathy.  Spleen was normal. -Physical exam today did not reveal any palpable adenopathy or splenomegaly. -She denied any fevers, night sweats or weight loss in the last 6 months.  We reviewed her blood work which is grossly within normal limits. -She may proceed with her next treatment today.  I plan to repeat scans in March.  She will come back in 2  months for follow-up.  2.  Interstitial lung disease: -She was evaluated by pulmonary and was thought to have interstitial lung disease. - She has some shortness of breath which is stable.   She is not on any treatments.   Orders placed this encounter:  No orders of the defined types were placed in this encounter.     Derek Jack, MD [place] [phone]

## 2019-09-09 DIAGNOSIS — Z0000 Encounter for general adult medical examination without abnormal findings: Secondary | ICD-10-CM | POA: Diagnosis not present

## 2019-09-30 DIAGNOSIS — J849 Interstitial pulmonary disease, unspecified: Secondary | ICD-10-CM | POA: Diagnosis not present

## 2019-09-30 DIAGNOSIS — I10 Essential (primary) hypertension: Secondary | ICD-10-CM | POA: Diagnosis not present

## 2019-09-30 DIAGNOSIS — E785 Hyperlipidemia, unspecified: Secondary | ICD-10-CM | POA: Diagnosis not present

## 2019-10-20 DIAGNOSIS — R69 Illness, unspecified: Secondary | ICD-10-CM | POA: Diagnosis not present

## 2019-11-03 ENCOUNTER — Other Ambulatory Visit: Payer: Self-pay

## 2019-11-03 ENCOUNTER — Encounter (HOSPITAL_COMMUNITY): Payer: Self-pay

## 2019-11-03 ENCOUNTER — Inpatient Hospital Stay (HOSPITAL_COMMUNITY): Payer: Medicare HMO

## 2019-11-03 ENCOUNTER — Inpatient Hospital Stay (HOSPITAL_COMMUNITY): Payer: Medicare HMO | Attending: Internal Medicine

## 2019-11-03 ENCOUNTER — Inpatient Hospital Stay (HOSPITAL_BASED_OUTPATIENT_CLINIC_OR_DEPARTMENT_OTHER): Payer: Medicare HMO | Admitting: Hematology

## 2019-11-03 ENCOUNTER — Encounter (HOSPITAL_COMMUNITY): Payer: Self-pay | Admitting: Hematology

## 2019-11-03 VITALS — BP 125/67 | HR 53 | Temp 97.6°F | Resp 18 | Wt 135.4 lb

## 2019-11-03 DIAGNOSIS — C8238 Follicular lymphoma grade IIIa, lymph nodes of multiple sites: Secondary | ICD-10-CM

## 2019-11-03 DIAGNOSIS — Z5112 Encounter for antineoplastic immunotherapy: Secondary | ICD-10-CM | POA: Diagnosis present

## 2019-11-03 DIAGNOSIS — Z7189 Other specified counseling: Secondary | ICD-10-CM

## 2019-11-03 LAB — CBC WITH DIFFERENTIAL/PLATELET
Abs Immature Granulocytes: 0.01 10*3/uL (ref 0.00–0.07)
Basophils Absolute: 0.1 10*3/uL (ref 0.0–0.1)
Basophils Relative: 1 %
Eosinophils Absolute: 0.1 10*3/uL (ref 0.0–0.5)
Eosinophils Relative: 2 %
HCT: 43 % (ref 36.0–46.0)
Hemoglobin: 13.8 g/dL (ref 12.0–15.0)
Immature Granulocytes: 0 %
Lymphocytes Relative: 28 %
Lymphs Abs: 1.7 10*3/uL (ref 0.7–4.0)
MCH: 29.5 pg (ref 26.0–34.0)
MCHC: 32.1 g/dL (ref 30.0–36.0)
MCV: 91.9 fL (ref 80.0–100.0)
Monocytes Absolute: 0.6 10*3/uL (ref 0.1–1.0)
Monocytes Relative: 10 %
Neutro Abs: 3.6 10*3/uL (ref 1.7–7.7)
Neutrophils Relative %: 59 %
Platelets: 149 10*3/uL — ABNORMAL LOW (ref 150–400)
RBC: 4.68 MIL/uL (ref 3.87–5.11)
RDW: 13.2 % (ref 11.5–15.5)
WBC: 6.2 10*3/uL (ref 4.0–10.5)
nRBC: 0 % (ref 0.0–0.2)

## 2019-11-03 LAB — COMPREHENSIVE METABOLIC PANEL
ALT: 17 U/L (ref 0–44)
AST: 22 U/L (ref 15–41)
Albumin: 3.8 g/dL (ref 3.5–5.0)
Alkaline Phosphatase: 67 U/L (ref 38–126)
Anion gap: 8 (ref 5–15)
BUN: 18 mg/dL (ref 8–23)
CO2: 28 mmol/L (ref 22–32)
Calcium: 9.3 mg/dL (ref 8.9–10.3)
Chloride: 104 mmol/L (ref 98–111)
Creatinine, Ser: 0.96 mg/dL (ref 0.44–1.00)
GFR calc Af Amer: >60 mL/min (ref >60)
GFR calc non Af Amer: 56 mL/min — ABNORMAL LOW (ref >60)
Glucose, Bld: 107 mg/dL — ABNORMAL HIGH (ref 70–99)
Potassium: 3.6 mmol/L (ref 3.5–5.1)
Sodium: 140 mmol/L (ref 135–145)
Total Bilirubin: 0.6 mg/dL (ref 0.3–1.2)
Total Protein: 6.1 g/dL — ABNORMAL LOW (ref 6.5–8.1)

## 2019-11-03 MED ORDER — HEPARIN SOD (PORK) LOCK FLUSH 100 UNIT/ML IV SOLN
500.0000 [IU] | Freq: Once | INTRAVENOUS | Status: AC | PRN
  Administered 2019-11-03: 500 [IU]

## 2019-11-03 MED ORDER — ACETAMINOPHEN 325 MG PO TABS
ORAL_TABLET | ORAL | Status: AC
  Filled 2019-11-03: qty 2

## 2019-11-03 MED ORDER — SODIUM CHLORIDE 0.9 % IV SOLN: Freq: Once | INTRAVENOUS | Status: AC

## 2019-11-03 MED ORDER — SODIUM CHLORIDE 0.9 % IV SOLN
375.0000 mg/m2 | Freq: Once | INTRAVENOUS | Status: AC
  Administered 2019-11-03: 600 mg via INTRAVENOUS
  Filled 2019-11-03: qty 10

## 2019-11-03 MED ORDER — SODIUM CHLORIDE 0.9% FLUSH
10.0000 mL | INTRAVENOUS | Status: DC | PRN
  Administered 2019-11-03: 14:00:00 10 mL

## 2019-11-03 MED ORDER — DIPHENHYDRAMINE HCL 25 MG PO CAPS
ORAL_CAPSULE | ORAL | Status: AC
  Filled 2019-11-03: qty 1

## 2019-11-03 MED ORDER — SODIUM CHLORIDE 0.9 % IV SOLN
10.0000 mg | Freq: Once | INTRAVENOUS | Status: AC
  Administered 2019-11-03: 11:00:00 10 mg via INTRAVENOUS
  Filled 2019-11-03: qty 10

## 2019-11-03 MED ORDER — DIPHENHYDRAMINE HCL 25 MG PO CAPS
25.0000 mg | ORAL_CAPSULE | Freq: Once | ORAL | Status: AC
  Administered 2019-11-03: 11:00:00 25 mg via ORAL

## 2019-11-03 MED ORDER — ACETAMINOPHEN 325 MG PO TABS
650.0000 mg | ORAL_TABLET | Freq: Once | ORAL | Status: AC
  Administered 2019-11-03: 11:00:00 650 mg via ORAL

## 2019-11-03 NOTE — Progress Notes (Signed)
11/03/19  OK to treat with pending with Rituxan with pending prior authorization per Gaspar Bidding.  Henreitta Leber, PharmD

## 2019-11-03 NOTE — Progress Notes (Signed)
[place], [place] [address]   CLINIC:  Medical Oncology/Hematology  PCP:  Celene Squibb, MD [place] [address] [phone]   REASON FOR VISIT:  Follow-up for high-grade follicular lymphoma.   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 ([place])   12/28/2017 -  Chemotherapy   The patient had DOXOrubicin (ADRIAMYCIN) chemo injection 84 mg, 50 mg/m2 = 84 mg, Intravenous,  Once, 4 of 4 cycles Administration: 84 mg (12/28/2017), 84 mg (01/18/2018), 84 mg (02/08/2018), 84 mg (02/28/2018) palonosetron (ALOXI) injection 0.25 mg, 0.25 mg, Intravenous,  Once, 4 of 4 cycles Administration: 0.25 mg (12/28/2017), 0.25 mg (01/18/2018), 0.25 mg (02/08/2018), 0.25 mg (02/28/2018) pegfilgrastim (NEULASTA) injection 6 mg, 6 mg, Subcutaneous, Once, 3 of 3 cycles Administration: 6 mg (12/31/2017), 6 mg (01/21/2018), 6 mg (02/11/2018) pegfilgrastim (NEULASTA ONPRO KIT) injection 6 mg, 6 mg, Subcutaneous, Once, [name], 2 mg, Intravenous,  Once, 4 of 4 cycles Administration: 2 mg (12/28/2017), 2 mg (01/18/2018), 2 mg (02/08/2018), 2 mg (02/28/2018) riTUXimab (RITUXAN) 600 mg in sodium chloride 0.9 % 250 mL (1.9355 mg/mL) infusion, 375 mg/m2 = 600 mg, Intravenous,  Once, [name], Cycle 6, Reason: Other (see comments)) Administration: 600 mg (12/28/2017) cyclophosphamide (CYTOXAN) 1,[name], 750 mg/m2 = 1,260 mg, Intravenous,  Once, 4 of 4 cycles Administration: 1,260 mg (12/28/2017), 1,260 mg (01/18/2018), 1,260 mg (02/08/2018), 1,260 mg (02/28/2018) riTUXimab (RITUXAN) 600 mg in sodium chloride 0.9 % 190 mL infusion, 375 mg/m2 =  600 mg, Intravenous,  Once, 13 of 15 cycles Dose modification: 375 mg/m2 (original dose 375 mg/m2, Cycle 7) Administration: 600 mg (01/18/2018), 600 mg (02/08/2018), 600 mg (02/28/2018), 600 mg (05/01/2018), 600 mg (07/01/2018), 600 mg (08/30/2018), 600 mg (10/30/2018), 600 mg (12/30/2018), 600 mg (03/03/2019), 600 mg (05/05/2019), 600 mg (06/30/2019), 600 mg (09/01/2019), 600 mg (11/03/2019) Tbo-Filgrastim (GRANIX) injection 480 mcg, 480 mcg (100 % of original dose 480 mcg), Subcutaneous,  Once, [name], Cycle 4)  for chemotherapy treatment.       CANCER STAGING: Cancer Staging No matching staging information was found for the patient.   INTERVAL HISTORY:  Ms. Stepien 79 y.o. female seen for follow-up of lymphoma.  Appetite and energy levels are 100%.  No new pains reported.  Denies any fevers, night sweats or weight loss in the last 6 months.  She was started on maintenance rituximab on 05/01/2018.  She is continuing to tolerate rituximab very well.    REVIEW OF SYSTEMS:  Review of Systems  All other systems reviewed and are negative.    PAST MEDICAL/SURGICAL HISTORY:  Past Medical History:  Diagnosis Date  . Arthritis    back and hips  . GERD (gastroesophageal reflux disease)    TUMS  . Hypercholesterolemia   . Hypertension   . Non Hodgkin's lymphoma [facility])    Past Surgical History:  Procedure Laterality Date  . LYMPH NODE BIOPSY Left 11/27/2017   Procedure: LEFT INGUINAL LYMPH NODE BIOPSY;  Surgeon: Stark Klein, MD;  Location: [facility];  Service: General;  Laterality: Left;  .  PORTACATH PLACEMENT Left 12/12/2017   Procedure: INSERTION PORT-A-CATH;  Surgeon: Stark Klein, MD;  Location: [facility];  Service: General;  Laterality: Left;  . TONSILLECTOMY       SOCIAL HISTORY:  Social History   Socioeconomic History  . Marital status: Widowed    Spouse name: Not on file  . Number of children: Not on file  .  Years of education: Not on file  . Highest education level: Not on file  Occupational History  . Not on file  Tobacco Use  . Smoking status: Never Smoker  . Smokeless tobacco: Never Used  Substance and Sexual Activity  . Alcohol use: No  . Drug use: No  . Sexual activity: Not on file  Other Topics Concern  . Not on file  Social History Narrative  . Not on file   Social Determinants of Health   Financial Resource Strain:   . Difficulty of Paying Living Expenses: Not on file  Food Insecurity:   . Worried About Charity fundraiser in the Last Year: Not on file  . Ran Out of Food in the Last Year: Not on file  Transportation Needs:   . Lack of Transportation (Medical): Not on file  . Lack of Transportation (Non-Medical): Not on file  Physical Activity:   . Days of Exercise per Week: Not on file  . Minutes of Exercise per Session: Not on file  Stress:   . Feeling of Stress : Not on file  Social Connections:   . Frequency of Communication with Friends and Family: Not on file  . Frequency of Social Gatherings with Friends and Family: Not on file  . Attends Religious Services: Not on file  . Active Member of Clubs or Organizations: Not on file  . Attends Archivist Meetings: Not on file  . Marital Status: Not on file  Intimate Partner Violence:   . Fear of Current or Ex-Partner: Not on file  . Emotionally Abused: Not on file  . Physically Abused: Not on file  . Sexually Abused: Not on file    FAMILY HISTORY:  Family History  Problem Relation Age of Onset  . Lung cancer Mother   . Kidney cancer Father   . Alcoholism Brother   . Emphysema Maternal Aunt   . Cancer Maternal Uncle   . Cancer Maternal Uncle     CURRENT MEDICATIONS:  Outpatient Encounter Medications as of 11/03/2019  Medication Sig  . acetaminophen (TYLENOL) 500 MG tablet Take 500 mg by mouth at bedtime as needed for moderate pain (takes 1/2 tablet at bedtime).   . bisoprolol-hydrochlorothiazide  (ZIAC) 5-6.25 MG tablet Take 1 tablet by mouth 2 (two) times daily.  . diphenhydramine-acetaminophen (TYLENOL PM) 25-500 MG TABS tablet Take 0.5 tablets by mouth at bedtime as needed.  . Multiple Vitamins-Minerals (ICAPS) TABS Take 1 tablet by mouth 2 (two) times daily.  . naproxen sodium (ALEVE) 220 MG tablet Take 220 mg by mouth as needed.  . riTUXimab (RITUXAN IV) Inject into the vein. Every 2 months  . simvastatin (ZOCOR) 10 MG tablet Take 10 mg by mouth daily.   No facility-administered encounter medications on file as of 11/03/2019.    ALLERGIES:  No Known Allergies   PHYSICAL EXAM:  ECOG Performance status: 1  Vitals:   11/03/19 0940  BP: 125/67  Pulse: (!) 53  Resp: 18  Temp: 97.6 F (36.4 C)  SpO2: 98%   Filed Weights   11/03/19 0940  Weight:  135 lb 6.4 oz (61.4 kg)    Physical Exam Vitals reviewed.  Constitutional:      Appearance: Normal appearance.  Cardiovascular:     Rate and Rhythm: Normal rate and regular rhythm.     Heart sounds: Normal heart sounds.  Pulmonary:     Effort: Pulmonary effort is normal.     Breath sounds: Normal breath sounds.  Abdominal:     General: There is no distension.     Palpations: Abdomen is soft. There is no mass.  Musculoskeletal:        General: No swelling.  Skin:    General: Skin is warm.  Neurological:     General: No focal deficit present.     Mental Status: She is alert and oriented to person, place, and time.  Psychiatric:        Mood and Affect: Mood normal.        Behavior: Behavior normal.      LABORATORY DATA:  I have reviewed the labs as listed.  CBC    Component Value Date/Time   WBC 6.2 11/03/2019 0914   RBC 4.68 11/03/2019 0914   HGB 13.8 11/03/2019 0914   HGB 11.6 02/28/2018 0824   HCT 43.0 11/03/2019 0914   PLT 149 (L) 11/03/2019 0914   PLT 220 02/28/2018 0824   MCV 91.9 11/03/2019 0914   MCH 29.5 11/03/2019 0914   MCHC 32.1 11/03/2019 0914   RDW 13.2 11/03/2019 0914   LYMPHSABS 1.7  11/03/2019 0914   MONOABS 0.6 11/03/2019 0914   EOSABS 0.1 11/03/2019 0914   BASOSABS 0.1 11/03/2019 0914   CMP Latest Ref Rng & Units 11/03/2019 09/01/2019 06/30/2019  Glucose 70 - 99 mg/dL 107(H) 116(H) 91  BUN 8 - 23 mg/dL _0 Creatinine 0.44 - 1.00 mg/dL 0.96 0.87 0.82  Sodium 135 - 145 mmol/L 140 141 140  Potassium 3.5 - 5.1 mmol/L 3.6 3.5 3.8  Chloride 98 - 111 mmol/L 104 103 104  CO2 22 - 32 mmol/L _1 Calcium 8.9 - 10.3 mg/dL 9.3 9.2 9.4  Total Protein 6.5 - 8.1 g/dL 6.1(L) 6.0(L) 6.3(L)  Total Bilirubin 0.3 - 1.2 mg/dL 0.6 0.8 0.7  Alkaline Phos 38 - 126 U/L 67 78 73  AST 15 - 41 U/L _2 ALT 0 - 44 U/L _3 DIAGNOSTIC IMAGING:  I have independently reviewed the scans and discussed with the patient.   I have reviewed Venita Lick LPN's note and agree with the documentation.  I personally performed a face-to-face visit, made revisions and my assessment and plan is as follows.    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  High-grade follicular lymphoma: -Presentation with left inguinal lymph node enlargement, no B symptoms.  Initial needle biopsy on 11/08/2016 consistent with non-Hodgkin's lymphoma. -Excisional biopsy on 11/27/2017 shows high-grade follicular lymphoma (grade 3A). -4 cycles of R-CHOP from 12/28/2017 through 02/28/2018.  She had difficulty breathing after second and third treatments. - Chemotherapy was discontinued after cycle 4.  Maintenance rituximab started on 05/01/2018. - She is tolerating rituximab very well. -CT CAP on 06/26/2019 did not show any evidence of lymphadenopathy.  Spleen was normal. -She denies any B symptoms.  No recurrent infections.  No adenopathy. -We reviewed her labs.  She will proceed with maintenance rituximab today. -I will see her back in 2 months for follow-up.  I plan to repeat CT CAP prior to next visit.  2.  Interstitial lung disease: -She was  evaluated by pulmonary and was thought to have interstitial lung disease. -She has dyspnea on exertion.  No active treatments.   Orders placed this encounter:  Orders Placed This Encounter  Procedures  . CT Abdomen Pelvis W Contrast  . CT Chest W Contrast  . CBC with Differential/Platelet  . Comprehensive metabolic panel      Derek Jack, MD [place] [phone]

## 2019-11-03 NOTE — Progress Notes (Signed)
Patient presents today for treatment and follow up visit with Dr. Delton Coombes. Labs within parameters for treatment today. Vital signs within parameters for treatment. Patient has no complaints of any changes since her last visit.

## 2019-11-03 NOTE — Assessment & Plan Note (Signed)
1.  High-grade follicular lymphoma: -Presentation with left inguinal lymph node enlargement, no B symptoms.  Initial needle biopsy on 11/08/2016 consistent with non-Hodgkin's lymphoma. -Excisional biopsy on 11/27/2017 shows high-grade follicular lymphoma (grade 3A). -4 cycles of R-CHOP from 12/28/2017 through 02/28/2018.  She had difficulty breathing after second and third treatments. - Chemotherapy was discontinued after cycle 4.  Maintenance rituximab started on 05/01/2018. - She is tolerating rituximab very well. -CT CAP on 06/26/2019 did not show any evidence of lymphadenopathy.  Spleen was normal. -She denies any B symptoms.  No recurrent infections.  No adenopathy. -We reviewed her labs.  She will proceed with maintenance rituximab today. -I will see her back in 2 months for follow-up.  I plan to repeat CT CAP prior to next visit.  2.  Interstitial lung disease: -She was evaluated by pulmonary and was thought to have interstitial lung disease. -She has dyspnea on exertion.  No active treatments.

## 2019-11-03 NOTE — Progress Notes (Signed)
Patient has been assessed, vital signs and labs have been reviewed by Dr. Katragadda. ANC, Creatinine, LFTs, and Platelets are within treatment parameters per Dr. Katragadda. The patient is good to proceed with treatment at this time.  

## 2019-11-03 NOTE — Patient Instructions (Signed)
[facility] Discharge Instructions for Patients Receiving Chemotherapy  Today you received the following chemotherapy agents   To help prevent nausea and vomiting after your treatment, we encourage you to take your nausea medication   If you develop nausea and vomiting that is not controlled by your nausea medication, call the clinic.   BELOW ARE SYMPTOMS THAT SHOULD BE REPORTED IMMEDIATELY:  *FEVER GREATER THAN 100.5 F  *CHILLS WITH OR WITHOUT FEVER  NAUSEA AND VOMITING THAT IS NOT CONTROLLED WITH YOUR NAUSEA MEDICATION  *UNUSUAL SHORTNESS OF BREATH  *UNUSUAL BRUISING OR BLEEDING  TENDERNESS IN MOUTH AND THROAT WITH OR WITHOUT PRESENCE OF ULCERS  *URINARY PROBLEMS  *BOWEL PROBLEMS  UNUSUAL RASH Items with * indicate a potential emergency and should be followed up as soon as possible.  Feel free to call the clinic should you have any questions or concerns. The clinic phone number is [phone].  Please show the CHEMO ALERT CARD at check-in to the Emergency Department and triage nurse.   

## 2019-11-03 NOTE — Patient Instructions (Addendum)
[place] at [facility] Discharge Instructions  You were seen today by Dr. Delton Coombes. He went over your recent lab results. You will have treatment today. Continue treatment every 2 months. He will schedule you for repeat scans prior to your next visit. He will see you back in 2 months for labs, treatment and follow up.   Thank you for choosing [place] at [facility] to provide your oncology and hematology care.  To afford each patient quality time with our provider, please arrive at least 15 minutes before your scheduled appointment time.   If you have a lab appointment with the [facility] please come in thru the  Main Entrance and check in at the main information desk  You need to re-schedule your appointment should you arrive 10 or more minutes late.  We strive to give you quality time with our providers, and arriving late affects you and other patients whose appointments are after yours.  Also, if you no show three or more times for appointments you may be dismissed from the clinic at the providers discretion.     Again, thank you for choosing [facility].  Our hope is that these requests will decrease the amount of time that you wait before being seen by our physicians.       _____________________________________________________________  Should you have questions after your visit to [facility], please contact our office at [phone] between the hours of 8:00 a.m. and 4:30 p.m.  Voicemails left after 4:00 p.m. will not be returned until the following business day.  For prescription refill requests, have your pharmacy contact our office and allow 72 hours.    Cancer Center Support Programs:   > Cancer Support Group  2nd Tuesday of the month 1pm-2pm, Journey Room

## 2019-11-03 NOTE — Progress Notes (Signed)
Treatment given today per MD orders. Tolerated infusion without adverse affects. Vital signs stable. No complaints at this time. Discharged from clinic ambulatory. F/U with [facility] as scheduled.   

## 2019-11-12 DIAGNOSIS — I10 Essential (primary) hypertension: Secondary | ICD-10-CM | POA: Diagnosis not present

## 2019-11-12 DIAGNOSIS — J849 Interstitial pulmonary disease, unspecified: Secondary | ICD-10-CM | POA: Diagnosis not present

## 2019-11-12 DIAGNOSIS — E785 Hyperlipidemia, unspecified: Secondary | ICD-10-CM | POA: Diagnosis not present

## 2019-12-31 ENCOUNTER — Inpatient Hospital Stay (HOSPITAL_COMMUNITY): Payer: Medicare HMO | Attending: Hematology

## 2019-12-31 ENCOUNTER — Other Ambulatory Visit: Payer: Self-pay

## 2019-12-31 ENCOUNTER — Ambulatory Visit (HOSPITAL_COMMUNITY)
Admission: RE | Admit: 2019-12-31 | Discharge: 2019-12-31 | Disposition: A | Discharge status: Home / Self Care | Payer: Medicare HMO | Source: Ambulatory Visit | Attending: Hematology | Admitting: Hematology

## 2019-12-31 DIAGNOSIS — Z5112 Encounter for antineoplastic immunotherapy: Secondary | ICD-10-CM | POA: Diagnosis not present

## 2019-12-31 DIAGNOSIS — C8238 Follicular lymphoma grade IIIa, lymph nodes of multiple sites: Secondary | ICD-10-CM | POA: Diagnosis not present

## 2019-12-31 DIAGNOSIS — C8590 Non-Hodgkin lymphoma, unspecified, unspecified site: Secondary | ICD-10-CM | POA: Diagnosis not present

## 2019-12-31 LAB — CBC WITH DIFFERENTIAL/PLATELET
Abs Immature Granulocytes: 0.03 10*3/uL (ref 0.00–0.07)
Basophils Absolute: 0.1 10*3/uL (ref 0.0–0.1)
Basophils Relative: 1 %
Eosinophils Absolute: 0.1 10*3/uL (ref 0.0–0.5)
Eosinophils Relative: 1 %
HCT: 44.3 % (ref 36.0–46.0)
Hemoglobin: 14.1 g/dL (ref 12.0–15.0)
Immature Granulocytes: 0 %
Lymphocytes Relative: 30 %
Lymphs Abs: 2.4 10*3/uL (ref 0.7–4.0)
MCH: 29.4 pg (ref 26.0–34.0)
MCHC: 31.8 g/dL (ref 30.0–36.0)
MCV: 92.5 fL (ref 80.0–100.0)
Monocytes Absolute: 0.8 10*3/uL (ref 0.1–1.0)
Monocytes Relative: 10 %
Neutro Abs: 4.5 10*3/uL (ref 1.7–7.7)
Neutrophils Relative %: 58 %
Platelets: 172 10*3/uL (ref 150–400)
RBC: 4.79 MIL/uL (ref 3.87–5.11)
RDW: 13.5 % (ref 11.5–15.5)
WBC: 8 10*3/uL (ref 4.0–10.5)
nRBC: 0 % (ref 0.0–0.2)

## 2019-12-31 LAB — COMPREHENSIVE METABOLIC PANEL
ALT: 23 U/L (ref 0–44)
AST: 26 U/L (ref 15–41)
Albumin: 4 g/dL (ref 3.5–5.0)
Alkaline Phosphatase: 76 U/L (ref 38–126)
Anion gap: 10 (ref 5–15)
BUN: 16 mg/dL (ref 8–23)
CO2: 30 mmol/L (ref 22–32)
Calcium: 9.7 mg/dL (ref 8.9–10.3)
Chloride: 100 mmol/L (ref 98–111)
Creatinine, Ser: 1.03 mg/dL — ABNORMAL HIGH (ref 0.44–1.00)
GFR calc Af Amer: 60 mL/min — ABNORMAL LOW (ref >60)
GFR calc non Af Amer: 52 mL/min — ABNORMAL LOW (ref >60)
Glucose, Bld: 97 mg/dL (ref 70–99)
Potassium: 3.5 mmol/L (ref 3.5–5.1)
Sodium: 140 mmol/L (ref 135–145)
Total Bilirubin: 0.6 mg/dL (ref 0.3–1.2)
Total Protein: 6.3 g/dL — ABNORMAL LOW (ref 6.5–8.1)

## 2019-12-31 MED ORDER — IOHEXOL 300 MG/ML  SOLN
100.0000 mL | Freq: Once | INTRAMUSCULAR | Status: AC | PRN
  Administered 2019-12-31: 100 mL via INTRAVENOUS

## 2020-01-01 NOTE — Progress Notes (Signed)

## 2020-01-05 ENCOUNTER — Inpatient Hospital Stay (HOSPITAL_COMMUNITY): Payer: Medicare HMO

## 2020-01-05 ENCOUNTER — Encounter (HOSPITAL_COMMUNITY): Payer: Self-pay | Admitting: Hematology

## 2020-01-05 ENCOUNTER — Other Ambulatory Visit: Payer: Self-pay

## 2020-01-05 ENCOUNTER — Inpatient Hospital Stay (HOSPITAL_BASED_OUTPATIENT_CLINIC_OR_DEPARTMENT_OTHER): Payer: Medicare HMO | Admitting: Hematology

## 2020-01-05 VITALS — BP 151/67 | HR 57 | Temp 97.0°F | Resp 18

## 2020-01-05 DIAGNOSIS — C8238 Follicular lymphoma grade IIIa, lymph nodes of multiple sites: Secondary | ICD-10-CM

## 2020-01-05 DIAGNOSIS — Z7189 Other specified counseling: Secondary | ICD-10-CM

## 2020-01-05 DIAGNOSIS — Z5112 Encounter for antineoplastic immunotherapy: Secondary | ICD-10-CM | POA: Diagnosis not present

## 2020-01-05 MED ORDER — HEPARIN SOD (PORK) LOCK FLUSH 100 UNIT/ML IV SOLN
500.0000 [IU] | Freq: Once | INTRAVENOUS | Status: AC | PRN
  Administered 2020-01-05: 500 [IU]

## 2020-01-05 MED ORDER — SODIUM CHLORIDE 0.9 % IV SOLN: Freq: Once | INTRAVENOUS | Status: AC

## 2020-01-05 MED ORDER — DIPHENHYDRAMINE HCL 25 MG PO CAPS
25.0000 mg | ORAL_CAPSULE | Freq: Once | ORAL | Status: AC
  Administered 2020-01-05: 11:00:00 25 mg via ORAL
  Filled 2020-01-05: qty 1

## 2020-01-05 MED ORDER — ACETAMINOPHEN 325 MG PO TABS
650.0000 mg | ORAL_TABLET | Freq: Once | ORAL | Status: AC
  Administered 2020-01-05: 650 mg via ORAL
  Filled 2020-01-05: qty 2

## 2020-01-05 MED ORDER — SODIUM CHLORIDE 0.9% FLUSH
10.0000 mL | INTRAVENOUS | Status: DC | PRN
  Administered 2020-01-05: 10 mL

## 2020-01-05 MED ORDER — SODIUM CHLORIDE 0.9 % IV SOLN
10.0000 mg | Freq: Once | INTRAVENOUS | Status: AC
  Administered 2020-01-05: 10 mg via INTRAVENOUS
  Filled 2020-01-05: qty 10

## 2020-01-05 MED ORDER — SODIUM CHLORIDE 0.9 % IV SOLN
375.0000 mg/m2 | Freq: Once | INTRAVENOUS | Status: AC
  Administered 2020-01-05: 600 mg via INTRAVENOUS
  Filled 2020-01-05: qty 60

## 2020-01-05 NOTE — Progress Notes (Signed)
[place] [place], [place] [address]   CLINIC:  Medical Oncology/Hematology  PCP:  Celene Squibb, MD [place] [id number] [phone]   REASON FOR VISIT:  Follow-up for high-grade follicular lymphoma.   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 ([place])   12/28/2017 -  Chemotherapy   The patient had DOXOrubicin (ADRIAMYCIN) chemo injection 84 mg, 50 mg/m2 = 84 mg, Intravenous,  Once, 4 of 4 cycles Administration: 84 mg (12/28/2017), 84 mg (01/18/2018), 84 mg (02/08/2018), 84 mg (02/28/2018) palonosetron (ALOXI) injection 0.25 mg, 0.25 mg, Intravenous,  Once, 4 of 4 cycles Administration: 0.25 mg (12/28/2017), 0.25 mg (01/18/2018), 0.25 mg (02/08/2018), 0.25 mg (02/28/2018) pegfilgrastim (NEULASTA) injection 6 mg, 6 mg, Subcutaneous, Once, 3 of 3 cycles Administration: 6 mg (12/31/2017), 6 mg (01/21/2018), 6 mg (02/11/2018) pegfilgrastim (NEULASTA ONPRO KIT) injection 6 mg, 6 mg, Subcutaneous, Once, [name], 2 mg, Intravenous,  Once, 4 of 4 cycles Administration: 2 mg (12/28/2017), 2 mg (01/18/2018), 2 mg (02/08/2018), 2 mg (02/28/2018) riTUXimab (RITUXAN) 600 mg in sodium chloride 0.9 % 250 mL (1.9355 mg/mL) infusion, 375 mg/m2 = 600 mg, Intravenous,  Once, [name], Cycle 6, Reason: Other (see comments)) Administration: 600 mg (12/28/2017) cyclophosphamide (CYTOXAN) 1,[name], 750 mg/m2 = 1,260 mg, Intravenous,  Once, 4 of 4 cycles Administration: 1,260 mg (12/28/2017), 1,260 mg (01/18/2018), 1,260 mg (02/08/2018), 1,260 mg (02/28/2018) riTUXimab (RITUXAN) 600 mg in sodium chloride 0.9 % 190 mL infusion, 375 mg/m2 =  600 mg, Intravenous,  Once, 14 of 15 cycles Dose modification: 375 mg/m2 (original dose 375 mg/m2, Cycle 7) Administration: 600 mg (01/18/2018), 600 mg (02/08/2018), 600 mg (02/28/2018), 600 mg (05/01/2018), 600 mg (07/01/2018), 600 mg (08/30/2018), 600 mg (10/30/2018), 600 mg (12/30/2018), 600 mg (03/03/2019), 600 mg (05/05/2019), 600 mg (06/30/2019), 600 mg (09/01/2019), 600 mg (11/03/2019), 600 mg (01/05/2020) Tbo-Filgrastim (GRANIX) injection 480 mcg, 480 mcg (100 % of original dose 480 mcg), Subcutaneous,  Once, [name], Cycle 4)  for chemotherapy treatment.       CANCER STAGING: Cancer Staging No matching staging information was found for the patient.   INTERVAL HISTORY:  Alicia [name] 79 y.o. female seen for follow-up of follicular lymphoma.  She is receiving rituximab every 2 months.  Appetite and energy levels are 100%.  Reports occasional dizziness.  Numbness in the feet has been stable.  Occasional sleep problems are also stable.  Denies any fevers, night sweats or weight loss in the last 6 months.  Denies any ER visits or hospitalizations.    REVIEW OF SYSTEMS:  Review of Systems  Neurological: Positive for dizziness and numbness.  Psychiatric/Behavioral: Positive for sleep disturbance.  All other systems reviewed and are negative.    PAST MEDICAL/SURGICAL HISTORY:  Past Medical History:  Diagnosis Date  . Arthritis    back and hips  . GERD (gastroesophageal reflux disease)    TUMS  . Hypercholesterolemia   . Hypertension   . Non Hodgkin's lymphoma [facility])    Past Surgical History:  Procedure Laterality Date  . LYMPH NODE  BIOPSY Left 11/27/2017   Procedure: LEFT INGUINAL LYMPH NODE BIOPSY;  Surgeon: Stark Klein, MD;  Location: [place];  Service: General;  Laterality: Left;  . PORTACATH PLACEMENT Left 12/12/2017   Procedure: INSERTION PORT-A-CATH;  Surgeon: Stark Klein, MD;  Location: [facility];  Service:  General;  Laterality: Left;  . TONSILLECTOMY       SOCIAL HISTORY:  Social History   Socioeconomic History  . Marital status: Widowed    Spouse name: Not on file  . Number of children: Not on file  . Years of education: Not on file  . Highest education level: Not on file  Occupational History  . Not on file  Tobacco Use  . Smoking status: Never Smoker  . Smokeless tobacco: Never Used  Substance and Sexual Activity  . Alcohol use: No  . Drug use: No  . Sexual activity: Not on file  Other Topics Concern  . Not on file  Social History Narrative  . Not on file   Social Determinants of Health   Financial Resource Strain:   . Difficulty of Paying Living Expenses:   Food Insecurity:   . Worried About Charity fundraiser in the Last Year:   . Arboriculturist in the Last Year:   Transportation Needs:   . Film/video editor (Medical):   Marland Kitchen Lack of Transportation (Non-Medical):   Physical Activity:   . Days of Exercise per Week:   . Minutes of Exercise per Session:   Stress:   . Feeling of Stress :   Social Connections:   . Frequency of Communication with Friends and Family:   . Frequency of Social Gatherings with Friends and Family:   . Attends Religious Services:   . Active Member of Clubs or Organizations:   . Attends Archivist Meetings:   Marland Kitchen Marital Status:   Intimate Partner Violence:   . Fear of Current or Ex-Partner:   . Emotionally Abused:   Marland Kitchen Physically Abused:   . Sexually Abused:     FAMILY HISTORY:  Family History  Problem Relation Age of Onset  . Lung cancer Mother   . Kidney cancer Father   . Alcoholism Brother   . Emphysema Maternal Aunt   . Cancer Maternal Uncle   . Cancer Maternal Uncle     CURRENT MEDICATIONS:  Outpatient Encounter Medications as of 01/05/2020  Medication Sig  . bisoprolol-hydrochlorothiazide (ZIAC) 5-6.25 MG tablet Take 1 tablet by mouth 2 (two) times daily.  . diphenhydramine-acetaminophen (TYLENOL PM)  25-500 MG TABS tablet Take 0.5 tablets by mouth at bedtime as needed.  . Multiple Vitamins-Minerals (ICAPS) TABS Take 1 tablet by mouth 2 (two) times daily.  . riTUXimab (RITUXAN IV) Inject into the vein. Every 2 months  . simvastatin (ZOCOR) 10 MG tablet Take 10 mg by mouth daily.  Marland Kitchen acetaminophen (TYLENOL) 500 MG tablet Take 500 mg by mouth at bedtime as needed for moderate pain (takes 1/2 tablet at bedtime).   . naproxen sodium (ALEVE) 220 MG tablet Take 220 mg by mouth as needed.   No facility-administered encounter medications on file as of 01/05/2020.    ALLERGIES:  No Known Allergies   PHYSICAL EXAM:  ECOG Performance status: 1  Vitals:   01/05/20 1026  BP: 116/82  Pulse: 64  Resp: 18  Temp: (!) 96.4 F (35.8 C)  SpO2: 100%   Filed Weights   01/05/20 1026  Weight: 136 lb 4.8 oz (61.8 kg)  Physical Exam Vitals reviewed.  Constitutional:      Appearance: Normal appearance.  Cardiovascular:     Rate and Rhythm: Normal rate and regular rhythm.     Heart sounds: Normal heart sounds.  Pulmonary:     Effort: Pulmonary effort is normal.     Breath sounds: Normal breath sounds.  Abdominal:     General: There is no distension.     Palpations: Abdomen is soft. There is no mass.  Musculoskeletal:        General: No swelling.  Skin:    General: Skin is warm.  Neurological:     General: No focal deficit present.     Mental Status: She is alert and oriented to person, place, and time.  Psychiatric:        Mood and Affect: Mood normal.        Behavior: Behavior normal.      LABORATORY DATA:  I have reviewed the labs as listed.  CBC    Component Value Date/Time   WBC 8.0 12/31/2019 0948   RBC 4.79 12/31/2019 0948   HGB 14.1 12/31/2019 0948   HGB 11.6 02/28/2018 0824   HCT 44.3 12/31/2019 0948   PLT 172 12/31/2019 0948   PLT 220 02/28/2018 0824   MCV 92.5 12/31/2019 0948   MCH 29.4 12/31/2019 0948   MCHC 31.8 12/31/2019 0948   RDW 13.5 12/31/2019 0948    LYMPHSABS 2.4 12/31/2019 0948   MONOABS 0.8 12/31/2019 0948   EOSABS 0.1 12/31/2019 0948   BASOSABS 0.1 12/31/2019 0948   CMP Latest Ref Rng & Units 12/31/2019 11/03/2019 09/01/2019  Glucose 70 - 99 mg/dL 97 107(H) 116(H)  BUN 8 - 23 mg/dL '16 18 18  '$ Creatinine 0.44 - 1.00 mg/dL 1.03(H) 0.96 0.87  Sodium 135 - 145 mmol/L 140 140 141  Potassium 3.5 - 5.1 mmol/L 3.5 3.6 3.5  Chloride 98 - 111 mmol/L 100 104 103  CO2 22 - 32 mmol/L '30 28 30  '$ Calcium 8.9 - 10.3 mg/dL 9.7 9.3 9.2  Total Protein 6.5 - 8.1 g/dL 6.3(L) 6.1(L) 6.0(L)  Total Bilirubin 0.3 - 1.2 mg/dL 0.6 0.6 0.8  Alkaline Phos 38 - 126 U/L 76 67 78  AST 15 - 41 U/L '26 22 22  '$ ALT 0 - 44 U/L '23 17 17       '$ DIAGNOSTIC IMAGING:  I have independently reviewed scans.   I have reviewed Venita Lick LPN's note and agree with the documentation.  I personally performed a face-to-face visit, made revisions and my assessment and plan is as follows.    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  High-grade follicular lymphoma: -Presentation with left iliac lymph node enlargement, no B symptoms, initial nodule biopsy on 11/08/2016 consistent with non-Hodgkin's lymphoma. -Excisional biopsy on 11/27/2017 shows high-grade follicular lymphoma (grade 3A). -4 cycles of R-CHOP from 12/28/2017 through 02/28/2018.  She had difficulty breathing after second and third treatments. -Chemotherapy discontinued after cycle 4.  Maintenance rituximab started on 05/01/2018. -She is tolerating rituximab reasonably well. -We reviewed her labs.  CBC and LFTs are normal. -We reviewed CT CAP from 12/31/2019.  No specific findings to suggest recurrent lymphoma in the chest, abdomen or pelvis.  Small nonspecific pulmonary nodules are identified measuring up to 4 mm.  New small nodule within the right middle lobe measuring 3 mm. -She will proceed with her maintenance treatment rituximab cycle 11 today.  She will  come  back in 2 months for her last treatment.  After that we will see her back every 3 to 6 months.  2.  Interstitial lung disease: -She was evaluated by pulmonary and was thought to have interstitial lung disease. -She has mild dyspnea on exertion which is stable. -CT chest showed small nonspecific pulmonary nodules measuring up to 4 mm.  New small nodule in the right middle lobe measuring 3 mm.  We will follow up on subsequent scans.   Orders placed this encounter:  No orders of the defined types were placed in this encounter.     Derek Jack, MD [place] [phone]

## 2020-01-05 NOTE — Patient Instructions (Signed)
[facility] Discharge Instructions for Patients Receiving Chemotherapy  Today you received the following chemotherapy agents   To help prevent nausea and vomiting after your treatment, we encourage you to take your nausea medication   If you develop nausea and vomiting that is not controlled by your nausea medication, call the clinic.   BELOW ARE SYMPTOMS THAT SHOULD BE REPORTED IMMEDIATELY:  *FEVER GREATER THAN 100.5 F  *CHILLS WITH OR WITHOUT FEVER  NAUSEA AND VOMITING THAT IS NOT CONTROLLED WITH YOUR NAUSEA MEDICATION  *UNUSUAL SHORTNESS OF BREATH  *UNUSUAL BRUISING OR BLEEDING  TENDERNESS IN MOUTH AND THROAT WITH OR WITHOUT PRESENCE OF ULCERS  *URINARY PROBLEMS  *BOWEL PROBLEMS  UNUSUAL RASH Items with * indicate a potential emergency and should be followed up as soon as possible.  Feel free to call the clinic should you have any questions or concerns. The clinic phone number is [phone].  Please show the CHEMO ALERT CARD at check-in to the Emergency Department and triage nurse.   

## 2020-01-05 NOTE — Patient Instructions (Addendum)
[place] at [facility] Discharge Instructions  You were seen today by Dr. Delton Coombes. He went over your recent lab and scan results. Your type of lymphoma usually takes a few years to come back. He would like you to keep your port at this time, you will need to continue a port flush every 3 months. He will see you back in 2 months for labs, treatment and follow up.   Thank you for choosing [place] at [facility] to provide your oncology and hematology care.  To afford each patient quality time with our provider, please arrive at least 15 minutes before your scheduled appointment time.   If you have a lab appointment with the [facility] please come in thru the  Main Entrance and check in at the main information desk  You need to re-schedule your appointment should you arrive 10 or more minutes late.  We strive to give you quality time with our providers, and arriving late affects you and other patients whose appointments are after yours.  Also, if you no show three or more times for appointments you may be dismissed from the clinic at the providers discretion.     Again, thank you for choosing [facility].  Our hope is that these requests will decrease the amount of time that you wait before being seen by our physicians.       _____________________________________________________________  Should you have questions after your visit to [facility], please contact our office at [phone] between the hours of 8:00 a.m. and 4:30 p.m.  Voicemails left after 4:00 p.m. will not be returned until the following business day.  For prescription refill requests, have your pharmacy contact our office and allow 72 hours.    Cancer Center Support Programs:   > Cancer Support Group  2nd Tuesday of the month 1pm-2pm, Journey Room

## 2020-01-05 NOTE — Progress Notes (Signed)
Labs reviewed with MD today. Will proceed as planned.   Treatment given per orders. Patient tolerated it well without problems. Vitals stable and discharged home from clinic ambulatory. Follow up as scheduled.

## 2020-01-05 NOTE — Progress Notes (Signed)
Patient has been assessed, vital signs and labs have been reviewed by Dr. Katragadda. ANC, Creatinine, LFTs, and Platelets are within treatment parameters per Dr. Katragadda. The patient is good to proceed with treatment at this time.  

## 2020-01-05 NOTE — Assessment & Plan Note (Signed)
1.  High-grade follicular lymphoma: -Presentation with left iliac lymph node enlargement, no B symptoms, initial nodule biopsy on 11/08/2016 consistent with non-Hodgkin's lymphoma. -Excisional biopsy on 11/27/2017 shows high-grade follicular lymphoma (grade 3A). -4 cycles of R-CHOP from 12/28/2017 through 02/28/2018.  She had difficulty breathing after second and third treatments. -Chemotherapy discontinued after cycle 4.  Maintenance rituximab started on 05/01/2018. -She is tolerating rituximab reasonably well. -We reviewed her labs.  CBC and LFTs are normal. -We reviewed CT CAP from 12/31/2019.  No specific findings to suggest recurrent lymphoma in the chest, abdomen or pelvis.  Small nonspecific pulmonary nodules are identified measuring up to 4 mm.  New small nodule within the right middle lobe measuring 3 mm. -She will proceed with her maintenance treatment rituximab cycle 11 today.  She will come back in 2 months for her last treatment.  After that we will see her back every 3 to 6 months.  2.  Interstitial lung disease: -She was evaluated by pulmonary and was thought to have interstitial lung disease. -She has mild dyspnea on exertion which is stable. -CT chest showed small nonspecific pulmonary nodules measuring up to 4 mm.  New small nodule in the right middle lobe measuring 3 mm.  We will follow up on subsequent scans.

## 2020-01-08 ENCOUNTER — Ambulatory Visit: Payer: Medicare HMO | Attending: Internal Medicine

## 2020-01-08 DIAGNOSIS — Z23 Encounter for immunization: Secondary | ICD-10-CM

## 2020-01-08 NOTE — Progress Notes (Signed)
   Covid-19 Vaccination Clinic  Name:  Alicia [name]    MRN: [id number] DOB: Aug 17, 1941  01/08/2020  [name] was observed post Covid-19 immunization for 15 minutes without incident. She was provided with Vaccine Information Sheet and instruction to access the V-Safe system.   [name] was instructed to call 911 with any severe reactions post vaccine: Marland Kitchen Difficulty breathing  . Swelling of face and throat  . A fast heartbeat  . A bad rash all over body  . Dizziness and weakness   Immunizations Administered    Name Date Dose VIS Date Route   Moderna COVID-19 Vaccine 01/08/2020 10:49 AM 0.5 mL 09/16/2019 Intramuscular   Manufacturer: Moderna   Lot: [id number]   [id number]

## 2020-01-28 DIAGNOSIS — C8238 Follicular lymphoma grade IIIa, lymph nodes of multiple sites: Secondary | ICD-10-CM | POA: Diagnosis not present

## 2020-01-28 DIAGNOSIS — Z0000 Encounter for general adult medical examination without abnormal findings: Secondary | ICD-10-CM | POA: Diagnosis not present

## 2020-01-28 DIAGNOSIS — H3530 Unspecified macular degeneration: Secondary | ICD-10-CM | POA: Diagnosis not present

## 2020-01-28 DIAGNOSIS — R7301 Impaired fasting glucose: Secondary | ICD-10-CM | POA: Diagnosis not present

## 2020-01-28 DIAGNOSIS — Z2821 Immunization not carried out because of patient refusal: Secondary | ICD-10-CM | POA: Diagnosis not present

## 2020-01-28 DIAGNOSIS — E538 Deficiency of other specified B group vitamins: Secondary | ICD-10-CM | POA: Diagnosis not present

## 2020-01-28 DIAGNOSIS — G4700 Insomnia, unspecified: Secondary | ICD-10-CM | POA: Diagnosis not present

## 2020-01-28 DIAGNOSIS — R739 Hyperglycemia, unspecified: Secondary | ICD-10-CM | POA: Diagnosis not present

## 2020-01-28 DIAGNOSIS — I10 Essential (primary) hypertension: Secondary | ICD-10-CM | POA: Diagnosis not present

## 2020-01-28 DIAGNOSIS — E782 Mixed hyperlipidemia: Secondary | ICD-10-CM | POA: Diagnosis not present

## 2020-01-28 DIAGNOSIS — E785 Hyperlipidemia, unspecified: Secondary | ICD-10-CM | POA: Diagnosis not present

## 2020-01-28 DIAGNOSIS — W57XXXA Bitten or stung by nonvenomous insect and other nonvenomous arthropods, initial encounter: Secondary | ICD-10-CM | POA: Diagnosis not present

## 2020-01-28 DIAGNOSIS — C8510 Unspecified B-cell lymphoma, unspecified site: Secondary | ICD-10-CM | POA: Diagnosis not present

## 2020-01-28 DIAGNOSIS — C8590 Non-Hodgkin lymphoma, unspecified, unspecified site: Secondary | ICD-10-CM | POA: Diagnosis not present

## 2020-01-28 DIAGNOSIS — R69 Illness, unspecified: Secondary | ICD-10-CM | POA: Diagnosis not present

## 2020-01-28 DIAGNOSIS — Z0001 Encounter for general adult medical examination with abnormal findings: Secondary | ICD-10-CM | POA: Diagnosis not present

## 2020-02-02 DIAGNOSIS — R7301 Impaired fasting glucose: Secondary | ICD-10-CM | POA: Diagnosis not present

## 2020-02-02 DIAGNOSIS — N200 Calculus of kidney: Secondary | ICD-10-CM | POA: Diagnosis not present

## 2020-02-02 DIAGNOSIS — E782 Mixed hyperlipidemia: Secondary | ICD-10-CM | POA: Diagnosis not present

## 2020-02-02 DIAGNOSIS — R42 Dizziness and giddiness: Secondary | ICD-10-CM | POA: Diagnosis not present

## 2020-02-02 DIAGNOSIS — J849 Interstitial pulmonary disease, unspecified: Secondary | ICD-10-CM | POA: Diagnosis not present

## 2020-02-02 DIAGNOSIS — I10 Essential (primary) hypertension: Secondary | ICD-10-CM | POA: Diagnosis not present

## 2020-02-02 DIAGNOSIS — R69 Illness, unspecified: Secondary | ICD-10-CM | POA: Diagnosis not present

## 2020-02-02 DIAGNOSIS — C8238 Follicular lymphoma grade IIIa, lymph nodes of multiple sites: Secondary | ICD-10-CM | POA: Diagnosis not present

## 2020-02-02 DIAGNOSIS — Z0001 Encounter for general adult medical examination with abnormal findings: Secondary | ICD-10-CM | POA: Diagnosis not present

## 2020-02-02 DIAGNOSIS — H3530 Unspecified macular degeneration: Secondary | ICD-10-CM | POA: Diagnosis not present

## 2020-02-02 DIAGNOSIS — R918 Other nonspecific abnormal finding of lung field: Secondary | ICD-10-CM | POA: Diagnosis not present

## 2020-02-10 ENCOUNTER — Ambulatory Visit: Payer: Medicare HMO | Attending: Internal Medicine

## 2020-02-10 DIAGNOSIS — Z23 Encounter for immunization: Secondary | ICD-10-CM

## 2020-02-10 NOTE — Progress Notes (Signed)
   Covid-19 Vaccination Clinic  Name:  Alicia [name]    MRN: [id number] DOB: Aug 01, 1941  02/10/2020  [name] was observed post Covid-19 immunization for 15 minutes without incident. She was provided with Vaccine Information Sheet and instruction to access the V-Safe system.   [name] was instructed to call 911 with any severe reactions post vaccine: Marland Kitchen Difficulty breathing  . Swelling of face and throat  . A fast heartbeat  . A bad rash all over body  . Dizziness and weakness   Immunizations Administered    Name Date Dose VIS Date Route   Moderna COVID-19 Vaccine 02/10/2020  9:52 AM 0.5 mL 09/2019 Intramuscular   Manufacturer: Moderna   Lot: [id number]   [id number]

## 2020-02-23 DIAGNOSIS — H524 Presbyopia: Secondary | ICD-10-CM | POA: Diagnosis not present

## 2020-02-23 DIAGNOSIS — H353132 Nonexudative age-related macular degeneration, bilateral, intermediate dry stage: Secondary | ICD-10-CM | POA: Diagnosis not present

## 2020-02-24 DIAGNOSIS — Z0100 Encounter for examination of eyes and vision without abnormal findings: Secondary | ICD-10-CM | POA: Diagnosis not present

## 2020-03-04 ENCOUNTER — Inpatient Hospital Stay (HOSPITAL_COMMUNITY): Payer: Medicare HMO

## 2020-03-04 ENCOUNTER — Other Ambulatory Visit: Payer: Self-pay

## 2020-03-04 ENCOUNTER — Encounter (HOSPITAL_COMMUNITY): Payer: Self-pay | Admitting: Hematology

## 2020-03-04 ENCOUNTER — Inpatient Hospital Stay (HOSPITAL_COMMUNITY): Payer: Medicare HMO | Attending: Hematology | Admitting: Hematology

## 2020-03-04 VITALS — BP 154/80 | HR 61 | Temp 97.3°F | Resp 18 | Wt 136.1 lb

## 2020-03-04 DIAGNOSIS — C8238 Follicular lymphoma grade IIIa, lymph nodes of multiple sites: Secondary | ICD-10-CM

## 2020-03-04 DIAGNOSIS — Z5112 Encounter for antineoplastic immunotherapy: Secondary | ICD-10-CM | POA: Diagnosis present

## 2020-03-04 LAB — CBC WITH DIFFERENTIAL/PLATELET
Abs Immature Granulocytes: 0.02 10*3/uL (ref 0.00–0.07)
Basophils Absolute: 0.1 10*3/uL (ref 0.0–0.1)
Basophils Relative: 1 %
Eosinophils Absolute: 0.2 10*3/uL (ref 0.0–0.5)
Eosinophils Relative: 2 %
HCT: 42.1 % (ref 36.0–46.0)
Hemoglobin: 13.4 g/dL (ref 12.0–15.0)
Immature Granulocytes: 0 %
Lymphocytes Relative: 29 %
Lymphs Abs: 2.4 10*3/uL (ref 0.7–4.0)
MCH: 29.6 pg (ref 26.0–34.0)
MCHC: 31.8 g/dL (ref 30.0–36.0)
MCV: 92.9 fL (ref 80.0–100.0)
Monocytes Absolute: 0.8 10*3/uL (ref 0.1–1.0)
Monocytes Relative: 10 %
Neutro Abs: 4.6 10*3/uL (ref 1.7–7.7)
Neutrophils Relative %: 58 %
Platelets: 178 10*3/uL (ref 150–400)
RBC: 4.53 MIL/uL (ref 3.87–5.11)
RDW: 13.7 % (ref 11.5–15.5)
WBC: 8 10*3/uL (ref 4.0–10.5)
nRBC: 0 % (ref 0.0–0.2)

## 2020-03-04 LAB — COMPREHENSIVE METABOLIC PANEL
ALT: 19 U/L (ref 0–44)
AST: 24 U/L (ref 15–41)
Albumin: 4.1 g/dL (ref 3.5–5.0)
Alkaline Phosphatase: 84 U/L (ref 38–126)
Anion gap: 8 (ref 5–15)
BUN: 18 mg/dL (ref 8–23)
CO2: 27 mmol/L (ref 22–32)
Calcium: 9.2 mg/dL (ref 8.9–10.3)
Chloride: 106 mmol/L (ref 98–111)
Creatinine, Ser: 0.92 mg/dL (ref 0.44–1.00)
GFR calc Af Amer: >60 mL/min (ref >60)
GFR calc non Af Amer: 59 mL/min — ABNORMAL LOW (ref >60)
Glucose, Bld: 100 mg/dL — ABNORMAL HIGH (ref 70–99)
Potassium: 3.8 mmol/L (ref 3.5–5.1)
Sodium: 141 mmol/L (ref 135–145)
Total Bilirubin: 0.6 mg/dL (ref 0.3–1.2)
Total Protein: 6.2 g/dL — ABNORMAL LOW (ref 6.5–8.1)

## 2020-03-04 NOTE — Progress Notes (Signed)
[facility] [address] [place] [address]   CLINIC:  Medical Oncology/Hematology  PCP:  Celene Squibb, MD [address] [name] [address]  [phone]  REASON FOR VISIT:  Follow-up for high-grade follicular lymphoma.  INTERVAL HISTORY:  Ms. Rauth 79 y.o. female returns for routine follow-up for her high-grade follicular lymphoma. Kady was last seen on 01/05/2020. Today is her last treatment of rituximab.  Denies any fevers, night sweats or weight loss.  Denies any recurrent infections.  Bilateral hip pains are stable.   REVIEW OF SYSTEMS:  Review of Systems  Constitutional: Negative for appetite change, chills, diaphoresis, fatigue and fever.  HENT:   Negative for mouth sores, sore throat and trouble swallowing.   Eyes: Negative for eye problems.  Respiratory: Positive for shortness of breath. Negative for cough and wheezing.   Cardiovascular: Negative for chest pain, leg swelling and palpitations.  Gastrointestinal: Negative for abdominal pain, constipation, diarrhea, nausea and vomiting.  Genitourinary: Negative for bladder incontinence, dysuria and frequency.   Musculoskeletal: Negative for arthralgias, back pain and myalgias.  Skin: Negative for rash.  Neurological: Positive for dizziness and numbness. Negative for extremity weakness and headaches.  Hematological: Does not bruise/bleed easily.  Psychiatric/Behavioral: Positive for sleep disturbance (due to pain). Negative for depression. The patient is not nervous/anxious.     PAST MEDICAL/SURGICAL HISTORY:  Past Medical History:  Diagnosis Date  . Arthritis    back and hips  . GERD (gastroesophageal reflux disease)    TUMS  . Hypercholesterolemia   . Hypertension   . Non Hodgkin's lymphoma [facility])    Past Surgical History:  Procedure Laterality Date  . LYMPH NODE BIOPSY Left 11/27/2017   Procedure: LEFT INGUINAL LYMPH NODE BIOPSY;  Surgeon: Stark Klein, MD;  Location: [facility];   Service: General;  Laterality: Left;  . PORTACATH PLACEMENT Left 12/12/2017   Procedure: INSERTION PORT-A-CATH;  Surgeon: Stark Klein, MD;  Location: [facility];  Service: General;  Laterality: Left;  . TONSILLECTOMY      SOCIAL HISTORY:  Social History   Socioeconomic History  . Marital status: Widowed    Spouse name: Not on file  . Number of children: Not on file  . Years of education: Not on file  . Highest education level: Not on file  Occupational History  . Not on file  Tobacco Use  . Smoking status: Never Smoker  . Smokeless tobacco: Never Used  Substance and Sexual Activity  . Alcohol use: No  . Drug use: No  . Sexual activity: Not on file  Other Topics Concern  . Not on file  Social History Narrative  . Not on file   Social Determinants of Health   Financial Resource Strain:   . Difficulty of Paying Living Expenses:   Food Insecurity:   . Worried About Charity fundraiser in the Last Year:   . Arboriculturist in the Last Year:   Transportation Needs:   . Film/video editor (Medical):   Marland Kitchen Lack of Transportation (Non-Medical):   Physical Activity:   . Days of Exercise per Week:   . Minutes of Exercise per Session:   Stress:   . Feeling of Stress :   Social Connections:   . Frequency of Communication with Friends and Family:   . Frequency of Social Gatherings with Friends and Family:   . Attends Religious Services:   . Active Member of Clubs or Organizations:   .  Attends Archivist Meetings:   Marland Kitchen Marital Status:   Intimate Partner Violence:   . Fear of Current or Ex-Partner:   . Emotionally Abused:   Marland Kitchen Physically Abused:   . Sexually Abused:     FAMILY HISTORY:  Family History  Problem Relation Age of Onset  . Lung cancer Mother   . Kidney cancer Father   . Alcoholism Brother   . Emphysema Maternal Aunt   . Cancer Maternal Uncle   . Cancer Maternal Uncle     CURRENT MEDICATIONS:  Current Outpatient Medications    Medication Sig Dispense Refill  . acetaminophen (TYLENOL) 500 MG tablet Take 500 mg by mouth at bedtime as needed for moderate pain (takes 1/2 tablet at bedtime). 1 1/2 tablet QD    . bisoprolol-hydrochlorothiazide (ZIAC) 5-6.25 MG tablet Take 1 tablet by mouth 2 (two) times daily.    . diphenhydramine-acetaminophen (TYLENOL PM) 25-500 MG TABS tablet Take 0.5 tablets by mouth at bedtime as needed.    . Multiple Vitamins-Minerals (ICAPS) TABS Take 1 tablet by mouth 2 (two) times daily.    . riTUXimab (RITUXAN IV) Inject into the vein. Every 2 months    . simvastatin (ZOCOR) 10 MG tablet Take 10 mg by mouth daily.    . naproxen sodium (ALEVE) 220 MG tablet Take 220 mg by mouth as needed.     No current facility-administered medications for this visit.    ALLERGIES:  No Known Allergies  PHYSICAL EXAM:  Performance status (ECOG): 1 - Symptomatic but completely ambulatory  Vitals:   03/04/20 1454  BP: (!) 154/80  Pulse: 61  Resp: 18  Temp: (!) 97.3 F (36.3 C)  SpO2: 100%   Wt Readings from Last 3 Encounters:  03/04/20 136 lb 1.6 oz (61.7 kg)  01/05/20 136 lb 4.8 oz (61.8 kg)  11/03/19 135 lb 6.4 oz (61.4 kg)   Physical Exam Constitutional:      General: She is not in acute distress.    Appearance: Normal appearance. She is normal weight. She is not ill-appearing.  HENT:     Nose: No congestion or rhinorrhea.     Mouth/Throat:     Mouth: Mucous membranes are moist.     Pharynx: No oropharyngeal exudate or posterior oropharyngeal erythema.  Eyes:     Extraocular Movements: Extraocular movements intact.     Pupils: Pupils are equal, round, and reactive to light.  Cardiovascular:     Rate and Rhythm: Normal rate and regular rhythm.     Pulses: Normal pulses.     Heart sounds: Normal heart sounds. No murmur. No friction rub. No gallop.   Pulmonary:     Effort: Pulmonary effort is normal.     Breath sounds: Normal breath sounds. No wheezing, rhonchi or rales.  Abdominal:      Palpations: There is no mass.     Tenderness: There is no abdominal tenderness. There is no guarding.  Musculoskeletal:        General: No swelling or tenderness.     Right lower leg: No edema.     Left lower leg: No edema.  Skin:    Findings: No bruising or erythema.  Neurological:     Mental Status: She is alert and oriented to person, place, and time.     Sensory: No sensory deficit.  Psychiatric:        Mood and Affect: Mood normal.        Behavior: Behavior normal.  Thought Content: Thought content normal.        Judgment: Judgment normal.     LABORATORY DATA:  I have reviewed the labs as listed.  CBC Latest Ref Rng & Units 03/04/2020 12/31/2019 11/03/2019  WBC 4.0 - 10.5 K/uL 8.0 8.0 6.2  Hemoglobin 12.0 - 15.0 g/dL 13.4 14.1 13.8  Hematocrit 36.0 - 46.0 % 42.1 44.3 43.0  Platelets 150 - 400 K/uL 178 172 149(L)   CMP Latest Ref Rng & Units 03/04/2020 12/31/2019 11/03/2019  Glucose 70 - 99 mg/dL 100(H) 97 107(H)  BUN 8 - 23 mg/dL 18 16 18   Creatinine 0.44 - 1.00 mg/dL 0.92 1.03(H) 0.96  Sodium 135 - 145 mmol/L 141 140 140  Potassium 3.5 - 5.1 mmol/L 3.8 3.5 3.6  Chloride 98 - 111 mmol/L 106 100 104  CO2 22 - 32 mmol/L 27 30 28   Calcium 8.9 - 10.3 mg/dL 9.2 9.7 9.3  Total Protein 6.5 - 8.1 g/dL 6.2(L) 6.3(L) 6.1(L)  Total Bilirubin 0.3 - 1.2 mg/dL 0.6 0.6 0.6  Alkaline Phos 38 - 126 U/L 84 76 67  AST 15 - 41 U/L 24 26 22   ALT 0 - 44 U/L 19 23 17        Component Value Date/Time   RBC 4.53 03/04/2020 1357   MCV 92.9 03/04/2020 1357   MCH 29.6 03/04/2020 1357   MCHC 31.8 03/04/2020 1357   RDW 13.7 03/04/2020 1357   LYMPHSABS 2.4 03/04/2020 1357   MONOABS 0.8 03/04/2020 1357   EOSABS 0.2 03/04/2020 1357   BASOSABS 0.1 03/04/2020 1357    DIAGNOSTIC IMAGING:  I have independently reviewed the scans and discussed with the patient.  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  High-grade follicular  lymphoma: -Presentation with left iliac lymph node enlargement, no B symptoms, initial node biopsy on 10/19/2016 consistent with non-Hodgkin's lymphoma. -Excisional biopsy on 11/27/2017 shows high-grade follicular lymphoma (grade 3A). -4 cycles of R-CHOP from 12/28/2017 through 02/28/2018.  She had difficulty breathing after second and third treatments. -Chemotherapy discontinued after cycle 4.  Maintenance rituximab started on 05/01/2018. -CT CAP on 12/31/2019 showed no specific findings to suggest recurrent lymphoma in the chest, abdomen or pelvis.  Small nonspecific lung nodules. -Physical exam was normal.  Labs reviewed shows normal LFTs and CBC.  We will proceed with her last rituximab today. -Plan to see her back in 4 months with repeat CT scan and labs.  2.  Interstitial lung disease: -Evaluated by pulmonary and was thought to have interstitial lung disease. -Mild dyspnea on exertion is stable. -Recent CT chest on 12/31/2019 showed nonspecific lung nodules measuring up to 4 mm.  New small nodule in the right middle lobe measuring 3 mm.  We will follow on subsequent scan.    Orders placed this encounter:  Orders Placed This Encounter  Procedures  . CT Chest W Contrast  . CT Abdomen Pelvis W Contrast  . CBC with Differential/Platelet  . Comprehensive metabolic panel        Derek Jack, MD, 03/04/20 6:24 PM  [place] [phone]   I, Jacqualyn Posey, am acting as a scribe for Dr. Sanda Linger.  I, Derek Jack MD, have reviewed the above documentation for accuracy and completeness, and I agree with the above.

## 2020-03-04 NOTE — Patient Instructions (Signed)
[place] at [facility] Discharge Instructions  You were seen today by Dr. Delton Coombes. He went over your recent results. Congratulations on completing your treatment tomorrow. We will schedule for a chest, abdomen, and pelvis CT. He will see you back in 4 months for labs and follow up.   Thank you for choosing [place] at [facility] to provide your oncology and hematology care.  To afford each patient quality time with our provider, please arrive at least 15 minutes before your scheduled appointment time.   If you have a lab appointment with the [facility] please come in thru the  Main Entrance and check in at the main information desk  You need to re-schedule your appointment should you arrive 10 or more minutes late.  We strive to give you quality time with our providers, and arriving late affects you and other patients whose appointments are after yours.  Also, if you no show three or more times for appointments you may be dismissed from the clinic at the providers discretion.     Again, thank you for choosing [facility].  Our hope is that these requests will decrease the amount of time that you wait before being seen by our physicians.       _____________________________________________________________  Should you have questions after your visit to [facility], please contact our office at [phone] between the hours of 8:00 a.m. and 4:30 p.m.  Voicemails left after 4:00 p.m. will not be returned until the following business day.  For prescription refill requests, have your pharmacy contact our office and allow 72 hours.    Cancer Center Support Programs:   > Cancer Support Group  2nd Tuesday of the month 1pm-2pm, Journey Room

## 2020-03-04 NOTE — Assessment & Plan Note (Signed)
1.  High-grade follicular lymphoma: -Presentation with left iliac lymph node enlargement, no B symptoms, initial node biopsy on 10/19/2016 consistent with non-Hodgkin's lymphoma. -Excisional biopsy on 11/27/2017 shows high-grade follicular lymphoma (grade 3A). -4 cycles of R-CHOP from 12/28/2017 through 02/28/2018.  She had difficulty breathing after second and third treatments. -Chemotherapy discontinued after cycle 4.  Maintenance rituximab started on 05/01/2018. -CT CAP on 12/31/2019 showed no specific findings to suggest recurrent lymphoma in the chest, abdomen or pelvis.  Small nonspecific lung nodules. -Physical exam was normal.  Labs reviewed shows normal LFTs and CBC.  We will proceed with her last rituximab today. -Plan to see her back in 4 months with repeat CT scan and labs.  2.  Interstitial lung disease: -Evaluated by pulmonary and was thought to have interstitial lung disease. -Mild dyspnea on exertion is stable. -Recent CT chest on 12/31/2019 showed nonspecific lung nodules measuring up to 4 mm.  New small nodule in the right middle lobe measuring 3 mm.  We will follow on subsequent scan.

## 2020-03-05 ENCOUNTER — Inpatient Hospital Stay (HOSPITAL_COMMUNITY): Payer: Medicare HMO

## 2020-03-05 VITALS — BP 131/73 | HR 61 | Temp 97.0°F | Resp 18

## 2020-03-05 DIAGNOSIS — Z7189 Other specified counseling: Secondary | ICD-10-CM

## 2020-03-05 DIAGNOSIS — Z5112 Encounter for antineoplastic immunotherapy: Secondary | ICD-10-CM | POA: Diagnosis not present

## 2020-03-05 DIAGNOSIS — C8238 Follicular lymphoma grade IIIa, lymph nodes of multiple sites: Secondary | ICD-10-CM

## 2020-03-05 MED ORDER — ACETAMINOPHEN 325 MG PO TABS
650.0000 mg | ORAL_TABLET | Freq: Once | ORAL | Status: AC
  Administered 2020-03-05: 650 mg via ORAL
  Filled 2020-03-05: qty 2

## 2020-03-05 MED ORDER — SODIUM CHLORIDE 0.9% FLUSH
10.0000 mL | INTRAVENOUS | Status: DC | PRN
  Administered 2020-03-05 (×2): 10 mL

## 2020-03-05 MED ORDER — SODIUM CHLORIDE 0.9 % IV SOLN: Freq: Once | INTRAVENOUS | Status: AC

## 2020-03-05 MED ORDER — DIPHENHYDRAMINE HCL 25 MG PO CAPS
25.0000 mg | ORAL_CAPSULE | Freq: Once | ORAL | Status: AC
  Administered 2020-03-05: 25 mg via ORAL
  Filled 2020-03-05: qty 1

## 2020-03-05 MED ORDER — HEPARIN SOD (PORK) LOCK FLUSH 100 UNIT/ML IV SOLN
500.0000 [IU] | Freq: Once | INTRAVENOUS | Status: AC | PRN
  Administered 2020-03-05: 500 [IU]

## 2020-03-05 MED ORDER — SODIUM CHLORIDE 0.9 % IV SOLN
10.0000 mg | Freq: Once | INTRAVENOUS | Status: AC
  Administered 2020-03-05: 10 mg via INTRAVENOUS
  Filled 2020-03-05: qty 10

## 2020-03-05 MED ORDER — SODIUM CHLORIDE 0.9 % IV SOLN
375.0000 mg/m2 | Freq: Once | INTRAVENOUS | Status: AC
  Administered 2020-03-05: 600 mg via INTRAVENOUS
  Filled 2020-03-05: qty 60

## 2020-03-05 NOTE — Progress Notes (Signed)
Treatment given per orders. Patient tolerated it well without problems. Vitals stable and discharged home from clinic ambulatory. Follow up as scheduled.  

## 2020-03-05 NOTE — Progress Notes (Signed)
.  Pharmacist Chemotherapy Monitoring - Follow Up Assessment    I verify that I have reviewed each item in the below checklist:  . Regimen for the patient is scheduled for the appropriate day and plan matches scheduled date. Marland Kitchen Appropriate non-routine labs are ordered dependent on drug ordered. . If applicable, additional medications reviewed and ordered per protocol based on lifetime cumulative doses and/or treatment regimen.   Plan for follow-up and/or issues identified: No . I-vent associated with next due treatment: No . MD and/or nursing notified: No  . Last dose 03/05/20  Alicia [name] 03/05/2020 10:15 AM

## 2020-03-05 NOTE — Patient Instructions (Signed)
[facility] Discharge Instructions for Patients Receiving Chemotherapy  Today you received the following chemotherapy agents   To help prevent nausea and vomiting after your treatment, we encourage you to take your nausea medication   If you develop nausea and vomiting that is not controlled by your nausea medication, call the clinic.   BELOW ARE SYMPTOMS THAT SHOULD BE REPORTED IMMEDIATELY:  *FEVER GREATER THAN 100.5 F  *CHILLS WITH OR WITHOUT FEVER  NAUSEA AND VOMITING THAT IS NOT CONTROLLED WITH YOUR NAUSEA MEDICATION  *UNUSUAL SHORTNESS OF BREATH  *UNUSUAL BRUISING OR BLEEDING  TENDERNESS IN MOUTH AND THROAT WITH OR WITHOUT PRESENCE OF ULCERS  *URINARY PROBLEMS  *BOWEL PROBLEMS  UNUSUAL RASH Items with * indicate a potential emergency and should be followed up as soon as possible.  Feel free to call the clinic should you have any questions or concerns. The clinic phone number is [phone].  Please show the CHEMO ALERT CARD at check-in to the Emergency Department and triage nurse.   

## 2020-06-23 ENCOUNTER — Other Ambulatory Visit: Payer: Self-pay

## 2020-06-23 ENCOUNTER — Inpatient Hospital Stay (HOSPITAL_COMMUNITY): Payer: Medicare HMO | Attending: Hematology

## 2020-06-23 ENCOUNTER — Encounter (HOSPITAL_COMMUNITY): Payer: Self-pay

## 2020-06-23 DIAGNOSIS — C8238 Follicular lymphoma grade IIIa, lymph nodes of multiple sites: Secondary | ICD-10-CM

## 2020-06-23 DIAGNOSIS — D696 Thrombocytopenia, unspecified: Secondary | ICD-10-CM | POA: Diagnosis not present

## 2020-06-23 DIAGNOSIS — Z452 Encounter for adjustment and management of vascular access device: Secondary | ICD-10-CM | POA: Diagnosis not present

## 2020-06-23 DIAGNOSIS — C8285 Other types of follicular lymphoma, lymph nodes of inguinal region and lower limb: Secondary | ICD-10-CM | POA: Insufficient documentation

## 2020-06-23 DIAGNOSIS — R69 Illness, unspecified: Secondary | ICD-10-CM | POA: Diagnosis not present

## 2020-06-23 LAB — COMPREHENSIVE METABOLIC PANEL
ALT: 17 U/L (ref 0–44)
AST: 22 U/L (ref 15–41)
Albumin: 4 g/dL (ref 3.5–5.0)
Alkaline Phosphatase: 77 U/L (ref 38–126)
Anion gap: 8 (ref 5–15)
BUN: 16 mg/dL (ref 8–23)
CO2: 28 mmol/L (ref 22–32)
Calcium: 9.1 mg/dL (ref 8.9–10.3)
Chloride: 105 mmol/L (ref 98–111)
Creatinine, Ser: 0.78 mg/dL (ref 0.44–1.00)
GFR calc Af Amer: >60 mL/min (ref >60)
GFR calc non Af Amer: >60 mL/min (ref >60)
Glucose, Bld: 83 mg/dL (ref 70–99)
Potassium: 3.7 mmol/L (ref 3.5–5.1)
Sodium: 141 mmol/L (ref 135–145)
Total Bilirubin: 0.9 mg/dL (ref 0.3–1.2)
Total Protein: 6.2 g/dL — ABNORMAL LOW (ref 6.5–8.1)

## 2020-06-23 LAB — CBC WITH DIFFERENTIAL/PLATELET
Abs Immature Granulocytes: 0.01 10*3/uL (ref 0.00–0.07)
Basophils Absolute: 0.1 10*3/uL (ref 0.0–0.1)
Basophils Relative: 1 %
Eosinophils Absolute: 0.1 10*3/uL (ref 0.0–0.5)
Eosinophils Relative: 1 %
HCT: 41.4 % (ref 36.0–46.0)
Hemoglobin: 13.4 g/dL (ref 12.0–15.0)
Immature Granulocytes: 0 %
Lymphocytes Relative: 28 %
Lymphs Abs: 1.7 10*3/uL (ref 0.7–4.0)
MCH: 29.8 pg (ref 26.0–34.0)
MCHC: 32.4 g/dL (ref 30.0–36.0)
MCV: 92.2 fL (ref 80.0–100.0)
Monocytes Absolute: 0.6 10*3/uL (ref 0.1–1.0)
Monocytes Relative: 9 %
Neutro Abs: 3.6 10*3/uL (ref 1.7–7.7)
Neutrophils Relative %: 61 %
Platelets: 143 10*3/uL — ABNORMAL LOW (ref 150–400)
RBC: 4.49 MIL/uL (ref 3.87–5.11)
RDW: 14.3 % (ref 11.5–15.5)
WBC: 6 10*3/uL (ref 4.0–10.5)
nRBC: 0 % (ref 0.0–0.2)

## 2020-06-23 MED ORDER — SODIUM CHLORIDE 0.9% FLUSH
10.0000 mL | Freq: Once | INTRAVENOUS | Status: AC
  Administered 2020-06-23: 10 mL via INTRAVENOUS

## 2020-06-23 MED ORDER — HEPARIN SOD (PORK) LOCK FLUSH 100 UNIT/ML IV SOLN
500.0000 [IU] | Freq: Once | INTRAVENOUS | Status: AC
  Administered 2020-06-23: 500 [IU] via INTRAVENOUS

## 2020-06-23 NOTE — Progress Notes (Signed)
Patients port flushed without difficulty.  Good blood return noted with no bruising or swelling noted at site.  Band aid applied.  VSS with discharge and left ambulatory with no s/s of distress noted.  

## 2020-06-29 ENCOUNTER — Other Ambulatory Visit: Payer: Self-pay

## 2020-06-29 ENCOUNTER — Ambulatory Visit (HOSPITAL_COMMUNITY)
Admission: RE | Admit: 2020-06-29 | Discharge: 2020-06-29 | Disposition: A | Discharge status: Home / Self Care | Payer: Medicare HMO | Source: Ambulatory Visit | Attending: Hematology | Admitting: Hematology

## 2020-06-29 DIAGNOSIS — C8238 Follicular lymphoma grade IIIa, lymph nodes of multiple sites: Secondary | ICD-10-CM | POA: Diagnosis not present

## 2020-06-29 DIAGNOSIS — R918 Other nonspecific abnormal finding of lung field: Secondary | ICD-10-CM | POA: Diagnosis not present

## 2020-06-29 DIAGNOSIS — C8201 Follicular lymphoma grade I, lymph nodes of head, face, and neck: Secondary | ICD-10-CM | POA: Diagnosis not present

## 2020-06-29 DIAGNOSIS — I700 Atherosclerosis of aorta: Secondary | ICD-10-CM | POA: Diagnosis not present

## 2020-06-29 DIAGNOSIS — C8290 Follicular lymphoma, unspecified, unspecified site: Secondary | ICD-10-CM | POA: Diagnosis not present

## 2020-06-29 MED ORDER — IOHEXOL 300 MG/ML  SOLN
100.0000 mL | Freq: Once | INTRAMUSCULAR | Status: AC | PRN
  Administered 2020-06-29: 100 mL via INTRAVENOUS

## 2020-07-05 ENCOUNTER — Other Ambulatory Visit: Payer: Self-pay

## 2020-07-05 ENCOUNTER — Inpatient Hospital Stay (HOSPITAL_BASED_OUTPATIENT_CLINIC_OR_DEPARTMENT_OTHER): Payer: Medicare HMO | Admitting: Hematology

## 2020-07-05 VITALS — BP 179/98 | HR 55 | Temp 96.9°F | Resp 18 | Wt 129.9 lb

## 2020-07-05 DIAGNOSIS — C8238 Follicular lymphoma grade IIIa, lymph nodes of multiple sites: Secondary | ICD-10-CM | POA: Diagnosis not present

## 2020-07-05 DIAGNOSIS — C8285 Other types of follicular lymphoma, lymph nodes of inguinal region and lower limb: Secondary | ICD-10-CM | POA: Diagnosis not present

## 2020-07-05 NOTE — Progress Notes (Signed)
[facility] [address]  [address]   CLINIC:  Medical Oncology/Hematology  PCP:  Celene Squibb, MD [name] [place] [address]  [phone]  REASON FOR VISIT:  Follow-up for high-grade follicular lymphoma  PRIOR THERAPY:  1. R-CHOP x 4 cycles from 12/28/2017 to 02/28/2018. 2. Maintenance rituximab from 05/01/2018 to 03/05/2020.  CURRENT THERAPY: Observation  INTERVAL HISTORY:  [name], a 79 y.o. female, returns for routine follow-up for her high-grade follicular lymphoma. Alicia [name] was last seen on 03/04/2020.  Today she reports feeling fine and denies having any recent infections, F/C, night sweats, or unintentional weight loss.   REVIEW OF SYSTEMS:  Review of Systems  Constitutional: Positive for fatigue (mild). Negative for appetite change, chills, diaphoresis, fever and unexpected weight change.  All other systems reviewed and are negative.   PAST MEDICAL/SURGICAL HISTORY:  Past Medical History:  Diagnosis Date  . Arthritis    back and hips  . GERD (gastroesophageal reflux disease)    TUMS  . Hypercholesterolemia   . Hypertension   . Non Hodgkin's lymphoma [facility])    Past Surgical History:  Procedure Laterality Date  . LYMPH NODE BIOPSY Left 11/27/2017   Procedure: LEFT INGUINAL LYMPH NODE BIOPSY;  Surgeon: Stark Klein, MD;  Location: [place];  Service: General;  Laterality: Left;  . PORTACATH PLACEMENT Left 12/12/2017   Procedure: INSERTION PORT-A-CATH;  Surgeon: Stark Klein, MD;  Location: [facility];  Service: General;  Laterality: Left;  . TONSILLECTOMY      SOCIAL HISTORY:  Social History   Socioeconomic History  . Marital status: Widowed    Spouse name: Not on file  . Number of children: Not on file  . Years of education: Not on file  . Highest education level: Not on file  Occupational History  . Not on file  Tobacco Use  . Smoking status: Never Smoker  . Smokeless  tobacco: Never Used  Vaping Use  . Vaping Use: Never used  Substance and Sexual Activity  . Alcohol use: No  . Drug use: No  . Sexual activity: Not on file  Other Topics Concern  . Not on file  Social History Narrative  . Not on file   Social Determinants of Health   Financial Resource Strain:   . Difficulty of Paying Living Expenses: Not on file  Food Insecurity:   . Worried About Charity fundraiser in the Last Year: Not on file  . Ran Out of Food in the Last Year: Not on file  Transportation Needs:   . Lack of Transportation (Medical): Not on file  . Lack of Transportation (Non-Medical): Not on file  Physical Activity:   . Days of Exercise per Week: Not on file  . Minutes of Exercise per Session: Not on file  Stress:   . Feeling of Stress : Not on file  Social Connections:   . Frequency of Communication with Friends and Family: Not on file  . Frequency of Social Gatherings with Friends and Family: Not on file  . Attends Religious Services: Not on file  . Active Member of Clubs or Organizations: Not on file  . Attends Archivist Meetings: Not on file  . Marital Status: Not on file  Intimate Partner Violence:   . Fear of Current or Ex-Partner: Not on file  . Emotionally Abused: Not on file  . Physically Abused: Not on file  . Sexually Abused:  Not on file    FAMILY HISTORY:  Family History  Problem Relation Age of Onset  . Lung cancer Mother   . Kidney cancer Father   . Alcoholism Brother   . Emphysema Maternal Aunt   . Cancer Maternal Uncle   . Cancer Maternal Uncle     CURRENT MEDICATIONS:  Current Outpatient Medications  Medication Sig Dispense Refill  . acetaminophen (TYLENOL) 500 MG tablet Take 500 mg by mouth at bedtime as needed for moderate pain (takes 1 tablet at bedtime). 1 1/2 tablet QD    . bisoprolol-hydrochlorothiazide (ZIAC) 5-6.25 MG tablet Take 1 tablet by mouth 2 (two) times daily.    . diphenhydramine-acetaminophen (TYLENOL PM)  25-500 MG TABS tablet Take 0.5 tablets by mouth at bedtime as needed.    . Multiple Vitamins-Minerals (ICAPS) TABS Take 1 tablet by mouth 2 (two) times daily.    . naproxen sodium (ALEVE) 220 MG tablet Take 220 mg by mouth as needed.    . simvastatin (ZOCOR) 10 MG tablet Take 10 mg by mouth daily.     No current facility-administered medications for this visit.    ALLERGIES:  No Known Allergies  PHYSICAL EXAM:  Performance status (ECOG): 1 - Symptomatic but completely ambulatory  Vitals:   07/05/20 1416  BP: (!) 179/98  Pulse: (!) 55  Resp: 18  Temp: (!) 96.9 F (36.1 C)  SpO2: 96%   Wt Readings from Last 3 Encounters:  07/05/20 129 lb 14.4 oz (58.9 kg)  03/04/20 136 lb 1.6 oz (61.7 kg)  01/05/20 136 lb 4.8 oz (61.8 kg)   Physical Exam Vitals reviewed.  Constitutional:      Appearance: Normal appearance.  Cardiovascular:     Rate and Rhythm: Normal rate and regular rhythm.     Pulses: Normal pulses.     Heart sounds: Normal heart sounds.  Pulmonary:     Effort: Pulmonary effort is normal.     Breath sounds: Normal breath sounds.  Abdominal:     Palpations: Abdomen is soft. There is no hepatomegaly, splenomegaly or mass.     Tenderness: There is no abdominal tenderness.     Hernia: No hernia is present.  Lymphadenopathy:     Cervical: No cervical adenopathy.     Upper Body:     Right upper body: No supraclavicular, axillary or pectoral adenopathy.     Left upper body: No supraclavicular, axillary or pectoral adenopathy.     Lower Body: No right inguinal adenopathy. No left inguinal adenopathy.  Neurological:     General: No focal deficit present.     Mental Status: She is alert and oriented to person, place, and time.  Psychiatric:        Mood and Affect: Mood normal.        Behavior: Behavior normal.     LABORATORY DATA:  I have reviewed the labs as listed.  CBC Latest Ref Rng & Units 06/23/2020 03/04/2020 12/31/2019  WBC 4.0 - 10.5 K/uL 6.0 8.0 8.0    Hemoglobin 12.0 - 15.0 g/dL 13.4 13.4 14.1  Hematocrit 36 - 46 % 41.4 42.1 44.3  Platelets 150 - 400 K/uL 143(L) 178 172   CMP Latest Ref Rng & Units 06/23/2020 03/04/2020 12/31/2019  Glucose 70 - 99 mg/dL 83 100(H) 97  BUN 8 - 23 mg/dL 16 18 16   Creatinine 0.44 - 1.00 mg/dL 0.78 0.92 1.03(H)  Sodium 135 - 145 mmol/L 141 141 140  Potassium 3.5 - 5.1 mmol/L 3.7 3.8 3.5  Chloride 98 - 111 mmol/L 105 106 100  CO2 22 - 32 mmol/L 28 27 30   Calcium 8.9 - 10.3 mg/dL 9.1 9.2 9.7  Total Protein 6.5 - 8.1 g/dL 6.2(L) 6.2(L) 6.3(L)  Total Bilirubin 0.3 - 1.2 mg/dL 0.9 0.6 0.6  Alkaline Phos 38 - 126 U/L 77 84 76  AST 15 - 41 U/L 22 24 26   ALT 0 - 44 U/L 17 19 23       Component Value Date/Time   RBC 4.49 06/23/2020 1043   MCV 92.2 06/23/2020 1043   MCH 29.8 06/23/2020 1043   MCHC 32.4 06/23/2020 1043   RDW 14.3 06/23/2020 1043   LYMPHSABS 1.7 06/23/2020 1043   MONOABS 0.6 06/23/2020 1043   EOSABS 0.1 06/23/2020 1043   BASOSABS 0.1 06/23/2020 1043    DIAGNOSTIC IMAGING:  I have independently reviewed the scans and discussed with the patient. CT Chest W Contrast  Result Date: 06/30/2020 CLINICAL DATA:  Restaging follicular lymphoma. Initial diagnosis March 2019. Status post chemotherapy and immunotherapy. EXAM: CT CHEST, ABDOMEN AND PELVIS WITHOUT CONTRAST TECHNIQUE: Multidetector CT imaging of the chest, abdomen and pelvis was performed following the standard protocol without IV contrast. COMPARISON:  CT scan 12/31/2019 FINDINGS: CT CHEST FINDINGS Cardiovascular: The heart is normal in size. No pericardial effusion. Stable tortuosity, ectasia and calcification of the thoracic aorta. 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 No aortic dissection. Scattered coronary artery calcifications are noted. The pulmonary arteries are grossly normal. Mediastinum/Nodes: No supraclavicular, axillary, mediastinal or hilar lymphadenopathy. The  esophagus is grossly normal. Lungs/Pleura: A few scattered sub 4 mm pulmonary nodules are stable. No new pulmonary lesions or worrisome pulmonary findings. The right middle lobe pulmonary nodule described on the prior CT has resolved and was likely an area of atelectasis or inflammation. Musculoskeletal: No significant bony findings. CT ABDOMEN PELVIS FINDINGS Hepatobiliary: No hepatic lesions or intrahepatic biliary dilatation. The gallbladder is normal. No common bile duct dilatation. Pancreas: No mass, inflammation or ductal dilatation. Spleen: Normal size. No focal lesions. Adrenals/Urinary Tract: The adrenal glands are normal. Stable left renal cyst. There are small bilateral parapelvic renal cysts. No worrisome renal lesions or hydronephrosis. The bladder is normal. Stomach/Bowel: The stomach, duodenum, small bowel and colon are unremarkable. No acute inflammatory changes, mass lesions or obstructive findings. The terminal ileum is normal. The appendix is normal. Significant sigmoid colon diverticulosis but no findings for acute diverticulitis. Vascular/Lymphatic: Stable tortuosity, mild ectasia and calcification of the abdominal aorta but no aneurysm or dissection. The branch vessels are patent. No mesenteric or retroperitoneal mass or adenopathy. No pelvic adenopathy or inguinal adenopathy. Reproductive: The uterus and ovaries are normal. Other: No pelvic mass or adenopathy. No free pelvic fluid collections. No inguinal mass or adenopathy. No abdominal wall hernia or subcutaneous lesions. Musculoskeletal: No significant bony findings. IMPRESSION: 1. Resolution of the right middle lobe pulmonary nodule. 2. Stable tortuosity, ectasia and calcification of the thoracic and abdominal aorta and branch vessels. 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 3. No acute abdominal/pelvic findings, mass lesions or adenopathy. No findings suspicious for recurrent lymphoma. Aortic  Atherosclerosis (ICD10-I70.0). Electronically Signed   By: Marijo Sanes M.D.   On: 06/30/2020 08:52   CT Abdomen Pelvis W Contrast  Result Date: 06/30/2020 CLINICAL DATA:  Restaging follicular lymphoma. Initial diagnosis March 2019. Status post chemotherapy and immunotherapy. EXAM: CT CHEST, ABDOMEN AND PELVIS WITHOUT CONTRAST TECHNIQUE: Multidetector CT  imaging of the chest, abdomen and pelvis was performed following the standard protocol without IV contrast. COMPARISON:  CT scan 12/31/2019 FINDINGS: CT CHEST FINDINGS Cardiovascular: The heart is normal in size. No pericardial effusion. Stable tortuosity, ectasia and calcification of the thoracic aorta. 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 No aortic dissection. Scattered coronary artery calcifications are noted. The pulmonary arteries are grossly normal. Mediastinum/Nodes: No supraclavicular, axillary, mediastinal or hilar lymphadenopathy. The esophagus is grossly normal. Lungs/Pleura: A few scattered sub 4 mm pulmonary nodules are stable. No new pulmonary lesions or worrisome pulmonary findings. The right middle lobe pulmonary nodule described on the prior CT has resolved and was likely an area of atelectasis or inflammation. Musculoskeletal: No significant bony findings. CT ABDOMEN PELVIS FINDINGS Hepatobiliary: No hepatic lesions or intrahepatic biliary dilatation. The gallbladder is normal. No common bile duct dilatation. Pancreas: No mass, inflammation or ductal dilatation. Spleen: Normal size. No focal lesions. Adrenals/Urinary Tract: The adrenal glands are normal. Stable left renal cyst. There are small bilateral parapelvic renal cysts. No worrisome renal lesions or hydronephrosis. The bladder is normal. Stomach/Bowel: The stomach, duodenum, small bowel and colon are unremarkable. No acute inflammatory changes, mass lesions or obstructive findings. The terminal ileum is normal. The  appendix is normal. Significant sigmoid colon diverticulosis but no findings for acute diverticulitis. Vascular/Lymphatic: Stable tortuosity, mild ectasia and calcification of the abdominal aorta but no aneurysm or dissection. The branch vessels are patent. No mesenteric or retroperitoneal mass or adenopathy. No pelvic adenopathy or inguinal adenopathy. Reproductive: The uterus and ovaries are normal. Other: No pelvic mass or adenopathy. No free pelvic fluid collections. No inguinal mass or adenopathy. No abdominal wall hernia or subcutaneous lesions. Musculoskeletal: No significant bony findings. IMPRESSION: 1. Resolution of the right middle lobe pulmonary nodule. 2. Stable tortuosity, ectasia and calcification of the thoracic and abdominal aorta and branch vessels. 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 3. No acute abdominal/pelvic findings, mass lesions or adenopathy. No findings suspicious for recurrent lymphoma. Aortic Atherosclerosis (ICD10-I70.0). Electronically Signed   By: Marijo Sanes M.D.   On: 06/30/2020 08:52     ASSESSMENT:  1.  High-grade follicular lymphoma: -Presentation with left iliac lymph node enlargement, no B symptoms, initial node biopsy on 10/19/2016 consistent with non-Hodgkin's lymphoma. -Excisional biopsy on 11/27/2017 shows high-grade follicular lymphoma (grade 3A). -4 cycles of R-CHOP from 12/28/2017 through 02/28/2018.  She had difficulty breathing after second and third treatments. -Maintenance rituximab from 05/01/2018 through 03/05/2020. -CT CAP on 06/29/2020 shows resolution of right middle lobe pulmonary nodule.  No abdominal or pelvic adenopathy.  No findings of lymphoma.  2.  Interstitial lung disease: -Evaluated by pulmonary and was thought to have interstitial lung disease. -Mild dyspnea on exertion is stable. -Recent CT chest on 12/31/2019 showed nonspecific lung nodules measuring up to 4 mm.  New small nodule in the right middle  lobe measuring 3 mm. -CT on 06/29/2020 with resolution of right middle lobe lung nodule.   PLAN:  1.  High-grade follicular lymphoma: -She completed maintenance rituximab.  She does not have any B symptoms. -We reviewed results of the CT scan.  We also reviewed her CBC which showed mild thrombocytopenia of 143.  She had it in the past which bounced back.  LFTs were normal. -I have recommended follow-up in 4 months with labs and physical exam.  If she continues to be stable at that time, we  will switch her to 37-month visits.  2.  Interstitial lung disease: -CAT scan on 06/30/2020 showed resolution of right middle lobe lung nodule.   Orders placed this encounter:  No orders of the defined types were placed in this encounter.    Derek Jack, MD [place] [phone]   I, Milinda Antis, am acting as a scribe for Dr. Sanda Linger.  I, Derek Jack MD, have reviewed the above documentation for accuracy and completeness, and I agree with the above.

## 2020-07-05 NOTE — Patient Instructions (Signed)
[facility] °Discharge Instructions ° °You were seen today by Dr. Katragadda. He went over your recent results. Dr. Katragadda will see you back in 4 months for labs and follow up. ° ° °Thank you for choosing  [facility] at [facility] to provide your oncology and hematology care.  To afford each patient quality time with our provider, please arrive at least 15 minutes before your scheduled appointment time.  ° °If you have a lab appointment with the Cancer Center please come in thru the Main Entrance and check in at the main information desk ° °You need to re-schedule your appointment should you arrive 10 or more minutes late.  We strive to give you quality time with our providers, and arriving late affects you and other patients whose appointments are after yours.  Also, if you no show three or more times for appointments you may be dismissed from the clinic at the providers discretion.     °Again, thank you for choosing [facility].  Our hope is that these requests will decrease the amount of time that you wait before being seen by our physicians.       °_____________________________________________________________ ° °Should you have questions after your visit to [facility], please contact our office at [phone] between the hours of 8:00 a.m. and 4:30 p.m.  Voicemails left after 4:00 p.m. will not be returned until the following business day.  For prescription refill requests, have your pharmacy contact our office and allow 72 hours.   ° °Cancer Center Support Programs:  ° °> Cancer Support Group  °2nd Tuesday of the month 1pm-2pm, Journey Room  ° ° °

## 2020-08-02 DIAGNOSIS — R0602 Shortness of breath: Secondary | ICD-10-CM | POA: Diagnosis not present

## 2020-08-02 DIAGNOSIS — R7301 Impaired fasting glucose: Secondary | ICD-10-CM | POA: Diagnosis not present

## 2020-08-02 DIAGNOSIS — R739 Hyperglycemia, unspecified: Secondary | ICD-10-CM | POA: Diagnosis not present

## 2020-08-02 DIAGNOSIS — M25511 Pain in right shoulder: Secondary | ICD-10-CM | POA: Diagnosis not present

## 2020-08-02 DIAGNOSIS — C8510 Unspecified B-cell lymphoma, unspecified site: Secondary | ICD-10-CM | POA: Diagnosis not present

## 2020-08-02 DIAGNOSIS — Z2821 Immunization not carried out because of patient refusal: Secondary | ICD-10-CM | POA: Diagnosis not present

## 2020-08-02 DIAGNOSIS — Z712 Person consulting for explanation of examination or test findings: Secondary | ICD-10-CM | POA: Diagnosis not present

## 2020-08-02 DIAGNOSIS — I77811 Abdominal aortic ectasia: Secondary | ICD-10-CM | POA: Diagnosis not present

## 2020-08-02 DIAGNOSIS — R599 Enlarged lymph nodes, unspecified: Secondary | ICD-10-CM | POA: Diagnosis not present

## 2020-08-02 DIAGNOSIS — I10 Essential (primary) hypertension: Secondary | ICD-10-CM | POA: Diagnosis not present

## 2020-08-02 DIAGNOSIS — G4700 Insomnia, unspecified: Secondary | ICD-10-CM | POA: Diagnosis not present

## 2020-08-02 DIAGNOSIS — Z6821 Body mass index (BMI) 21.0-21.9, adult: Secondary | ICD-10-CM | POA: Diagnosis not present

## 2020-08-05 DIAGNOSIS — I10 Essential (primary) hypertension: Secondary | ICD-10-CM | POA: Diagnosis not present

## 2020-08-05 DIAGNOSIS — C8238 Follicular lymphoma grade IIIa, lymph nodes of multiple sites: Secondary | ICD-10-CM | POA: Diagnosis not present

## 2020-08-05 DIAGNOSIS — J849 Interstitial pulmonary disease, unspecified: Secondary | ICD-10-CM | POA: Diagnosis not present

## 2020-08-05 DIAGNOSIS — E782 Mixed hyperlipidemia: Secondary | ICD-10-CM | POA: Diagnosis not present

## 2020-08-05 DIAGNOSIS — R7301 Impaired fasting glucose: Secondary | ICD-10-CM | POA: Diagnosis not present

## 2020-08-05 DIAGNOSIS — R42 Dizziness and giddiness: Secondary | ICD-10-CM | POA: Diagnosis not present

## 2020-08-05 DIAGNOSIS — R69 Illness, unspecified: Secondary | ICD-10-CM | POA: Diagnosis not present

## 2020-08-05 DIAGNOSIS — R918 Other nonspecific abnormal finding of lung field: Secondary | ICD-10-CM | POA: Diagnosis not present

## 2020-08-05 DIAGNOSIS — H3530 Unspecified macular degeneration: Secondary | ICD-10-CM | POA: Diagnosis not present

## 2020-08-05 DIAGNOSIS — N200 Calculus of kidney: Secondary | ICD-10-CM | POA: Diagnosis not present

## 2020-11-04 ENCOUNTER — Other Ambulatory Visit (HOSPITAL_COMMUNITY): Payer: Medicare HMO

## 2020-11-04 ENCOUNTER — Encounter (HOSPITAL_COMMUNITY): Payer: Self-pay

## 2020-11-04 ENCOUNTER — Other Ambulatory Visit: Payer: Self-pay

## 2020-11-04 ENCOUNTER — Inpatient Hospital Stay (HOSPITAL_COMMUNITY): Payer: Medicare HMO | Attending: Hematology

## 2020-11-04 DIAGNOSIS — C8238 Follicular lymphoma grade IIIa, lymph nodes of multiple sites: Secondary | ICD-10-CM | POA: Diagnosis not present

## 2020-11-04 LAB — COMPREHENSIVE METABOLIC PANEL
ALT: 18 U/L (ref 0–44)
AST: 21 U/L (ref 15–41)
Albumin: 3.7 g/dL (ref 3.5–5.0)
Alkaline Phosphatase: 66 U/L (ref 38–126)
Anion gap: 8 (ref 5–15)
BUN: 17 mg/dL (ref 8–23)
CO2: 29 mmol/L (ref 22–32)
Calcium: 9 mg/dL (ref 8.9–10.3)
Chloride: 103 mmol/L (ref 98–111)
Creatinine, Ser: 1.06 mg/dL — ABNORMAL HIGH (ref 0.44–1.00)
GFR, Estimated: 53 mL/min — ABNORMAL LOW (ref >60)
Glucose, Bld: 91 mg/dL (ref 70–99)
Potassium: 3.4 mmol/L — ABNORMAL LOW (ref 3.5–5.1)
Sodium: 140 mmol/L (ref 135–145)
Total Bilirubin: 0.5 mg/dL (ref 0.3–1.2)
Total Protein: 6 g/dL — ABNORMAL LOW (ref 6.5–8.1)

## 2020-11-04 LAB — CBC WITH DIFFERENTIAL/PLATELET
Abs Immature Granulocytes: 0.02 10*3/uL (ref 0.00–0.07)
Basophils Absolute: 0.1 10*3/uL (ref 0.0–0.1)
Basophils Relative: 1 %
Eosinophils Absolute: 0.1 10*3/uL (ref 0.0–0.5)
Eosinophils Relative: 2 %
HCT: 43 % (ref 36.0–46.0)
Hemoglobin: 13.8 g/dL (ref 12.0–15.0)
Immature Granulocytes: 0 %
Lymphocytes Relative: 26 %
Lymphs Abs: 1.9 10*3/uL (ref 0.7–4.0)
MCH: 30 pg (ref 26.0–34.0)
MCHC: 32.1 g/dL (ref 30.0–36.0)
MCV: 93.5 fL (ref 80.0–100.0)
Monocytes Absolute: 0.7 10*3/uL (ref 0.1–1.0)
Monocytes Relative: 10 %
Neutro Abs: 4.5 10*3/uL (ref 1.7–7.7)
Neutrophils Relative %: 61 %
Platelets: 177 10*3/uL (ref 150–400)
RBC: 4.6 MIL/uL (ref 3.87–5.11)
RDW: 13.5 % (ref 11.5–15.5)
WBC: 7.4 10*3/uL (ref 4.0–10.5)
nRBC: 0 % (ref 0.0–0.2)

## 2020-11-04 MED ORDER — HEPARIN SOD (PORK) LOCK FLUSH 100 UNIT/ML IV SOLN
500.0000 [IU] | Freq: Once | INTRAVENOUS | Status: AC
  Administered 2020-11-04: 500 [IU] via INTRAVENOUS

## 2020-11-04 MED ORDER — SODIUM CHLORIDE 0.9% FLUSH
20.0000 mL | INTRAVENOUS | Status: DC | PRN
  Administered 2020-11-04: 20 mL via INTRAVENOUS

## 2020-11-04 NOTE — Progress Notes (Signed)
Alicia [name] tolerated peripheral lab draw and portacath flush well without complaints VSS Pt discharged self ambulatory in satisfactory condition

## 2020-11-04 NOTE — Patient Instructions (Signed)
[facility] at [facility] Discharge Instructions  Labs drawn   Thank you for choosing [facility] at [facility] to provide your oncology and hematology care.  To afford each patient quality time with our provider, please arrive at least 15 minutes before your scheduled appointment time.   If you have a lab appointment with the [facility] please come in thru the Main Entrance and check in at the main information desk.  You need to re-schedule your appointment should you arrive 10 or more minutes late.  We strive to give you quality time with our providers, and arriving late affects you and other patients whose appointments are after yours.  Also, if you no show three or more times for appointments you may be dismissed from the clinic at the providers discretion.     Again, thank you for choosing [facility].  Our hope is that these requests will decrease the amount of time that you wait before being seen by our physicians.       _____________________________________________________________  Should you have questions after your visit to [facility], please contact our office at [phone] and follow the prompts.  Our office hours are 8:00 a.m. and 4:30 p.m. Monday - Friday.  Please note that voicemails left after 4:00 p.m. may not be returned until the following business day.  We are closed weekends and major holidays.  You do have access to a nurse 24-7, just call the main number to the clinic [phone] and do not press any options, hold on the line and a nurse will answer the phone.    For prescription refill requests, have your pharmacy contact our office and allow 72 hours.    Due to Covid, you will need to wear a mask upon entering the hospital. If you do not have a mask, a mask will be given to you at the Main Entrance upon arrival. For doctor visits, patients may have 1 support person age 22 or older with them.  For treatment visits, patients can not have anyone with them due to social distancing guidelines and our immunocompromised population.

## 2020-11-11 ENCOUNTER — Inpatient Hospital Stay (HOSPITAL_BASED_OUTPATIENT_CLINIC_OR_DEPARTMENT_OTHER): Payer: Medicare HMO | Admitting: Hematology

## 2020-11-11 ENCOUNTER — Other Ambulatory Visit: Payer: Self-pay

## 2020-11-11 VITALS — BP 126/59 | HR 60 | Temp 98.1°F | Resp 17 | Wt 131.4 lb

## 2020-11-11 DIAGNOSIS — C8238 Follicular lymphoma grade IIIa, lymph nodes of multiple sites: Secondary | ICD-10-CM | POA: Diagnosis not present

## 2020-11-11 NOTE — Progress Notes (Signed)
[place] [address] Alicia [name]   CLINIC:  Medical Oncology/Hematology  PCP:  Celene Squibb, MD [address] [name] [address]  [phone]  REASON FOR VISIT:  Follow-up for high-grade follicular lymphoma  PRIOR THERAPY:  1. R-CHOP x 4 cycles from 12/28/2017 to 02/28/2018. 2. Maintenance rituximab from 05/01/2018 to 03/05/2020.  CURRENT THERAPY: Observation  INTERVAL HISTORY:  Alicia [name], a 80 y.o. female, returns for routine follow-up for her high-grade follicular lymphoma. Alicia [name] was last seen on 07/05/2020.  Today Alicia [name] reports feeling well. Alicia [name] denies recent infections, F/C, night sweats, unexplained weight loss, or ED visits. Her appetite is excellent. Alicia [name] started taking Ziac BID for her BP and will see Dr. Nevada Crane in April.  Alicia [name] is wondering if Alicia [name] can get her port removed since Alicia [name] will unlikely receive any more treatment.  REVIEW OF SYSTEMS:  Review of Systems  Constitutional: Negative for appetite change, chills, diaphoresis, fatigue, fever and unexpected weight change.  All other systems reviewed and are negative.   PAST MEDICAL/SURGICAL HISTORY:  Past Medical History:  Diagnosis Date  . Arthritis    back and hips  . GERD (gastroesophageal reflux disease)    TUMS  . Hypercholesterolemia   . Hypertension   . Non Hodgkin's lymphoma [facility])    Past Surgical History:  Procedure Laterality Date  . LYMPH NODE BIOPSY Left 11/27/2017   Procedure: LEFT INGUINAL LYMPH NODE BIOPSY;  Surgeon: Stark Klein, MD;  Location: [facility];  Service: General;  Laterality: Left;  . PORTACATH PLACEMENT Left 12/12/2017   Procedure: INSERTION PORT-A-CATH;  Surgeon: Stark Klein, MD;  Location: [facility];  Service: General;  Laterality: Left;  . TONSILLECTOMY      SOCIAL HISTORY:  Social History   Socioeconomic History  . Marital status: Widowed    Spouse name: Not on file  . Number of children: Not on file  .  Years of education: Not on file  . Highest education level: Not on file  Occupational History  . Not on file  Tobacco Use  . Smoking status: Never Smoker  . Smokeless tobacco: Never Used  Vaping Use  . Vaping Use: Never used  Substance and Sexual Activity  . Alcohol use: No  . Drug use: No  . Sexual activity: Not on file  Other Topics Concern  . Not on file  Social History Narrative  . Not on file   Social Determinants of Health   Financial Resource Strain: Not on file  Food Insecurity: Not on file  Transportation Needs: Not on file  Physical Activity: Not on file  Stress: Not on file  Social Connections: Not on file  Intimate Partner Violence: Not on file    FAMILY HISTORY:  Family History  Problem Relation Age of Onset  . Lung cancer Mother   . Kidney cancer Father   . Alcoholism Brother   . Emphysema Maternal Aunt   . Cancer Maternal Uncle   . Cancer Maternal Uncle     CURRENT MEDICATIONS:  Current Outpatient Medications  Medication Sig Dispense Refill  . melatonin 5 MG TABS Take 5 mg by mouth at bedtime.    . Omega-3 1000 MG CAPS Take 1 capsule by mouth in the morning.    Marland Kitchen acetaminophen (TYLENOL) 500 MG tablet Take 500 mg by mouth at bedtime as needed for moderate pain (takes 1 tablet at bedtime). 1 1/2 tablet QD    .  bisoprolol-hydrochlorothiazide (ZIAC) 5-6.25 MG tablet Take 1 tablet by mouth 2 (two) times daily.    . diphenhydramine-acetaminophen (TYLENOL PM) 25-500 MG TABS tablet Take 0.5 tablets by mouth at bedtime as needed.    . Multiple Vitamins-Minerals (ICAPS) TABS Take 1 tablet by mouth 2 (two) times daily.    . naproxen sodium (ALEVE) 220 MG tablet Take 220 mg by mouth as needed.    . simvastatin (ZOCOR) 10 MG tablet Take 10 mg by mouth daily.     No current facility-administered medications for this visit.    ALLERGIES:  No Known Allergies  PHYSICAL EXAM:  Performance status (ECOG): 1 - Symptomatic but completely ambulatory  Vitals:    11/11/20 0819  BP: (!) 126/59  Pulse: 60  Resp: 17  Temp: 98.1 F (36.7 C)  SpO2: 99%   Wt Readings from Last 3 Encounters:  11/11/20 131 lb 6 oz (59.6 kg)  07/05/20 129 lb 14.4 oz (58.9 kg)  03/04/20 136 lb 1.6 oz (61.7 kg)   Physical Exam Vitals reviewed.  Constitutional:      Appearance: Normal appearance.  Cardiovascular:     Rate and Rhythm: Normal rate and regular rhythm.     Pulses: Normal pulses.     Heart sounds: Normal heart sounds.  Pulmonary:     Effort: Pulmonary effort is normal.     Breath sounds: Normal breath sounds.  Chest:  Breasts:     Right: No axillary adenopathy or supraclavicular adenopathy.     Left: No axillary adenopathy or supraclavicular adenopathy.    Abdominal:     Palpations: Abdomen is soft. There is no hepatomegaly, splenomegaly or mass.     Tenderness: There is no abdominal tenderness.     Hernia: No hernia is present.  Musculoskeletal:     Right lower leg: No edema.     Left lower leg: No edema.  Lymphadenopathy:     Cervical: No cervical adenopathy.     Upper Body:     Right upper body: No supraclavicular, axillary or pectoral adenopathy.     Left upper body: No supraclavicular, axillary or pectoral adenopathy.     Lower Body: No right inguinal adenopathy. No left inguinal adenopathy.  Neurological:     General: No focal deficit present.     Mental Status: Alicia [name] is alert and oriented to person, place, and time.  Psychiatric:        Mood and Affect: Mood normal.        Behavior: Behavior normal.     LABORATORY DATA:  I have reviewed the labs as listed.  CBC Latest Ref Rng & Units 11/04/2020 06/23/2020 03/04/2020  WBC 4.0 - 10.5 K/uL 7.4 6.0 8.0  Hemoglobin 12.0 - 15.0 g/dL 13.8 13.4 13.4  Hematocrit 36.0 - 46.0 % 43.0 41.4 42.1  Platelets 150 - 400 K/uL 177 143(L) 178   CMP Latest Ref Rng & Units 11/04/2020 06/23/2020 03/04/2020  Glucose 70 - 99 mg/dL 91 83 100(H)  BUN 8 - 23 mg/dL 17 16 18   Creatinine 0.44 - 1.00 mg/dL 1.06(H)  0.78 0.92  Sodium 135 - 145 mmol/L 140 141 141  Potassium 3.5 - 5.1 mmol/L 3.4(L) 3.7 3.8  Chloride 98 - 111 mmol/L 103 105 106  CO2 22 - 32 mmol/L 29 28 27   Calcium 8.9 - 10.3 mg/dL 9.0 9.1 9.2  Total Protein 6.5 - 8.1 g/dL 6.0(L) 6.2(L) 6.2(L)  Total Bilirubin 0.3 - 1.2 mg/dL 0.5 0.9 0.6  Alkaline Phos 38 - 126  U/L 66 77 84  AST 15 - 41 U/L 21 22 24   ALT 0 - 44 U/L 18 17 19       Component Value Date/Time   RBC 4.60 11/04/2020 0906   MCV 93.5 11/04/2020 0906   MCH 30.0 11/04/2020 0906   MCHC 32.1 11/04/2020 0906   RDW 13.5 11/04/2020 0906   LYMPHSABS 1.9 11/04/2020 0906   MONOABS 0.7 11/04/2020 0906   EOSABS 0.1 11/04/2020 0906   BASOSABS 0.1 11/04/2020 0906    DIAGNOSTIC IMAGING:  I have independently reviewed the scans and discussed with the patient. No results found.   ASSESSMENT:  1. High-grade follicular lymphoma: -Presentation with left iliac lymph node enlargement, no B symptoms, initial node biopsy on 10/19/2016 consistent with non-Hodgkin's lymphoma. -Excisional biopsy on 11/27/2017 shows high-grade follicular lymphoma (grade 3A). -4 cycles of R-CHOP from 12/28/2017 through 02/28/2018. Alicia [name] had difficulty breathing after second and third treatments. -Maintenance rituximab from 05/01/2018 through 03/05/2020. -CT CAP on 06/29/2020 shows resolution of right middle lobe pulmonary nodule.  No abdominal or pelvic adenopathy.  No findings of lymphoma.  2. Interstitial lung disease: -Evaluated by pulmonary and was thought to have interstitial lung disease. -Mild dyspnea on exertion is stable. -Recent CT chest on 12/31/2019 showed nonspecific lung nodules measuring up to 4 mm. New small nodule in the right middle lobe measuring 3 mm. -CT on 06/29/2020 with resolution of right middle lobe lung nodule.   PLAN:  1. High-grade follicular lymphoma: -Alicia [name] denies any fevers, night sweats or weight loss in the last 4 months. -Physical examination did not reveal any palpable  adenopathy or splenomegaly. -I have reviewed her labs which showed normal chemistries and CBC. -RTC 4 months with repeat labs and scans and LDH. -Alicia [name] wants to have her port discontinued.  We will discuss it after next CT scans.  2. Interstitial lung disease: -CT scan in September 2021 showed resolution of right middle lobe lung nodule. -Shortness of breath on exertion is stable.  3.  Elevated creatinine: -Her creatinine is elevated at 1.06.  Alicia [name] was taking Ziac twice a day.  We have told her to cut back to once a day.  Alicia [name] was told to monitor her blood pressure.  Orders placed this encounter:  No orders of the defined types were placed in this encounter.    Derek Jack, MD [place] [phone]   I, Milinda Antis, am acting as a scribe for Dr. Sanda Linger.  I, Derek Jack MD, have reviewed the above documentation for accuracy and completeness, and I agree with the above.

## 2020-11-11 NOTE — Patient Instructions (Addendum)
[place] at [facility] Discharge Instructions  You were seen today by Dr. Delton Coombes. He went over your recent results. Take Ziac once daily unless your blood pressure increases. Continue getting your port flushed every 3 months. You will be scheduled for a CT scan of your chest and abdomen before your next visit. Dr. Delton Coombes will see you back in 4 months for labs and follow up.   Thank you for choosing [place] at [facility] to provide your oncology and hematology care.  To afford each patient quality time with our provider, please arrive at least 15 minutes before your scheduled appointment time.   If you have a lab appointment with the [facility] please come in thru the Main Entrance and check in at the main information desk  You need to re-schedule your appointment should you arrive 10 or more minutes late.  We strive to give you quality time with our providers, and arriving late affects you and other patients whose appointments are after yours.  Also, if you no show three or more times for appointments you may be dismissed from the clinic at the providers discretion.     Again, thank you for choosing [facility].  Our hope is that these requests will decrease the amount of time that you wait before being seen by our physicians.       _____________________________________________________________  Should you have questions after your visit to [facility], please contact our office at [phone] between the hours of 8:00 a.m. and 4:30 p.m.  Voicemails left after 4:00 p.m. will not be returned until the following business day.  For prescription refill requests, have your pharmacy contact our office and allow 72 hours.    Cancer Center Support Programs:   > Cancer Support Group  2nd Tuesday of the month 1pm-2pm, Journey Room

## 2020-11-30 ENCOUNTER — Emergency Department (HOSPITAL_COMMUNITY): Payer: Medicare HMO

## 2020-11-30 ENCOUNTER — Emergency Department (HOSPITAL_COMMUNITY)
Admission: EM | Admit: 2020-11-30 | Discharge: 2020-11-30 | Disposition: A | Discharge status: Home / Self Care | Payer: Medicare HMO | Attending: Emergency Medicine | Admitting: Emergency Medicine

## 2020-11-30 ENCOUNTER — Encounter (HOSPITAL_COMMUNITY): Payer: Self-pay | Admitting: *Deleted

## 2020-11-30 ENCOUNTER — Other Ambulatory Visit: Payer: Self-pay

## 2020-11-30 DIAGNOSIS — Z79899 Other long term (current) drug therapy: Secondary | ICD-10-CM | POA: Diagnosis not present

## 2020-11-30 DIAGNOSIS — W01198A Fall on same level from slipping, tripping and stumbling with subsequent striking against other object, initial encounter: Secondary | ICD-10-CM | POA: Insufficient documentation

## 2020-11-30 DIAGNOSIS — S0990XA Unspecified injury of head, initial encounter: Secondary | ICD-10-CM | POA: Diagnosis not present

## 2020-11-30 DIAGNOSIS — S199XXA Unspecified injury of neck, initial encounter: Secondary | ICD-10-CM | POA: Diagnosis not present

## 2020-11-30 DIAGNOSIS — Z20822 Contact with and (suspected) exposure to covid-19: Secondary | ICD-10-CM | POA: Insufficient documentation

## 2020-11-30 DIAGNOSIS — I10 Essential (primary) hypertension: Secondary | ICD-10-CM | POA: Diagnosis not present

## 2020-11-30 DIAGNOSIS — M25511 Pain in right shoulder: Secondary | ICD-10-CM | POA: Insufficient documentation

## 2020-11-30 DIAGNOSIS — E876 Hypokalemia: Secondary | ICD-10-CM | POA: Diagnosis not present

## 2020-11-30 DIAGNOSIS — D696 Thrombocytopenia, unspecified: Secondary | ICD-10-CM | POA: Insufficient documentation

## 2020-11-30 DIAGNOSIS — S0101XA Laceration without foreign body of scalp, initial encounter: Secondary | ICD-10-CM | POA: Diagnosis not present

## 2020-11-30 DIAGNOSIS — R102 Pelvic and perineal pain: Secondary | ICD-10-CM | POA: Diagnosis not present

## 2020-11-30 DIAGNOSIS — R4182 Altered mental status, unspecified: Secondary | ICD-10-CM | POA: Insufficient documentation

## 2020-11-30 DIAGNOSIS — W19XXXA Unspecified fall, initial encounter: Secondary | ICD-10-CM

## 2020-11-30 DIAGNOSIS — Z043 Encounter for examination and observation following other accident: Secondary | ICD-10-CM | POA: Diagnosis not present

## 2020-11-30 LAB — RESP PANEL BY RT-PCR (FLU A&B, COVID) ARPGX2
Influenza A by PCR: NEGATIVE
Influenza B by PCR: NEGATIVE
SARS Coronavirus 2 by RT PCR: NEGATIVE

## 2020-11-30 LAB — COMPREHENSIVE METABOLIC PANEL
ALT: 19 U/L (ref 0–44)
AST: 22 U/L (ref 15–41)
Albumin: 4.1 g/dL (ref 3.5–5.0)
Alkaline Phosphatase: 69 U/L (ref 38–126)
Anion gap: 8 (ref 5–15)
BUN: 20 mg/dL (ref 8–23)
CO2: 26 mmol/L (ref 22–32)
Calcium: 9.8 mg/dL (ref 8.9–10.3)
Chloride: 104 mmol/L (ref 98–111)
Creatinine, Ser: 0.97 mg/dL (ref 0.44–1.00)
GFR, Estimated: 59 mL/min — ABNORMAL LOW (ref >60)
Glucose, Bld: 111 mg/dL — ABNORMAL HIGH (ref 70–99)
Potassium: 3.1 mmol/L — ABNORMAL LOW (ref 3.5–5.1)
Sodium: 138 mmol/L (ref 135–145)
Total Bilirubin: 0.7 mg/dL (ref 0.3–1.2)
Total Protein: 6.5 g/dL (ref 6.5–8.1)

## 2020-11-30 LAB — I-STAT CHEM 8, ED
BUN: 21 mg/dL (ref 8–23)
Calcium, Ion: 1.24 mmol/L (ref 1.15–1.40)
Chloride: 104 mmol/L (ref 98–111)
Creatinine, Ser: 0.9 mg/dL (ref 0.44–1.00)
Glucose, Bld: 106 mg/dL — ABNORMAL HIGH (ref 70–99)
HCT: 43 % (ref 36.0–46.0)
Hemoglobin: 14.6 g/dL (ref 12.0–15.0)
Potassium: 3.3 mmol/L — ABNORMAL LOW (ref 3.5–5.1)
Sodium: 143 mmol/L (ref 135–145)
TCO2: 27 mmol/L (ref 22–32)

## 2020-11-30 LAB — SAMPLE TO BLOOD BANK

## 2020-11-30 LAB — CBC
HCT: 44 % (ref 36.0–46.0)
Hemoglobin: 14.5 g/dL (ref 12.0–15.0)
MCH: 30.2 pg (ref 26.0–34.0)
MCHC: 33 g/dL (ref 30.0–36.0)
MCV: 91.7 fL (ref 80.0–100.0)
Platelets: 181 10*3/uL (ref 150–400)
RBC: 4.8 MIL/uL (ref 3.87–5.11)
RDW: 13.6 % (ref 11.5–15.5)
WBC: 11.4 10*3/uL — ABNORMAL HIGH (ref 4.0–10.5)
nRBC: 0 % (ref 0.0–0.2)

## 2020-11-30 LAB — LACTIC ACID, PLASMA: Lactic Acid, Venous: 1.8 mmol/L (ref 0.5–1.9)

## 2020-11-30 LAB — URINALYSIS, ROUTINE W REFLEX MICROSCOPIC
Bilirubin Urine: NEGATIVE
Glucose, UA: NEGATIVE mg/dL
Hgb urine dipstick: NEGATIVE
Ketones, ur: 5 mg/dL — AB
Leukocytes,Ua: NEGATIVE
Nitrite: NEGATIVE
Protein, ur: NEGATIVE mg/dL
Specific Gravity, Urine: 1.011 (ref 1.005–1.030)
pH: 7 (ref 5.0–8.0)

## 2020-11-30 LAB — PROTIME-INR
INR: 1.1 (ref 0.8–1.2)
Prothrombin Time: 13.3 seconds (ref 11.4–15.2)

## 2020-11-30 LAB — ETHANOL: Alcohol, Ethyl (B): <10 mg/dL (ref <10)

## 2020-11-30 MED ORDER — LIDOCAINE HCL (PF) 2 % IJ SOLN
INTRAMUSCULAR | Status: AC
  Filled 2020-11-30: qty 10

## 2020-11-30 MED ORDER — LIDOCAINE-EPINEPHRINE-TETRACAINE (LET) TOPICAL GEL
3.0000 mL | Freq: Once | TOPICAL | Status: AC
  Administered 2020-11-30: 3 mL via TOPICAL
  Filled 2020-11-30: qty 3

## 2020-11-30 NOTE — ED Triage Notes (Signed)
Per son in law patient fell sometime today and injured head. States she is not her usual self.

## 2020-11-30 NOTE — Discharge Instructions (Addendum)
Keep your wound clean and dry for the next 24 hours.  You may then gently wash your hair, avoid scrubbing the wound itself.  Get rechecked for any new or worsening confusion or worsening headache - your CT images and xrays are reassuring tonight.    Have your sutures removed in 10 days.  Keep your wound clean and dry until a good scab forms - you may then wash your hair gently.  Get rechecked for any sign of infection (redness,  Swelling,  Increased pain or drainage of purulent fluid).  As discussed, your potassium level is low at 3.1 (normal range is 3.5-5.0).  It is recommended you take a potassium supplement for the next 7 days - 20 meq daily.

## 2020-11-30 NOTE — ED Notes (Signed)
ED Provider at bedside. Lidocaine provided per MAR.

## 2020-11-30 NOTE — ED Provider Notes (Signed)
[facility] [place] EMERGENCY DEPARTMENT Provider Note   CSN: [id number] Arrival date & time: 11/30/20  1433     History Chief Complaint  Patient presents with  . Altered Mental Status  . Head Injury    Alicia [name] is a 80 y.o. female with a history of hypertension, hypercholesterolemia, GERD, non-Hodgkin's lymphoma presenting for evaluation of a fall which occurred prior to arrival.  She was trying to help a friend move a wheelchair into her basement when she lost her balance and fell from the top of the steps hitting her head during the fall.  She denies any LOC and reports she was able to drive herself home, but now does not remember actually doing this.  She does endorse having a headache and daughter at the bedside states that she is having difficulty with word retrieval and speaking in full sentences which is very unusual for her mother.  She denies any other pain, denies extremity pain back neck, chest or abdominal pain, also no nausea or vomiting.  She does endorse having a moderate headache.  She denies visual changes.  She took Tylenol prior to arrival for treatment of her headache.   The history is provided by the patient and a relative (dg at bedside contributes to hx).       Past Medical History:  Diagnosis Date  . Arthritis    back and hips  . GERD (gastroesophageal reflux disease)    TUMS  . Hypercholesterolemia   . Hypertension   . Non Hodgkin's lymphoma [facility])     Patient Active Problem List   Diagnosis Date Noted  . Port-A-Cath in place 05/01/2018  . Hyperlipidemia 03/13/2018  . Encounter for antineoplastic chemotherapy 02/08/2018  . Hypophosphatemia 01/24/2018  . Dyspnea 01/24/2018  . HTN (hypertension) 01/24/2018  . Thrombocytopenia ([place]) 01/24/2018  . 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 Counseling regarding advanced care planning and goals of care 12/20/2017    Past Surgical History:  Procedure Laterality  Date  . LYMPH NODE BIOPSY Left 11/27/2017   Procedure: LEFT INGUINAL LYMPH NODE BIOPSY;  Surgeon: Stark Klein, MD;  Location: [place];  Service: General;  Laterality: Left;  . PORTACATH PLACEMENT Left 12/12/2017   Procedure: INSERTION PORT-A-CATH;  Surgeon: Stark Klein, MD;  Location: [place];  Service: General;  Laterality: Left;  . TONSILLECTOMY       OB History   No obstetric history on file.     Family History  Problem Relation Age of Onset  . Lung cancer Mother   . Kidney cancer Father   . Alcoholism Brother   . Emphysema Maternal Aunt   . Cancer Maternal Uncle   . Cancer Maternal Uncle     Social History   Tobacco Use  . Smoking status: Never Smoker  . Smokeless tobacco: Never Used  Vaping Use  . Vaping Use: Never used  Substance Use Topics  . Alcohol use: No  . Drug use: No    Home Medications Prior to Admission medications   Medication Sig Start Date End Date Taking? Authorizing Provider  acetaminophen (TYLENOL) 500 MG tablet Take 500 mg by mouth at bedtime as needed for moderate pain (takes 1 tablet at bedtime). 1 1/2 tablet QD   Yes [provider]  bisoprolol-hydrochlorothiazide (ZIAC) 5-6.25 MG tablet Take 1 tablet by mouth 2 (two) times daily.   Yes [provider]  diphenhydramine-acetaminophen (TYLENOL PM) 25-500 MG TABS tablet Take 0.5  tablets by mouth at bedtime as needed.   Yes [provider]  melatonin 5 MG TABS Take 5 mg by mouth at bedtime.   Yes [provider]  Multiple Vitamins-Minerals (ICAPS) TABS Take 1 tablet by mouth 2 (two) times daily.   Yes [provider]  naproxen sodium (ALEVE) 220 MG tablet Take 220 mg by mouth as needed.   Yes [provider]  Omega-3 1000 MG CAPS Take 1 capsule by mouth in the morning.   Yes [provider]  simvastatin (ZOCOR) 10 MG tablet Take 10 mg by mouth daily.   Yes [provider]    Allergies    Patient has no known  allergies.  Review of Systems   Review of Systems  Constitutional: Negative for fever.  HENT: Negative for trouble swallowing and voice change.   Eyes: Negative.   Respiratory: Negative for chest tightness and shortness of breath.   Cardiovascular: Negative for chest pain.  Gastrointestinal: Negative for abdominal pain, nausea and vomiting.  Genitourinary: Negative.   Musculoskeletal: Negative for arthralgias, joint swelling, neck pain and neck stiffness.  Skin: Positive for wound. Negative for rash.  Neurological: Positive for headaches. Negative for dizziness, seizures, weakness, light-headedness and numbness.  Psychiatric/Behavioral: Positive for confusion.  All other systems reviewed and are negative.   Physical Exam Updated Vital Signs BP (!) 169/89 (BP Location: Left Arm)   Pulse 68   Temp (!) 97.5 F (36.4 C) (Oral)   Resp (!) 22   SpO2 100%   Physical Exam Vitals and nursing note reviewed.  Constitutional:      Appearance: She is well-developed and well-nourished.  HENT:     Head: Normocephalic.     Comments: 2 cm hemostatic laceration right parietal scalp. No palpable skull deformity.    Mouth/Throat:     Pharynx: Oropharynx is clear.  Eyes:     Extraocular Movements: Extraocular movements intact.     Conjunctiva/sclera: Conjunctivae normal.     Pupils: Pupils are equal, round, and reactive to light.  Cardiovascular:     Rate and Rhythm: Normal rate and regular rhythm.     Pulses: Intact distal pulses.     Heart sounds: Normal heart sounds.  Pulmonary:     Effort: Pulmonary effort is normal.     Breath sounds: Normal breath sounds. No wheezing.  Abdominal:     General: Bowel sounds are normal.     Palpations: Abdomen is soft.     Tenderness: There is no abdominal tenderness.  Musculoskeletal:        General: Tenderness present. No swelling or deformity. Normal range of motion.     Cervical back: Normal range of motion. No tenderness.     Comments: ttp  right posterior shoulder, no palpable deformity.  Skin:    General: Skin is warm and dry.  Neurological:     General: No focal deficit present.     Mental Status: She is alert and oriented to person, place, and time.     Cranial Nerves: No cranial nerve deficit.     Sensory: No sensory deficit.     Coordination: Coordination normal.     Comments: Answers questions appropriately but slow.  Difficulty with word finding. No slurred speech.   Psychiatric:        Mood and Affect: Mood and affect and mood normal.     ED Results / Procedures / Treatments   Labs (all labs ordered are listed, but only abnormal results are  displayed) Labs Reviewed  COMPREHENSIVE METABOLIC PANEL - Abnormal; Notable for the following components:      Result Value   Potassium 3.1 (*)    Glucose, Bld 111 (*)    GFR, Estimated 59 (*)    All other components within normal limits  CBC - Abnormal; Notable for the following components:   WBC 11.4 (*)    All other components within normal limits  URINALYSIS, ROUTINE W REFLEX MICROSCOPIC - Abnormal; Notable for the following components:   Ketones, ur 5 (*)    All other components within normal limits  I-STAT CHEM 8, ED - Abnormal; Notable for the following components:   Potassium 3.3 (*)    Glucose, Bld 106 (*)    All other components within normal limits  RESP PANEL BY RT-PCR (FLU A&B, COVID) ARPGX2  ETHANOL  LACTIC ACID, PLASMA  PROTIME-INR  SAMPLE TO BLOOD BANK    EKG EKG Interpretation  Date/Time:  Tuesday November 30 2020 15:02:44 EST Ventricular Rate:  79 PR Interval:    QRS Duration: 92 QT Interval:  395 QTC Calculation: 453 R Axis:   -46 Text Interpretation: Sinus rhythm Left anterior fascicular block Abnormal R-wave progression, early transition LVH with secondary repolarization abnormality Confirmed by Dewaine Conger [phone]) on 12/01/2020 9:51:25 AM   Radiology DG Chest 1 View  Result Date: 11/30/2020 CLINICAL DATA:  Fall. EXAM: CHEST  1  VIEW COMPARISON:  02/14/2018. FINDINGS: PowerPort catheter noted with tip over SVC. Heart size normal. No focal infiltrate. No pleural effusion or pneumothorax. Heart size normal. No acute bony abnormality. Degenerative change thoracic spine and both shoulders. IMPRESSION: 1. PowerPort catheter with tip over SVC. 2. No acute cardiopulmonary disease. Electronically Signed   By: Marcello Moores  Register   On: 11/30/2020 17:11   DG Pelvis 1-2 Views  Result Date: 11/30/2020 CLINICAL DATA:  Pain status post fall. EXAM: PELVIS - 1-2 VIEW COMPARISON:  None. FINDINGS: There is no acute osseous abnormality. Mild degenerative changes are noted of both hips. There are calcifications projecting over the patient's pelvis favored to represent phleboliths. IMPRESSION: No acute osseous abnormality. Mild degenerative changes of both hips. Electronically Signed   By: Constance Holster M.D.   On: 11/30/2020 17:13   DG Shoulder Right  Result Date: 11/30/2020 CLINICAL DATA:  Fall today with right shoulder pain. Limited range of motion. EXAM: RIGHT SHOULDER - 2+ VIEW COMPARISON:  None. FINDINGS: There is no evidence of fracture or dislocation. Moderately advanced glenohumeral degenerative change with inferiorly directed humeral head osteophyte. Mild acromioclavicular degenerative change. Soft tissues are unremarkable. No acute findings in the included right hemithorax. Chest port or central line is partially visualized. IMPRESSION: 1. No acute fracture or dislocation of the right shoulder. 2. Moderately advanced glenohumeral degenerative change. Electronically Signed   By: Keith Rake M.D.   On: 11/30/2020 18:59   CT Head Wo Contrast  Result Date: 11/30/2020 CLINICAL DATA:  Fall down steps, laceration to right side of the head, altered mental status EXAM: CT HEAD WITHOUT CONTRAST CT CERVICAL SPINE WITHOUT CONTRAST TECHNIQUE: Multidetector CT imaging of the head and cervical spine was performed following the standard protocol  without intravenous contrast. Multiplanar CT image reconstructions of the cervical spine were also generated. COMPARISON:  None. FINDINGS: CT HEAD FINDINGS Brain: No evidence of acute infarction, hemorrhage, hydrocephalus, extra-axial collection or mass lesion/mass effect. Mild periventricular and deep white matter hypodensity. Vascular: No hyperdense vessel or unexpected calcification. Skull: Normal. Negative for fracture or focal lesion. Sinuses/Orbits: No acute  finding. Other: Soft tissue laceration and hematoma of the right parietal scalp (series 2, image 24). CT CERVICAL SPINE FINDINGS Alignment: Degenerative straightening and reversal of the normal cervical lordosis. Skull base and vertebrae: No acute fracture. No primary bone lesion or focal pathologic process. Soft tissues and spinal canal: No prevertebral fluid or swelling. No visible canal hematoma. Disc levels: Moderate to severe multilevel disc space height loss and osteophytosis, worst at C5 through C7. Upper chest: Negative. Other: None. IMPRESSION: 1. No acute intracranial pathology. Small-vessel white matter disease. 2. Soft tissue laceration and hematoma of the right parietal scalp. 3. No fracture or static subluxation of the cervical spine. 4. Moderate to severe multilevel disc space height loss and osteophytosis, worst at C5 through C7. Electronically Signed   By: Eddie Candle M.D.   On: 11/30/2020 16:52   CT Cervical Spine Wo Contrast  Result Date: 11/30/2020 CLINICAL DATA:  Fall down steps, laceration to right side of the head, altered mental status EXAM: CT HEAD WITHOUT CONTRAST CT CERVICAL SPINE WITHOUT CONTRAST TECHNIQUE: Multidetector CT imaging of the head and cervical spine was performed following the standard protocol without intravenous contrast. Multiplanar CT image reconstructions of the cervical spine were also generated. COMPARISON:  None. FINDINGS: CT HEAD FINDINGS Brain: No evidence of acute infarction, hemorrhage,  hydrocephalus, extra-axial collection or mass lesion/mass effect. Mild periventricular and deep white matter hypodensity. Vascular: No hyperdense vessel or unexpected calcification. Skull: Normal. Negative for fracture or focal lesion. Sinuses/Orbits: No acute finding. Other: Soft tissue laceration and hematoma of the right parietal scalp (series 2, image 24). CT CERVICAL SPINE FINDINGS Alignment: Degenerative straightening and reversal of the normal cervical lordosis. Skull base and vertebrae: No acute fracture. No primary bone lesion or focal pathologic process. Soft tissues and spinal canal: No prevertebral fluid or swelling. No visible canal hematoma. Disc levels: Moderate to severe multilevel disc space height loss and osteophytosis, worst at C5 through C7. Upper chest: Negative. Other: None. IMPRESSION: 1. No acute intracranial pathology. Small-vessel white matter disease. 2. Soft tissue laceration and hematoma of the right parietal scalp. 3. No fracture or static subluxation of the cervical spine. 4. Moderate to severe multilevel disc space height loss and osteophytosis, worst at C5 through C7. Electronically Signed   By: Eddie Candle M.D.   On: 11/30/2020 16:52    Procedures Procedures   LACERATION REPAIR Performed by: Evalee Jefferson Authorized by: Evalee Jefferson Consent: Verbal consent obtained. Risks and benefits: risks, benefits and alternatives were discussed Consent given by: patient Patient identity confirmed: provided demographic data Prepped and Draped in normal sterile fashion Wound explored  Laceration Location: right parietal scalp  Laceration Length: 2cm  No Foreign Bodies seen or palpated  Anesthesia: local infiltration  Local anesthetic: lidocaine 2% without epinephrine after topical application of LET was not 100% effective  Anesthetic total: 3 ml  Irrigation method: syringe Amount of cleaning: standard  Skin closure:staples  Number of sutures: 6 staples  Technique:   Staples  Patient tolerance: Patient tolerated the procedure well with no immediate complications.   Medications Ordered in ED Medications  lidocaine-EPINEPHrine-tetracaine (LET) topical gel (3 mLs Topical Given 11/30/20 1741)  lidocaine HCl (PF) (XYLOCAINE) 2 % injection (  Given 11/30/20 1909)    ED Course  I have reviewed the triage vital signs and the nursing notes.  Pertinent labs & imaging results that were available during my care of the patient were reviewed by me and considered in my medical decision making (see chart for  details).  Clinical Course as of 12/01/20 1248  Tue Nov 30, 2020  1730 Updated pt with negative imaging results. Pt and dg at bedside both confirm she is normal now with her speech and alertness.  Pt does report new localized pain right posterior shoulder.  Additional imaging ordered.    LET ordered for wound anesthetic, staple closure planned. [JI]  1732 [place] collar removed.  CT c spine negative for acute injury. [JI]    Clinical Course User Index [JI] Landis Martins   MDM Rules/Calculators/A&P                          Labs and imaging reviewed and discussed with pt and dg at bedside.  She was A&O at time of dc.  Given head injury instructions and signs/sx of concussion.  Wound care instructions also discussed.  Discussed hypokalemia and recommendation for potassium supplementation. Pt defers tx of this but states will discuss with pcp.    Wound care instructions given.  Pt advised to have sutures removed in 10* days,  Return here sooner for any signs of infection including redness, swelling, worse pain or drainage of pus.  Pt was seen by Dr. Roderic Palau during this visit.    Final Clinical Impression(s) / ED Diagnoses Final diagnoses:  Injury of head, initial encounter  Laceration of scalp, initial encounter  Hypokalemia    Rx / DC Orders ED Discharge Orders    None       Landis Martins 12/01/20 1251    Milton Ferguson,  MD 12/01/20 1500

## 2020-11-30 NOTE — ED Notes (Signed)
Patient to CT at this time

## 2020-12-10 DIAGNOSIS — R42 Dizziness and giddiness: Secondary | ICD-10-CM | POA: Diagnosis not present

## 2020-12-10 DIAGNOSIS — C8238 Follicular lymphoma grade IIIa, lymph nodes of multiple sites: Secondary | ICD-10-CM | POA: Diagnosis not present

## 2020-12-10 DIAGNOSIS — G3184 Mild cognitive impairment, so stated: Secondary | ICD-10-CM | POA: Diagnosis not present

## 2020-12-10 DIAGNOSIS — H3530 Unspecified macular degeneration: Secondary | ICD-10-CM | POA: Diagnosis not present

## 2020-12-10 DIAGNOSIS — S0101XD Laceration without foreign body of scalp, subsequent encounter: Secondary | ICD-10-CM | POA: Diagnosis not present

## 2021-01-10 DIAGNOSIS — H8110 Benign paroxysmal vertigo, unspecified ear: Secondary | ICD-10-CM | POA: Diagnosis not present

## 2021-01-10 DIAGNOSIS — G3184 Mild cognitive impairment, so stated: Secondary | ICD-10-CM | POA: Diagnosis not present

## 2021-01-19 DIAGNOSIS — I6781 Acute cerebrovascular insufficiency: Secondary | ICD-10-CM | POA: Diagnosis not present

## 2021-01-19 DIAGNOSIS — R9089 Other abnormal findings on diagnostic imaging of central nervous system: Secondary | ICD-10-CM | POA: Diagnosis not present

## 2021-01-19 DIAGNOSIS — I639 Cerebral infarction, unspecified: Secondary | ICD-10-CM | POA: Diagnosis not present

## 2021-01-19 HISTORY — PX: MRI: SHX5353

## 2021-01-27 DIAGNOSIS — Z0100 Encounter for examination of eyes and vision without abnormal findings: Secondary | ICD-10-CM | POA: Diagnosis not present

## 2021-02-02 ENCOUNTER — Other Ambulatory Visit: Payer: Self-pay

## 2021-02-02 ENCOUNTER — Inpatient Hospital Stay (HOSPITAL_COMMUNITY): Payer: Medicare HMO | Attending: Hematology

## 2021-02-02 DIAGNOSIS — Z452 Encounter for adjustment and management of vascular access device: Secondary | ICD-10-CM | POA: Diagnosis not present

## 2021-02-02 DIAGNOSIS — C8238 Follicular lymphoma grade IIIa, lymph nodes of multiple sites: Secondary | ICD-10-CM | POA: Insufficient documentation

## 2021-02-02 MED ORDER — HEPARIN SOD (PORK) LOCK FLUSH 100 UNIT/ML IV SOLN
500.0000 [IU] | Freq: Once | INTRAVENOUS | Status: AC
  Administered 2021-02-02: 500 [IU] via INTRAVENOUS

## 2021-02-02 MED ORDER — SODIUM CHLORIDE 0.9% FLUSH
10.0000 mL | Freq: Once | INTRAVENOUS | Status: AC
  Administered 2021-02-02: 10 mL via INTRAVENOUS

## 2021-02-03 DIAGNOSIS — E538 Deficiency of other specified B group vitamins: Secondary | ICD-10-CM | POA: Diagnosis not present

## 2021-02-03 DIAGNOSIS — R7301 Impaired fasting glucose: Secondary | ICD-10-CM | POA: Diagnosis not present

## 2021-02-03 DIAGNOSIS — I10 Essential (primary) hypertension: Secondary | ICD-10-CM | POA: Diagnosis not present

## 2021-02-03 DIAGNOSIS — E782 Mixed hyperlipidemia: Secondary | ICD-10-CM | POA: Diagnosis not present

## 2021-02-03 DIAGNOSIS — E785 Hyperlipidemia, unspecified: Secondary | ICD-10-CM | POA: Diagnosis not present

## 2021-02-03 DIAGNOSIS — R739 Hyperglycemia, unspecified: Secondary | ICD-10-CM | POA: Diagnosis not present

## 2021-02-08 DIAGNOSIS — R918 Other nonspecific abnormal finding of lung field: Secondary | ICD-10-CM | POA: Diagnosis not present

## 2021-02-08 DIAGNOSIS — H3530 Unspecified macular degeneration: Secondary | ICD-10-CM | POA: Diagnosis not present

## 2021-02-08 DIAGNOSIS — R7301 Impaired fasting glucose: Secondary | ICD-10-CM | POA: Diagnosis not present

## 2021-02-08 DIAGNOSIS — R42 Dizziness and giddiness: Secondary | ICD-10-CM | POA: Diagnosis not present

## 2021-02-08 DIAGNOSIS — J849 Interstitial pulmonary disease, unspecified: Secondary | ICD-10-CM | POA: Diagnosis not present

## 2021-02-08 DIAGNOSIS — C8238 Follicular lymphoma grade IIIa, lymph nodes of multiple sites: Secondary | ICD-10-CM | POA: Diagnosis not present

## 2021-02-08 DIAGNOSIS — N200 Calculus of kidney: Secondary | ICD-10-CM | POA: Diagnosis not present

## 2021-02-08 DIAGNOSIS — R69 Illness, unspecified: Secondary | ICD-10-CM | POA: Diagnosis not present

## 2021-02-08 DIAGNOSIS — I10 Essential (primary) hypertension: Secondary | ICD-10-CM | POA: Diagnosis not present

## 2021-02-08 DIAGNOSIS — E782 Mixed hyperlipidemia: Secondary | ICD-10-CM | POA: Diagnosis not present

## 2021-02-09 ENCOUNTER — Ambulatory Visit: Payer: Medicare HMO | Admitting: Neurology

## 2021-02-15 DIAGNOSIS — H8110 Benign paroxysmal vertigo, unspecified ear: Secondary | ICD-10-CM | POA: Diagnosis not present

## 2021-02-15 DIAGNOSIS — G3184 Mild cognitive impairment, so stated: Secondary | ICD-10-CM | POA: Diagnosis not present

## 2021-03-10 ENCOUNTER — Inpatient Hospital Stay (HOSPITAL_COMMUNITY): Payer: Medicare HMO | Attending: Hematology

## 2021-03-10 ENCOUNTER — Ambulatory Visit (HOSPITAL_COMMUNITY)
Admission: RE | Admit: 2021-03-10 | Discharge: 2021-03-10 | Disposition: A | Discharge status: Home / Self Care | Payer: Medicare HMO | Source: Ambulatory Visit | Attending: Hematology | Admitting: Hematology

## 2021-03-10 ENCOUNTER — Other Ambulatory Visit: Payer: Self-pay

## 2021-03-10 DIAGNOSIS — I2510 Atherosclerotic heart disease of native coronary artery without angina pectoris: Secondary | ICD-10-CM | POA: Diagnosis not present

## 2021-03-10 DIAGNOSIS — N200 Calculus of kidney: Secondary | ICD-10-CM | POA: Diagnosis not present

## 2021-03-10 DIAGNOSIS — I712 Thoracic aortic aneurysm, without rupture: Secondary | ICD-10-CM | POA: Diagnosis not present

## 2021-03-10 DIAGNOSIS — C8238 Follicular lymphoma grade IIIa, lymph nodes of multiple sites: Secondary | ICD-10-CM | POA: Insufficient documentation

## 2021-03-10 DIAGNOSIS — J984 Other disorders of lung: Secondary | ICD-10-CM | POA: Diagnosis not present

## 2021-03-10 DIAGNOSIS — N281 Cyst of kidney, acquired: Secondary | ICD-10-CM | POA: Diagnosis not present

## 2021-03-10 DIAGNOSIS — Z8572 Personal history of non-Hodgkin lymphomas: Secondary | ICD-10-CM | POA: Diagnosis not present

## 2021-03-10 LAB — COMPREHENSIVE METABOLIC PANEL
ALT: 22 U/L (ref 0–44)
AST: 26 U/L (ref 15–41)
Albumin: 4.2 g/dL (ref 3.5–5.0)
Alkaline Phosphatase: 81 U/L (ref 38–126)
Anion gap: 8 (ref 5–15)
BUN: 14 mg/dL (ref 8–23)
CO2: 30 mmol/L (ref 22–32)
Calcium: 9.5 mg/dL (ref 8.9–10.3)
Chloride: 100 mmol/L (ref 98–111)
Creatinine, Ser: 0.81 mg/dL (ref 0.44–1.00)
GFR, Estimated: >60 mL/min (ref >60)
Glucose, Bld: 104 mg/dL — ABNORMAL HIGH (ref 70–99)
Potassium: 4.1 mmol/L (ref 3.5–5.1)
Sodium: 138 mmol/L (ref 135–145)
Total Bilirubin: 1.1 mg/dL (ref 0.3–1.2)
Total Protein: 6.6 g/dL (ref 6.5–8.1)

## 2021-03-10 LAB — CBC WITH DIFFERENTIAL/PLATELET
Abs Immature Granulocytes: 0.02 10*3/uL (ref 0.00–0.07)
Basophils Absolute: 0.1 10*3/uL (ref 0.0–0.1)
Basophils Relative: 1 %
Eosinophils Absolute: 0.2 10*3/uL (ref 0.0–0.5)
Eosinophils Relative: 2 %
HCT: 45.5 % (ref 36.0–46.0)
Hemoglobin: 14.8 g/dL (ref 12.0–15.0)
Immature Granulocytes: 0 %
Lymphocytes Relative: 26 %
Lymphs Abs: 2.2 10*3/uL (ref 0.7–4.0)
MCH: 29.9 pg (ref 26.0–34.0)
MCHC: 32.5 g/dL (ref 30.0–36.0)
MCV: 91.9 fL (ref 80.0–100.0)
Monocytes Absolute: 0.8 10*3/uL (ref 0.1–1.0)
Monocytes Relative: 9 %
Neutro Abs: 5.3 10*3/uL (ref 1.7–7.7)
Neutrophils Relative %: 62 %
Platelets: 166 10*3/uL (ref 150–400)
RBC: 4.95 MIL/uL (ref 3.87–5.11)
RDW: 13.8 % (ref 11.5–15.5)
WBC: 8.6 10*3/uL (ref 4.0–10.5)
nRBC: 0 % (ref 0.0–0.2)

## 2021-03-10 LAB — LACTATE DEHYDROGENASE: LDH: 184 U/L (ref 98–192)

## 2021-03-16 NOTE — Progress Notes (Shared)
[facility] [address] [place] [address]   CLINIC:  Medical Oncology/Hematology  PCP:  Celene Squibb, MD [address] [name] [address] [phone]   REASON FOR VISIT:  Follow-up for high-grade follicular lymphoma  PRIOR THERAPY:  1. R-CHOP x 4 cycles from 12/28/2017 to 02/28/2018. 2. Maintenance rituximab from 05/01/2018 to 03/05/2020.  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 ([place])   12/28/2017 -  Chemotherapy   The patient had DOXOrubicin (ADRIAMYCIN) chemo injection 84 mg, 50 mg/m2 = 84 mg, Intravenous,  Once, 4 of 4 cycles Administration: 84 mg (12/28/2017), 84 mg (01/18/2018), 84 mg (02/08/2018), 84 mg (02/28/2018) palonosetron (ALOXI) injection 0.25 mg, 0.25 mg, Intravenous,  Once, 4 of 4 cycles Administration: 0.25 mg (12/28/2017), 0.25 mg (01/18/2018), 0.25 mg (02/08/2018), 0.25 mg (02/28/2018) pegfilgrastim (NEULASTA) injection 6 mg, 6 mg, Subcutaneous, Once, 3 of 3 cycles Administration: 6 mg (12/31/2017), 6 mg (01/21/2018), 6 mg (02/11/2018) pegfilgrastim (NEULASTA ONPRO KIT) injection 6 mg, 6 mg, Subcutaneous, Once, [name], 2 mg, Intravenous,  Once, 4 of 4 cycles Administration: 2 mg (12/28/2017), 2 mg (01/18/2018), 2 mg (02/08/2018), 2 mg (02/28/2018) riTUXimab (RITUXAN) 600 mg in sodium chloride 0.9 % 250 mL (1.9355 mg/mL) infusion, 375 mg/m2 = 600 mg, Intravenous,  Once, [name], Cycle 6, Reason: Other (see comments)) Administration: 600 mg (12/28/2017) cyclophosphamide (CYTOXAN) 1,[name], 750 mg/m2 = 1,260 mg, Intravenous,  Once, [name],260 mg (12/28/2017), 1,260 mg (01/18/2018), 1,260 mg (02/08/2018), 1,260 mg (02/28/2018) riTUXimab (RITUXAN) 600 mg in sodium chloride 0.9 % 190 mL infusion, 375 mg/m2 = 600 mg, Intravenous,  Once, [name], Cycle 7) Administration: 600 mg (01/18/2018), 600 mg (02/08/2018), 600 mg (02/28/2018), 600 mg (05/01/2018), 600 mg (07/01/2018), 600 mg (08/30/2018), 600 mg (10/30/2018), 600 mg (12/30/2018), 600 mg (03/03/2019), 600 mg (05/05/2019), 600 mg (06/30/2019), 600 mg (09/01/2019), 600 mg (11/03/2019), 600 mg (01/05/2020), 600 mg (03/05/2020) Tbo-Filgrastim (GRANIX) injection 480 mcg, 480 mcg (100 % of original dose 480 mcg), Subcutaneous,  Once, [name], Cycle 4)  for chemotherapy treatment.      CANCER STAGING: Cancer Staging No matching staging information was found for the patient.  INTERVAL HISTORY:  [name], a 80 y.o. female, returns for routine follow-up of her high-grade follicular lymphoma. Alicia [name] was last seen on 11/11/2020.    REVIEW OF SYSTEMS:  Review of Systems  All other systems reviewed and are negative.   PAST MEDICAL/SURGICAL HISTORY:  Past Medical History:  Diagnosis Date  . Arthritis    back and hips  . GERD (gastroesophageal reflux disease)    TUMS  . Hypercholesterolemia   . Hypertension   . Non Hodgkin's lymphoma [facility])    Past Surgical History:  Procedure Laterality Date  . LYMPH NODE BIOPSY Left 11/27/2017   Procedure: LEFT INGUINAL LYMPH NODE BIOPSY;  Surgeon: Stark Klein, MD;  Location: [facility];  Service: General;  Laterality: Left;  .  PORTACATH PLACEMENT Left 12/12/2017   Procedure: INSERTION PORT-A-CATH;  Surgeon: Stark Klein, MD;  Location: [facility];  Service: General;  Laterality: Left;  . TONSILLECTOMY      SOCIAL HISTORY:  Social History   Socioeconomic History  . Marital status: Widowed     Spouse name: Not on file  . Number of children: Not on file  . Years of education: Not on file  . Highest education level: Not on file  Occupational History  . Not on file  Tobacco Use  . Smoking status: Never Smoker  . Smokeless tobacco: Never Used  Vaping Use  . Vaping Use: Never used  Substance and Sexual Activity  . Alcohol use: No  . Drug use: No  . Sexual activity: Not on file  Other Topics Concern  . Not on file  Social History Narrative  . Not on file   Social Determinants of Health   Financial Resource Strain: Not on file  Food Insecurity: Not on file  Transportation Needs: Not on file  Physical Activity: Not on file  Stress: Not on file  Social Connections: Not on file  Intimate Partner Violence: Not on file    FAMILY HISTORY:  Family History  Problem Relation Age of Onset  . Lung cancer Mother   . Kidney cancer Father   . Alcoholism Brother   . Emphysema Maternal Aunt   . Cancer Maternal Uncle   . Cancer Maternal Uncle     CURRENT MEDICATIONS:  Current Outpatient Medications  Medication Sig Dispense Refill  . acetaminophen (TYLENOL) 500 MG tablet Take 500 mg by mouth at bedtime as needed for moderate pain (takes 1 tablet at bedtime). 1 1/2 tablet QD    . bisoprolol-hydrochlorothiazide (ZIAC) 5-6.25 MG tablet Take 1 tablet by mouth 2 (two) times daily.    . diphenhydramine-acetaminophen (TYLENOL PM) 25-500 MG TABS tablet Take 0.5 tablets by mouth at bedtime as needed.    . melatonin 5 MG TABS Take 5 mg by mouth at bedtime.    Marland Kitchen MODERNA COVID-19 VACCINE 100 MCG/0.5ML injection     . Multiple Vitamins-Minerals (ICAPS) TABS Take 1 tablet by mouth 2 (two) times daily.    . naproxen sodium (ALEVE) 220 MG tablet Take 220 mg by mouth as needed.    . Omega-3 1000 MG CAPS Take 1 capsule by mouth in the morning.    . simvastatin (ZOCOR) 10 MG tablet Take 10 mg by mouth daily.     No current facility-administered medications for this visit.    ALLERGIES:   No Known Allergies  PHYSICAL EXAM:  Performance status (ECOG): 1 - Symptomatic but completely ambulatory  There were no vitals filed for this visit. Wt Readings from Last 3 Encounters:  11/11/20 131 lb 6 oz (59.6 kg)  07/05/20 129 lb 14.4 oz (58.9 kg)  03/04/20 136 lb 1.6 oz (61.7 kg)   Physical Exam Vitals reviewed.  Constitutional:      Appearance: Normal appearance.  Cardiovascular:     Rate and Rhythm: Normal rate and regular rhythm.     Pulses: Normal pulses.     Heart sounds: Normal heart sounds.  Pulmonary:     Effort: Pulmonary effort is normal.     Breath sounds: Normal breath sounds.  Neurological:     General: No focal deficit present.     Mental Status: She is alert and oriented to person, place, and time.  Psychiatric:        Mood and Affect:  Mood normal.        Behavior: Behavior normal.      LABORATORY DATA:  I have reviewed the labs as listed.  CBC Latest Ref Rng & Units 03/10/2021 11/30/2020 11/30/2020  WBC 4.0 - 10.5 K/uL 8.6 - 11.4(H)  Hemoglobin 12.0 - 15.0 g/dL 14.8 14.6 14.5  Hematocrit 36.0 - 46.0 % 45.5 43.0 44.0  Platelets 150 - 400 K/uL 166 - 181   CMP Latest Ref Rng & Units 03/10/2021 11/30/2020 11/30/2020  Glucose 70 - 99 mg/dL 104(H) 106(H) 111(H)  BUN 8 - 23 mg/dL $Remove'14 21 20  'cmfpQhl$ Creatinine 0.44 - 1.00 mg/dL 0.81 0.90 0.97  Sodium 135 - 145 mmol/L 138 143 138  Potassium 3.5 - 5.1 mmol/L 4.1 3.3(L) 3.1(L)  Chloride 98 - 111 mmol/L 100 104 104  CO2 22 - 32 mmol/L 30 - 26  Calcium 8.9 - 10.3 mg/dL 9.5 - 9.8  Total Protein 6.5 - 8.1 g/dL 6.6 - 6.5  Total Bilirubin 0.3 - 1.2 mg/dL 1.1 - 0.7  Alkaline Phos 38 - 126 U/L 81 - 69  AST 15 - 41 U/L 26 - 22  ALT 0 - 44 U/L 22 - 19    DIAGNOSTIC IMAGING:  I have independently reviewed the scans and discussed with the patient. CT CHEST ABDOMEN PELVIS WO CONTRAST  Result Date: 03/10/2021 CLINICAL DATA:  History of follicular lymphoma, restaging. EXAM: CT CHEST, ABDOMEN AND PELVIS WITHOUT CONTRAST  TECHNIQUE: Multidetector CT imaging of the chest, abdomen and pelvis was performed following the standard protocol without IV contrast. COMPARISON:  Multiple priors including most recent CT chest abdomen and pelvis June 29, 2020. FINDINGS: CT CHEST FINDINGS Cardiovascular: Left chest wall Port-A-Cath with tip in the SVC. Aortic atherosclerosis. 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 Coronary artery calcifications. Normal size heart. No significant pericardial effusion/thickening. Mediastinum/Nodes: No pathologically enlarged mediastinal, hilar or axillary lymph nodes within the limitations of noncontrast examination. No suspicious thyroid nodularity. The trachea and esophagus are grossly unremarkable. Lungs/Pleura: Again seen are a few scattered sub 4 mm pulmonary nodules which are stable. No new or enlarging suspicious pulmonary nodules or masses. Bibasilar atelectasis versus scarring. Stable mild biapical pleuroparenchymal scarring. No pleural effusion. No pneumothorax. Musculoskeletal: No acute osseous abnormality. CT ABDOMEN PELVIS FINDINGS Hepatobiliary: Grossly unremarkable noncontrast appearance of the hepatic parenchyma. Gallbladder is unremarkable. No biliary ductal dilation. Pancreas: Within normal limits. Spleen: Normal in size without focal abnormality. Adrenals/Urinary Tract: Bilateral adrenal glands are unremarkable. No hydronephrosis. Stable bilateral renal sinus cysts and left renal cyst on image 64/2. Nonobstructive 1-2 mm left interpolar renal stone. Urinary bladder is grossly unremarkable for degree of distension. Stomach/Bowel: Radiopaque enteric contrast traverses the hepatic flexure. Stomach is grossly unremarkable. No pathologic dilation of small bowel. Appendix and terminal ileum within normal limits. No suspicious colonic wall thickening or mass like lesions. Colonic diverticulosis  without findings of acute diverticulitis. Vascular/Lymphatic: Tortuous atherosclerotic abdominal aorta without aneurysmal dilation. No gastrohepatic or hepatoduodenal ligament lymphadenopathy. No retroperitoneal or mesenteric lymphadenopathy. No pelvic sidewall lymphadenopathy. No groin lymphadenopathy. Reproductive: Uterus and bilateral adnexa are unremarkable. Other: No abdominopelvic ascites. Left inguinal surgical clips are stable. Musculoskeletal: Multilevel degenerative changes spine. Mild levoconvex curvature of the lumbar spine. Degenerative changes bilateral hips and SI joints. IMPRESSION: 1. No evidence of lymphadenopathy within the chest, abdomen, or pelvis. 2. 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 Recommend continued surveillance on oncology follow up imaging in this patient. 3. Colonic diverticulosis without findings of acute diverticulitis. 4. Aortic atherosclerosis. Aortic Atherosclerosis (ICD10-I70.0). Electronically Signed   By: Dahlia Bailiff MD   On: 03/10/2021 15:41     ASSESSMENT:  1. High-grade follicular lymphoma: -Presentation with left iliac lymph node enlargement, no B symptoms, initial node biopsy on 10/19/2016 consistent with non-Hodgkin's lymphoma. -Excisional biopsy on 11/27/2017 shows high-grade follicular lymphoma (grade 3A). -4 cycles of R-CHOP from 12/28/2017 through 02/28/2018. She had difficulty breathing after second and third treatments. -Maintenance rituximab from 7/17/2019through 03/05/2020. -CT CAP on 06/29/2020 shows resolution of right middle lobe pulmonary nodule. No abdominal or pelvic adenopathy. No findings of lymphoma.  2. Interstitial lung disease: -Evaluated by pulmonary and was thought to have interstitial lung disease. -Mild dyspnea on exertion is stable. -Recent CT chest on 12/31/2019 showed nonspecific lung nodules measuring up to 4 mm. New small nodule in the right  middle lobe measuring 3 mm. -CT on 06/29/2020 with resolution of right middle lobe lung nodule.   PLAN:  1. High-grade follicular lymphoma: -She denies any fevers, night sweats or weight loss in the last 4 months. -Physical examination did not reveal any palpable adenopathy or splenomegaly. -I have reviewed her labs which showed normal chemistries and CBC. -RTC 4 months with repeat labs and scans and LDH. -She wants to have her port discontinued.  We will discuss it after next CT scans.  2. Interstitial lung disease: -CT scan in September 2021 showed resolution of right middle lobe lung nodule. -Shortness of breath on exertion is stable.  3.  Elevated creatinine: -Her creatinine is elevated at 1.06.  She was taking Ziac twice a day.  We have told her to cut back to once a day.  She was told to monitor her blood pressure.   Orders placed this encounter:  No orders of the defined types were placed in this encounter.    Derek Jack, MD [place] [phone]   I, Thana Ates, am acting as a scribe for Dr. Derek Jack.  {Add Barista Statement}

## 2021-03-17 ENCOUNTER — Inpatient Hospital Stay (HOSPITAL_COMMUNITY): Payer: Medicare HMO | Admitting: Hematology

## 2021-03-17 ENCOUNTER — Other Ambulatory Visit: Payer: Self-pay

## 2021-03-17 ENCOUNTER — Telehealth (HOSPITAL_COMMUNITY): Payer: Medicare HMO | Admitting: Hematology

## 2021-03-17 DIAGNOSIS — C8238 Follicular lymphoma grade IIIa, lymph nodes of multiple sites: Secondary | ICD-10-CM

## 2021-03-30 NOTE — Progress Notes (Signed)
Alicia [name] [address] [place] [address]   CLINIC:  Medical Oncology/Hematology  PCP:  Celene Squibb, MD [address] [name] [address] [phone]   REASON FOR VISIT:  Follow-up for high-grade follicular lymphoma  PRIOR THERAPY:  1. R-CHOP x 4 cycles from 12/28/2017 to 02/28/2018. 2. Maintenance rituximab from 05/01/2018 to 03/05/2020.  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 ([place])    12/28/2017 -  Chemotherapy   The patient had DOXOrubicin (ADRIAMYCIN) chemo injection 84 mg, 50 mg/m2 = 84 mg, Intravenous,  Once, 4 of 4 cycles Administration: 84 mg (12/28/2017), 84 mg (01/18/2018), 84 mg (02/08/2018), 84 mg (02/28/2018) palonosetron (ALOXI) injection 0.25 mg, 0.25 mg, Intravenous,  Once, 4 of 4 cycles Administration: 0.25 mg (12/28/2017), 0.25 mg (01/18/2018), 0.25 mg (02/08/2018), 0.25 mg (02/28/2018) pegfilgrastim (NEULASTA) injection 6 mg, 6 mg, Subcutaneous, Once, 3 of 3 cycles Administration: 6 mg (12/31/2017), 6 mg (01/21/2018), 6 mg (02/11/2018) pegfilgrastim (NEULASTA ONPRO KIT) injection 6 mg, 6 mg, Subcutaneous, Once, [name], 2 mg, Intravenous,  Once, 4 of 4 cycles Administration: 2 mg (12/28/2017), 2 mg (01/18/2018), 2 mg (02/08/2018), 2 mg (02/28/2018) riTUXimab (RITUXAN) 600 mg in sodium chloride 0.9 % 250 mL (1.9355 mg/mL) infusion, 375 mg/m2 = 600 mg, Intravenous,  Once, [name], Cycle 6, Reason: Other (see comments)) Administration: 600 mg (12/28/2017) cyclophosphamide (CYTOXAN) 1,[name], 750 mg/m2 = 1,260 mg, Intravenous,  Once, [name],260 mg (12/28/2017), 1,260 mg (01/18/2018), 1,260 mg (02/08/2018), 1,260 mg (02/28/2018) riTUXimab (RITUXAN) 600 mg in sodium chloride 0.9 % 190 mL infusion, 375 mg/m2 = 600 mg, Intravenous,  Once, [name], Cycle 7) Administration: 600 mg (01/18/2018), 600 mg (02/08/2018), 600 mg (02/28/2018), 600 mg (05/01/2018), 600 mg (07/01/2018), 600 mg (08/30/2018), 600 mg (10/30/2018), 600 mg (12/30/2018), 600 mg (03/03/2019), 600 mg (05/05/2019), 600 mg (06/30/2019), 600 mg (09/01/2019), 600 mg (11/03/2019), 600 mg (01/05/2020), 600 mg (03/05/2020) Tbo-Filgrastim (GRANIX) injection 480 mcg, 480 mcg (100 % of original dose 480 mcg), Subcutaneous,  Once, [name], Cycle 4)   for chemotherapy treatment.       CANCER STAGING: Cancer Staging No matching staging information was found for the patient.  INTERVAL HISTORY:  Alicia [name], a 80 y.o. female, returns for routine follow-up of her high-grade follicular lymphoma. Alicia [name] was last seen on 11/11/2020.   Today she reports feeling good. In February she fell down a flight of stairs and suffered a head injury. She reports tingling and numbness on the bottom of her feet, but this is at baseline and has been present since initially receiving chemo. She denies any skin rash.   REVIEW OF SYSTEMS:  Review of Systems  Respiratory:  Positive for shortness of breath.   Skin:  Negative for rash.  Neurological:  Positive for numbness.  Psychiatric/Behavioral:  Positive for sleep disturbance.   All other systems reviewed and are negative.  PAST MEDICAL/SURGICAL HISTORY:  Past Medical History:  Diagnosis Date   Arthritis    back and hips   GERD (gastroesophageal reflux disease)    TUMS   Hypercholesterolemia    Hypertension    Non Hodgkin's lymphoma ([name])    Past Surgical History:  Procedure Laterality Date   LYMPH NODE  BIOPSY Left 11/27/2017   Procedure: LEFT INGUINAL LYMPH NODE BIOPSY;  Surgeon: Stark Klein, MD;  Location: [facility];  Service: General;  Laterality: Left;   PORTACATH PLACEMENT Left 12/12/2017   Procedure: INSERTION PORT-A-CATH;  Surgeon: Stark Klein, MD;  Location: [facility];  Service: General;  Laterality: Left;   TONSILLECTOMY      SOCIAL HISTORY:  Social History   Socioeconomic History   Marital status: Widowed    Spouse name: Not on file   Number of children: Not on file   Years of education: Not on file   Highest education level: Not on file  Occupational History   Not on file  Tobacco Use   Smoking status: Never   Smokeless tobacco: Never  Vaping Use   Vaping Use: Never used  Substance and Sexual Activity   Alcohol use: No   Drug use: No   Sexual activity: Not on file  Other Topics Concern   Not on file  Social History Narrative   Not on file   Social Determinants of Health   Financial Resource Strain: Not on file  Food Insecurity: Not on file  Transportation Needs: Not on file  Physical Activity: Not on file  Stress: Not on file  Social Connections: Not on file  Intimate Partner Violence: Not on file    FAMILY HISTORY:  Family History  Problem Relation Age of Onset   Lung cancer Mother    Kidney cancer Father    Alcoholism Brother    Emphysema Maternal Aunt    Cancer Maternal Uncle    Cancer Maternal Uncle     CURRENT MEDICATIONS:  Current Outpatient Medications  Medication Sig Dispense Refill   acetaminophen (TYLENOL) 500 MG tablet Take 500 mg by mouth at bedtime as needed for moderate pain (takes 1 tablet at bedtime). 1 1/2 tablet QD     bisoprolol-hydrochlorothiazide (ZIAC) 5-6.25 MG tablet Take 1 tablet by mouth 2 (two) times daily.     diphenhydramine-acetaminophen (TYLENOL PM) 25-500 MG TABS tablet Take 0.5 tablets by mouth at bedtime as needed.     melatonin 5 MG TABS Take 5 mg by mouth at bedtime.     MODERNA COVID-19 VACCINE  100 MCG/0.5ML injection      Multiple Vitamins-Minerals (ICAPS) TABS Take 1 tablet by mouth 2 (two) times daily.     naproxen sodium (ALEVE) 220 MG tablet Take 220 mg by mouth as needed.     Omega-3 1000 MG CAPS Take 1 capsule by mouth in the morning.     simvastatin (ZOCOR) 20 MG tablet Take 20 mg by mouth daily.     No current facility-administered medications for this visit.    ALLERGIES:  No Known Allergies  PHYSICAL EXAM:  Performance status (ECOG): 1 - Symptomatic but completely ambulatory  There were no vitals filed for this visit. Wt Readings from Last 3 Encounters:  11/11/20 131 lb 6 oz (59.6 kg)  07/05/20 129 lb 14.4 oz (58.9 kg)  03/04/20 136 lb 1.6 oz (61.7 kg)   Physical Exam Vitals reviewed.  Constitutional:      Appearance: Normal appearance.  Cardiovascular:     Rate and Rhythm: Normal  rate and regular rhythm.     Pulses: Normal pulses.     Heart sounds: Normal heart sounds.  Pulmonary:     Effort: Pulmonary effort is normal.     Breath sounds: Normal breath sounds.  Musculoskeletal:     Right lower leg: No edema.     Left lower leg: No edema.  Neurological:     General: No focal deficit present.     Mental Status: She is alert and oriented to person, place, and time.  Psychiatric:        Mood and Affect: Mood normal.        Behavior: Behavior normal.     LABORATORY DATA:  I have reviewed the labs as listed.  CBC Latest Ref Rng & Units 03/10/2021 11/30/2020 11/30/2020  WBC 4.0 - 10.5 K/uL 8.6 - 11.4(H)  Hemoglobin 12.0 - 15.0 g/dL 14.8 14.6 14.5  Hematocrit 36.0 - 46.0 % 45.5 43.0 44.0  Platelets 150 - 400 K/uL 166 - 181   CMP Latest Ref Rng & Units 03/10/2021 11/30/2020 11/30/2020  Glucose 70 - 99 mg/dL 104(H) 106(H) 111(H)  BUN 8 - 23 mg/dL $Remove'14 21 20  'vpfqmRi$ Creatinine 0.44 - 1.00 mg/dL 0.81 0.90 0.97  Sodium 135 - 145 mmol/L 138 143 138  Potassium 3.5 - 5.1 mmol/L 4.1 3.3(L) 3.1(L)  Chloride 98 - 111 mmol/L 100 104 104  CO2 22 - 32 mmol/L 30 - 26   Calcium 8.9 - 10.3 mg/dL 9.5 - 9.8  Total Protein 6.5 - 8.1 g/dL 6.6 - 6.5  Total Bilirubin 0.3 - 1.2 mg/dL 1.1 - 0.7  Alkaline Phos 38 - 126 U/L 81 - 69  AST 15 - 41 U/L 26 - 22  ALT 0 - 44 U/L 22 - 19    DIAGNOSTIC IMAGING:  I have independently reviewed the scans and discussed with the patient. CT CHEST ABDOMEN PELVIS WO CONTRAST  Result Date: 03/10/2021 CLINICAL DATA:  History of follicular lymphoma, restaging. EXAM: CT CHEST, ABDOMEN AND PELVIS WITHOUT CONTRAST TECHNIQUE: Multidetector CT imaging of the chest, abdomen and pelvis was performed following the standard protocol without IV contrast. COMPARISON:  Multiple priors including most recent CT chest abdomen and pelvis June 29, 2020. FINDINGS: CT CHEST FINDINGS Cardiovascular: Left chest wall Port-A-Cath with tip in the SVC. Aortic atherosclerosis. 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 Coronary artery calcifications. Normal size heart. No significant pericardial effusion/thickening. Mediastinum/Nodes: No pathologically enlarged mediastinal, hilar or axillary lymph nodes within the limitations of noncontrast examination. No suspicious thyroid nodularity. The trachea and esophagus are grossly unremarkable. Lungs/Pleura: Again seen are a few scattered sub 4 mm pulmonary nodules which are stable. No new or enlarging suspicious pulmonary nodules or masses. Bibasilar atelectasis versus scarring. Stable mild biapical pleuroparenchymal scarring. No pleural effusion. No pneumothorax. Musculoskeletal: No acute osseous abnormality. CT ABDOMEN PELVIS FINDINGS Hepatobiliary: Grossly unremarkable noncontrast appearance of the hepatic parenchyma. Gallbladder is unremarkable. No biliary ductal dilation. Pancreas: Within normal limits. Spleen: Normal in size without focal abnormality. Adrenals/Urinary Tract: Bilateral adrenal glands are unremarkable. No  hydronephrosis. Stable bilateral renal sinus cysts and left renal cyst on image 64/2. Nonobstructive 1-2 mm left interpolar renal stone. Urinary bladder is grossly unremarkable for degree of distension. Stomach/Bowel: Radiopaque enteric contrast traverses the hepatic flexure. Stomach is grossly unremarkable. No pathologic dilation of small bowel. Appendix and terminal ileum within normal limits. No suspicious colonic wall thickening or mass  like lesions. Colonic diverticulosis without findings of acute diverticulitis. Vascular/Lymphatic: Tortuous atherosclerotic abdominal aorta without aneurysmal dilation. No gastrohepatic or hepatoduodenal ligament lymphadenopathy. No retroperitoneal or mesenteric lymphadenopathy. No pelvic sidewall lymphadenopathy. No groin lymphadenopathy. Reproductive: Uterus and bilateral adnexa are unremarkable. Other: No abdominopelvic ascites. Left inguinal surgical clips are stable. Musculoskeletal: Multilevel degenerative changes spine. Mild levoconvex curvature of the lumbar spine. Degenerative changes bilateral hips and SI joints. IMPRESSION: 1. No evidence of lymphadenopathy within the chest, abdomen, or pelvis. 2. 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 Recommend continued surveillance on oncology follow up imaging in this patient. 3. Colonic diverticulosis without findings of acute diverticulitis. 4. Aortic atherosclerosis. Aortic Atherosclerosis (ICD10-I70.0). Electronically Signed   By: Dahlia Bailiff MD   On: 03/10/2021 15:41     ASSESSMENT:  1.  High-grade follicular lymphoma: -Presentation with left iliac lymph node enlargement, no B symptoms, initial node biopsy on 10/19/2016 consistent with non-Hodgkin's lymphoma. -Excisional biopsy on 11/27/2017 shows high-grade follicular lymphoma (grade 3A). -4 cycles of R-CHOP from 12/28/2017 through 02/28/2018.  She had difficulty breathing after second and  third treatments. -Maintenance rituximab from 05/01/2018 through 03/05/2020. -CT CAP on 06/29/2020 shows resolution of right middle lobe pulmonary nodule.  No abdominal or pelvic adenopathy.  No findings of lymphoma.   2.  Interstitial lung disease: -Evaluated by pulmonary and was thought to have interstitial lung disease. -Mild dyspnea on exertion is stable. -Recent CT chest on 12/31/2019 showed nonspecific lung nodules measuring up to 4 mm.  New small nodule in the right middle lobe measuring 3 mm. -CT on 06/29/2020 with resolution of right middle lobe lung nodule.   PLAN:  1.  High-grade follicular lymphoma: - She denies any fevers, night sweats or weight loss. - We reviewed CT CAP from 03/10/2021 which showed no evidence of adenopathy in the chest, abdomen or pelvis.  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 - Reviewed labs from 03/10/2021 which showed normal LDH and CBC.  LFTs are normal. - She will call Dr. Marlowe Aschoff office and have her port discontinued. - RTC 6 months with repeat labs and physical exam.  Repeat scan in 1 year.   2.  Interstitial lung disease: - CT scan in September 2021 showed resolution of right middle lobe lung nodule. - Current CT scan showed few scattered sub-4 mm pulmonary nodules which are stable.   3.  Elevated creatinine: - Creatinine has returned to baseline at 0.81.   Orders placed this encounter:  No orders of the defined types were placed in this encounter.    Derek Jack, MD [place] [phone]   I, Thana Ates, am acting as a scribe for Dr. Derek Jack.  I, Derek Jack MD, have reviewed the above documentation for accuracy and completeness, and I agree with the above.

## 2021-03-31 ENCOUNTER — Inpatient Hospital Stay (HOSPITAL_COMMUNITY): Payer: Medicare HMO | Attending: Hematology | Admitting: Hematology

## 2021-03-31 ENCOUNTER — Other Ambulatory Visit: Payer: Self-pay

## 2021-03-31 ENCOUNTER — Other Ambulatory Visit: Payer: Self-pay | Admitting: General Surgery

## 2021-03-31 VITALS — BP 132/67 | HR 63 | Temp 96.9°F | Resp 18 | Wt 131.1 lb

## 2021-03-31 DIAGNOSIS — C8238 Follicular lymphoma grade IIIa, lymph nodes of multiple sites: Secondary | ICD-10-CM

## 2021-03-31 NOTE — Patient Instructions (Addendum)
[facility] at [facility] Discharge Instructions  You were seen today by Dr. Katragadda. He went over your recent results and scans. Dr. Katragadda will see you back in 6 months for labs and follow up.   Thank you for choosing [facility] at [facility] to provide your oncology and hematology care.  To afford each patient quality time with our provider, please arrive at least 15 minutes before your scheduled appointment time.   If you have a lab appointment with the Cancer Center please come in thru the Main Entrance and check in at the main information desk  You need to re-schedule your appointment should you arrive 10 or more minutes late.  We strive to give you quality time with our providers, and arriving late affects you and other patients whose appointments are after yours.  Also, if you no show three or more times for appointments you may be dismissed from the clinic at the providers discretion.     Again, thank you for choosing [facility].  Our hope is that these requests will decrease the amount of time that you wait before being seen by our physicians.       _____________________________________________________________  Should you have questions after your visit to [facility], please contact our office at [phone] between the hours of 8:00 a.m. and 4:30 p.m.  Voicemails left after 4:00 p.m. will not be returned until the following business day.  For prescription refill requests, have your pharmacy contact our office and allow 72 hours.    Cancer Center Support Programs:   > Cancer Support Group  2nd Tuesday of the month 1pm-2pm, Journey Room   

## 2021-04-13 ENCOUNTER — Encounter (HOSPITAL_COMMUNITY): Payer: Self-pay | Admitting: General Surgery

## 2021-04-13 NOTE — Progress Notes (Signed)
DUE TO COVID-19 ONLY ONE VISITOR IS ALLOWED TO COME WITH YOU AND STAY IN THE WAITING ROOM ONLY DURING PRE OP AND PROCEDURE DAY OF SURGERY.   PCP - Dr Allyn Kenner Cardiologist - Dr Quay Burow (saw once) Oncology - Dr Derek Jack  Chest x-ray - 11/30/20 (1V) EKG - 11/30/20 Stress Test - 03/15/18 ECHO - 01/24/18 Cardiac Cath - n/a  Sleep Study -  n/a CPAP - none  Anesthesia review: Yes  STOP now taking any Aspirin (unless otherwise instructed by your surgeon), Aleve, Naproxen, Ibuprofen, Motrin, Advil, Goody's, BC's, all herbal medications, fish oil, and all vitamins.   Coronavirus Screening Covid test n/a - Ambulatory Surgery   Do you have any of the following symptoms:  Cough yes/no: No Fever (>100.43F)  yes/no: No Runny nose yes/no: No Sore throat yes/no: No Difficulty breathing/shortness of breath  yes/no: No  Have you traveled in the last 14 days and where? yes/no: No  Patient verbalized understanding of instructions that were given via phone

## 2021-04-14 ENCOUNTER — Encounter (HOSPITAL_COMMUNITY): Payer: Self-pay | Admitting: General Surgery

## 2021-04-14 ENCOUNTER — Ambulatory Visit (HOSPITAL_COMMUNITY): Payer: Medicare HMO | Admitting: Emergency Medicine

## 2021-04-14 ENCOUNTER — Encounter (HOSPITAL_COMMUNITY)
Admission: RE | Disposition: A | Discharge status: Home / Self Care | Payer: Self-pay | Source: Home / Self Care | Attending: General Surgery

## 2021-04-14 ENCOUNTER — Ambulatory Visit (HOSPITAL_COMMUNITY)
Admission: RE | Admit: 2021-04-14 | Discharge: 2021-04-14 | Disposition: A | Discharge status: Home / Self Care | Payer: Medicare HMO | Attending: General Surgery | Admitting: General Surgery

## 2021-04-14 DIAGNOSIS — Z79899 Other long term (current) drug therapy: Secondary | ICD-10-CM | POA: Insufficient documentation

## 2021-04-14 DIAGNOSIS — Z452 Encounter for adjustment and management of vascular access device: Secondary | ICD-10-CM | POA: Diagnosis not present

## 2021-04-14 DIAGNOSIS — C8238 Follicular lymphoma grade IIIa, lymph nodes of multiple sites: Secondary | ICD-10-CM | POA: Diagnosis not present

## 2021-04-14 DIAGNOSIS — D696 Thrombocytopenia, unspecified: Secondary | ICD-10-CM | POA: Diagnosis not present

## 2021-04-14 DIAGNOSIS — I10 Essential (primary) hypertension: Secondary | ICD-10-CM | POA: Diagnosis not present

## 2021-04-14 DIAGNOSIS — Z8572 Personal history of non-Hodgkin lymphomas: Secondary | ICD-10-CM | POA: Diagnosis not present

## 2021-04-14 HISTORY — DX: Dyspnea, unspecified: R06.00

## 2021-04-14 HISTORY — PX: PORT-A-CATH REMOVAL: SHX5289

## 2021-04-14 HISTORY — DX: Unspecified dementia, unspecified severity, without behavioral disturbance, psychotic disturbance, mood disturbance, and anxiety: F03.90

## 2021-04-14 LAB — POCT I-STAT, CHEM 8
BUN: 14 mg/dL (ref 8–23)
Calcium, Ion: 1.23 mmol/L (ref 1.15–1.40)
Chloride: 99 mmol/L (ref 98–111)
Creatinine, Ser: 0.8 mg/dL (ref 0.44–1.00)
Glucose, Bld: 115 mg/dL — ABNORMAL HIGH (ref 70–99)
HCT: 44 % (ref 36.0–46.0)
Hemoglobin: 15 g/dL (ref 12.0–15.0)
Potassium: 4.1 mmol/L (ref 3.5–5.1)
Sodium: 139 mmol/L (ref 135–145)
TCO2: 27 mmol/L (ref 22–32)

## 2021-04-14 LAB — BASIC METABOLIC PANEL
Anion gap: 8 (ref 5–15)
BUN: 20 mg/dL (ref 8–23)
CO2: 26 mmol/L (ref 22–32)
Calcium: 9.2 mg/dL (ref 8.9–10.3)
Chloride: 107 mmol/L (ref 98–111)
Creatinine, Ser: 0.88 mg/dL (ref 0.44–1.00)
GFR, Estimated: >60 mL/min (ref >60)
Glucose, Bld: 97 mg/dL (ref 70–99)
Potassium: 5.5 mmol/L — ABNORMAL HIGH (ref 3.5–5.1)
Sodium: 141 mmol/L (ref 135–145)

## 2021-04-14 SURGERY — REMOVAL PORT-A-CATH
Anesthesia: Monitor Anesthesia Care | Site: Chest | Laterality: Left

## 2021-04-14 MED ORDER — DEXAMETHASONE SODIUM PHOSPHATE 10 MG/ML IJ SOLN
INTRAMUSCULAR | Status: AC
  Filled 2021-04-14: qty 1

## 2021-04-14 MED ORDER — CHLORHEXIDINE GLUCONATE CLOTH 2 % EX PADS: 6.0000 | MEDICATED_PAD | Freq: Once | CUTANEOUS | Status: DC

## 2021-04-14 MED ORDER — CHLORHEXIDINE GLUCONATE 0.12 % MT SOLN
OROMUCOSAL | Status: AC
  Filled 2021-04-14: qty 15

## 2021-04-14 MED ORDER — FENTANYL CITRATE (PF) 250 MCG/5ML IJ SOLN
INTRAMUSCULAR | Status: AC
  Filled 2021-04-14: qty 5

## 2021-04-14 MED ORDER — ONDANSETRON HCL 4 MG/2ML IJ SOLN: 4.0000 mg | Freq: Once | INTRAMUSCULAR | Status: DC | PRN

## 2021-04-14 MED ORDER — OXYCODONE HCL 5 MG PO TABS: 5.0000 mg | ORAL_TABLET | Freq: Once | ORAL | Status: DC | PRN

## 2021-04-14 MED ORDER — PROPOFOL 10 MG/ML IV BOLUS
INTRAVENOUS | Status: AC
  Filled 2021-04-14: qty 20

## 2021-04-14 MED ORDER — 0.9 % SODIUM CHLORIDE (POUR BTL) OPTIME
TOPICAL | Status: DC | PRN
  Administered 2021-04-14: 1000 mL

## 2021-04-14 MED ORDER — ACETAMINOPHEN 500 MG PO TABS
ORAL_TABLET | ORAL | Status: AC
  Administered 2021-04-14: 1000 mg via ORAL
  Filled 2021-04-14: qty 2

## 2021-04-14 MED ORDER — ONDANSETRON HCL 4 MG/2ML IJ SOLN
INTRAMUSCULAR | Status: DC | PRN
  Administered 2021-04-14: 4 mg via INTRAVENOUS

## 2021-04-14 MED ORDER — FENTANYL CITRATE (PF) 250 MCG/5ML IJ SOLN
INTRAMUSCULAR | Status: DC | PRN
  Administered 2021-04-14: 25 ug via INTRAVENOUS

## 2021-04-14 MED ORDER — PROPOFOL 500 MG/50ML IV EMUL
INTRAVENOUS | Status: DC | PRN
  Administered 2021-04-14: 75 ug/kg/min via INTRAVENOUS

## 2021-04-14 MED ORDER — LACTATED RINGERS IV SOLN: INTRAVENOUS | Status: DC

## 2021-04-14 MED ORDER — OXYCODONE HCL 5 MG PO TABS: 2.5000 mg | ORAL_TABLET | Freq: Four times a day (QID) | ORAL | 0 refills | Status: DC | PRN

## 2021-04-14 MED ORDER — LIDOCAINE 2% (20 MG/ML) 5 ML SYRINGE
INTRAMUSCULAR | Status: AC
  Filled 2021-04-14: qty 5

## 2021-04-14 MED ORDER — LIDOCAINE HCL (PF) 1 % IJ SOLN
INTRAMUSCULAR | Status: AC
  Filled 2021-04-14: qty 30

## 2021-04-14 MED ORDER — PROPOFOL 10 MG/ML IV BOLUS
INTRAVENOUS | Status: DC | PRN
  Administered 2021-04-14 (×2): 10 mg via INTRAVENOUS

## 2021-04-14 MED ORDER — LIDOCAINE HCL 1 % IJ SOLN
INTRAMUSCULAR | Status: DC | PRN
  Administered 2021-04-14: 10 mL

## 2021-04-14 MED ORDER — ACETAMINOPHEN 500 MG PO TABS: 1000.0000 mg | ORAL_TABLET | ORAL | Status: AC

## 2021-04-14 MED ORDER — OXYCODONE HCL 5 MG/5ML PO SOLN: 5.0000 mg | Freq: Once | ORAL | Status: DC | PRN

## 2021-04-14 MED ORDER — LIDOCAINE 2% (20 MG/ML) 5 ML SYRINGE
INTRAMUSCULAR | Status: DC | PRN
  Administered 2021-04-14: 40 mg via INTRAVENOUS

## 2021-04-14 MED ORDER — ONDANSETRON HCL 4 MG/2ML IJ SOLN
INTRAMUSCULAR | Status: AC
  Filled 2021-04-14: qty 2

## 2021-04-14 MED ORDER — CEFAZOLIN SODIUM-DEXTROSE 2-4 GM/100ML-% IV SOLN
2.0000 g | INTRAVENOUS | Status: AC
  Administered 2021-04-14: 2 g via INTRAVENOUS

## 2021-04-14 MED ORDER — FENTANYL CITRATE (PF) 100 MCG/2ML IJ SOLN: 25.0000 ug | INTRAMUSCULAR | Status: DC | PRN

## 2021-04-14 MED ORDER — CEFAZOLIN SODIUM-DEXTROSE 2-4 GM/100ML-% IV SOLN
INTRAVENOUS | Status: AC
  Filled 2021-04-14: qty 100

## 2021-04-14 SURGICAL SUPPLY — 31 items
ADH SKN CLS APL DERMABOND .7 (GAUZE/BANDAGES/DRESSINGS) ×1
BAG COUNTER SPONGE SURGICOUNT (BAG) ×2 IMPLANT
BAG SPNG CNTER NS LX DISP (BAG) ×1
CHLORAPREP W/TINT 10.5 ML (MISCELLANEOUS) ×2 IMPLANT
COVER SURGICAL LIGHT HANDLE (MISCELLANEOUS) ×2 IMPLANT
DECANTER SPIKE VIAL GLASS SM (MISCELLANEOUS) ×4 IMPLANT
DERMABOND ADVANCED (GAUZE/BANDAGES/DRESSINGS) ×1
DERMABOND ADVANCED .7 DNX12 (GAUZE/BANDAGES/DRESSINGS) ×1 IMPLANT
DRAPE LAPAROTOMY 100X72 PEDS (DRAPES) ×2 IMPLANT
ELECT CAUTERY BLADE 6.4 (BLADE) ×2 IMPLANT
ELECT REM PT RETURN 9FT ADLT (ELECTROSURGICAL) ×2
ELECTRODE REM PT RTRN 9FT ADLT (ELECTROSURGICAL) ×1 IMPLANT
GAUZE 4X4 16PLY ~~LOC~~+RFID DBL (SPONGE) ×2 IMPLANT
GLOVE SURG ENC MOIS LTX SZ6 (GLOVE) ×2 IMPLANT
GLOVE SURG UNDER LTX SZ6.5 (GLOVE) ×2 IMPLANT
GOWN STRL REUS W/ TWL LRG LVL3 (GOWN DISPOSABLE) ×1 IMPLANT
GOWN STRL REUS W/TWL 2XL LVL3 (GOWN DISPOSABLE) ×2 IMPLANT
GOWN STRL REUS W/TWL LRG LVL3 (GOWN DISPOSABLE) ×2
KIT BASIN OR (CUSTOM PROCEDURE TRAY) ×2 IMPLANT
KIT TURNOVER KIT B (KITS) ×2 IMPLANT
NDL HYPO 25GX1X1/2 BEV (NEEDLE) ×1 IMPLANT
NEEDLE HYPO 25GX1X1/2 BEV (NEEDLE) ×2 IMPLANT
NS IRRIG 1000ML POUR BTL (IV SOLUTION) ×2 IMPLANT
PACK GENERAL/GYN (CUSTOM PROCEDURE TRAY) ×2 IMPLANT
PAD ARMBOARD 7.5X6 YLW CONV (MISCELLANEOUS) ×4 IMPLANT
SUT MON AB 4-0 PC3 18 (SUTURE) ×2 IMPLANT
SUT VIC AB 3-0 SH 27 (SUTURE) ×2
SUT VIC AB 3-0 SH 27X BRD (SUTURE) ×1 IMPLANT
SYR CONTROL 10ML LL (SYRINGE) ×2 IMPLANT
TOWEL GREEN STERILE (TOWEL DISPOSABLE) ×2 IMPLANT
TOWEL GREEN STERILE FF (TOWEL DISPOSABLE) ×2 IMPLANT

## 2021-04-14 NOTE — Transfer of Care (Signed)
Immediate Anesthesia Transfer of Care Note  Patient: Alicia [name]  Procedure(s) Performed: PORT REMOVAL (Left: Chest)  Patient Location: PACU  Anesthesia Type:MAC  Level of Consciousness: awake and alert   Airway & Oxygen Therapy: Patient Spontanous Breathing  Post-op Assessment: Report given to RN and Post -op Vital signs reviewed and stable  Post vital signs: Reviewed and stable  Last Vitals:  Vitals Value Taken Time  BP 124/72 04/14/21 1028  Temp    Pulse 47 04/14/21 1029  Resp 18 04/14/21 1029  SpO2 99 % 04/14/21 1029  Vitals shown include unvalidated device data.  Last Pain:  Vitals:   04/14/21 0656  TempSrc:   PainSc: 0-No pain      Patients Stated Pain Goal: 0 (53/74/82 [id number])  Complications: No notable events documented.

## 2021-04-14 NOTE — Op Note (Signed)
  PRE-OPERATIVE DIAGNOSIS:  un-needed Port-A-Cath for lymphoma  POST-OPERATIVE DIAGNOSIS:  Same   PROCEDURE:  Procedure(s):  REMOVAL PORT-A-CATH  SURGEON:  Surgeon(s):  Stark Klein, MD  ANESTHESIA:   MAC + local  EBL:   Minimal  SPECIMEN:  None  Complications : none known  Procedure:   Pt was  identified in the holding area and taken to the operating room where she was placed supine on the operating room table.  MAC anesthesia was induced.  The left upper chest was prepped and draped.  The prior incision was anesthetized with local anesthetic.  The incision was opened with a #15 blade.  The subcutaneous tissue was divided with the cautery.  The port was identified and the capsule opened.  The four 2-0 prolene sutures were removed.  The port was then removed and pressure held on the tract.  The catheter appeared intact without evidence of breakage, catheter tip was at 36 cm.  The wound was inspected for hemostasis, which was achieved with cautery.  The wound was closed with 3-0 vicryl deep dermal interrupted sutures and 4-0 Monocryl running subcuticular suture.  The wound was cleaned, dried, and dressed with dermabond.  The patient was awakened from anesthesia and taken to the PACU in stable condition.  Needle, sponge, and instrument counts are correct.

## 2021-04-14 NOTE — H&P (Signed)
Alicia [name] is an 80 y.o. female.   Chief Complaint: port in place HPI:  Pt is an 80 yo F who is s/p port placement in 2019 for a dx of lymphoma.  She completed chemotherapy and has no evidence of disease.  She desires port removal.    Past Medical History:  Diagnosis Date   Arthritis    back and hips   Dementia ([place])    mild per patient   Dyspnea    with exertion   GERD (gastroesophageal reflux disease)    TUMS   Hypercholesterolemia    Hypertension    Non Hodgkin's lymphoma ([place])     Past Surgical History:  Procedure Laterality Date   LYMPH NODE BIOPSY Left 11/27/2017   Procedure: LEFT INGUINAL LYMPH NODE BIOPSY;  Surgeon: Stark Klein, MD;  Location: [facility];  Service: General;  Laterality: Left;   MRI  01/19/2021   with anesthesia at [facility] Left 12/12/2017   Procedure: INSERTION PORT-A-CATH;  Surgeon: Stark Klein, MD;  Location: [facility];  Service: General;  Laterality: Left;   TONSILLECTOMY      Family History  Problem Relation Age of Onset   Lung cancer Mother    Kidney cancer Father    Alcoholism Brother    Emphysema Maternal Aunt    Cancer Maternal Uncle    Cancer Maternal Uncle    Social History:  reports that she has never smoked. She has never used smokeless tobacco. She reports that she does not drink alcohol and does not use drugs.  Allergies: No Known Allergies  Medications Prior to Admission  Medication Sig Dispense Refill   acetaminophen (TYLENOL) 500 MG tablet Take 500 mg by mouth at bedtime.     bisoprolol-hydrochlorothiazide (ZIAC) 5-6.25 MG tablet Take 1 tablet by mouth daily.     diphenhydramine-acetaminophen (TYLENOL PM) 25-500 MG TABS tablet Take 0.5 tablets by mouth at bedtime.     melatonin 5 MG TABS Take 5 mg by mouth at bedtime.     Multiple Vitamins-Minerals (PRESERVISION AREDS) CAPS Take 1 capsule by mouth daily.     naproxen sodium (ALEVE) 220 MG tablet Take 220 mg by mouth daily as  needed (Pain).     simvastatin (ZOCOR) 20 MG tablet Take 20 mg by mouth at bedtime.     MODERNA COVID-19 VACCINE 100 MCG/0.5ML injection  (Patient not taking: No sig reported)      Results for orders placed or performed during the hospital encounter of 04/14/21 (from the past 48 hour(s))  Basic metabolic panel     Status: Abnormal   Collection Time: 04/14/21  7:04 AM  Result Value Ref Range   Sodium 141 135 - 145 mmol/L   Potassium 5.5 (H) 3.5 - 5.1 mmol/L    Comment: SPECIMEN HEMOLYZED. HEMOLYSIS MAY AFFECT INTEGRITY OF RESULTS.   Chloride 107 98 - 111 mmol/L   CO2 26 22 - 32 mmol/L   Glucose, Bld 97 70 - 99 mg/dL    Comment: Glucose reference range applies only to samples taken after fasting for at least 8 hours.   BUN 20 8 - 23 mg/dL   Creatinine, Ser 0.88 0.44 - 1.00 mg/dL   Calcium 9.2 8.9 - 10.3 mg/dL   GFR, Estimated >60 >60 mL/min    Comment: (NOTE) Calculated using the CKD-EPI Creatinine Equation (2021)    Anion gap 8 5 - 15    Comment: Performed at [facility]  [address], [place], [place] [address]   No results found.  Review of Systems  All other systems reviewed and are negative.  Blood pressure (!) 148/75, pulse 65, temperature 97.9 F (36.6 C), temperature source Oral, resp. rate 16, height 5\' 6"  (1.676 m), weight 59.4 kg, SpO2 99 %. Physical Exam Vitals reviewed.  Constitutional:      General: She is not in acute distress.    Appearance: Normal appearance. She is normal weight. She is not ill-appearing.  HENT:     Head: Normocephalic and atraumatic.     Right Ear: External ear normal.     Left Ear: External ear normal.     Nose: Nose normal.  Eyes:     General: No scleral icterus.    Extraocular Movements: Extraocular movements intact.     Conjunctiva/sclera: Conjunctivae normal.     Pupils: Pupils are equal, round, and reactive to light.  Cardiovascular:     Rate and Rhythm: Normal rate.  Pulmonary:     Effort: Pulmonary effort is normal.      Comments: Port in place on left chest. Musculoskeletal:        General: Normal range of motion.  Skin:    General: Skin is warm and dry.     Capillary Refill: Capillary refill takes 2 to 3 seconds.     Coloration: Skin is pale (normal skin tone for her). Skin is not jaundiced.     Findings: No bruising, erythema, lesion or rash.  Neurological:     General: No focal deficit present.     Mental Status: She is alert and oriented to person, place, and time.  Psychiatric:        Mood and Affect: Mood normal.        Behavior: Behavior normal.        Thought Content: Thought content normal.        Judgment: Judgment normal.     Assessment/Plan Port in place  Plan port removal. Discussed procedure, recovery, and risks.  She wants to proceed.    Stark Klein, MD 04/14/2021, 7:57 AM

## 2021-04-14 NOTE — Anesthesia Preprocedure Evaluation (Signed)
Anesthesia Evaluation  Patient identified by MRN, date of birth, ID band Patient awake    Reviewed: Allergy & Precautions, NPO status , Patient's Chart, lab work & pertinent test results  Airway Mallampati: II  TM Distance: >3 FB Neck ROM: Full    Dental  (+) Teeth Intact   Pulmonary    breath sounds clear to auscultation       Cardiovascular hypertension,  Rhythm:Regular Rate:Normal     Neuro/Psych    GI/Hepatic   Endo/Other    Renal/GU      Musculoskeletal   Abdominal   Peds  Hematology   Anesthesia Other Findings   Reproductive/Obstetrics                             Anesthesia Physical Anesthesia Plan  ASA: 3  Anesthesia Plan: MAC   Post-op Pain Management:    Induction: Intravenous  PONV Risk Score and Plan: Ondansetron and Dexamethasone  Airway Management Planned: Natural Airway and Simple Face Mask  Additional Equipment:   Intra-op Plan:   Post-operative Plan:   Informed Consent: I have reviewed the patients History and Physical, chart, labs and discussed the procedure including the risks, benefits and alternatives for the proposed anesthesia with the patient or authorized representative who has indicated his/her understanding and acceptance.       Plan Discussed with: CRNA and Anesthesiologist  Anesthesia Plan Comments:         Anesthesia Quick Evaluation

## 2021-04-14 NOTE — Anesthesia Procedure Notes (Addendum)
Procedure Name: MAC Date/Time: 04/14/2021 9:52 AM Performed by: Harden Mo, CRNA Pre-anesthesia Checklist: Patient identified, Emergency Drugs available, Suction available and Patient being monitored Patient Re-evaluated:Patient Re-evaluated prior to induction Oxygen Delivery Method: Simple face mask Induction Type: IV induction Placement Confirmation: positive ETCO2 and breath sounds checked- equal and bilateral

## 2021-04-14 NOTE — Discharge Instructions (Addendum)
[facility] Office Phone Number [phone]   POST OP INSTRUCTIONS  Always review your discharge instruction sheet given to you by the facility where your surgery was performed.  IF YOU HAVE DISABILITY OR FAMILY LEAVE FORMS, YOU MUST BRING THEM TO THE OFFICE FOR PROCESSING.  DO NOT GIVE THEM TO YOUR DOCTOR.  A prescription for pain medication may be given to you upon discharge.  Take your pain medication as prescribed, if needed.  If narcotic pain medicine is not needed, then you may take acetaminophen (Tylenol) or ibuprofen (Advil) as needed. Take your usually prescribed medications unless otherwise directed If you need a refill on your pain medication, please contact your pharmacy.  They will contact our office to request authorization.  Prescriptions will not be filled after 5pm or on week-ends. You should eat very light the first 24 hours after surgery, such as soup, crackers, pudding, etc.  Resume your normal diet the day after surgery It is common to experience some constipation if taking pain medication after surgery.  Increasing fluid intake and taking a stool softener will usually help or prevent this problem from occurring.  A mild laxative (Milk of Magnesia or Miralax) should be taken according to package directions if there are no bowel movements after 48 hours. You may shower in 48 hours.  The surgical glue will flake off in 2-3 weeks.   ACTIVITIES:  No strenuous activity or heavy lifting for 1 week.   You may drive when you no longer are taking prescription pain medication, you can comfortably wear a seatbelt, and you can safely maneuver your car and apply brakes. RETURN TO WORK:  __________n/a_______________ You should see your doctor in the office for a follow-up appointment approximately three-four weeks after your surgery.    WHEN TO CALL YOUR DOCTOR: Fever over 101.0 Nausea and/or vomiting. Extreme swelling or bruising. Continued bleeding from  incision. Increased pain, redness, or drainage from the incision.  The clinic staff is available to answer your questions during regular business hours.  Please don't hesitate to call and ask to speak to one of the nurses for clinical concerns.  If you have a medical emergency, go to the nearest emergency room or call 911.  A surgeon from [facility] is always on call at the hospital.  For further questions, please visit [email or web address]   

## 2021-04-15 ENCOUNTER — Encounter (HOSPITAL_COMMUNITY): Payer: Self-pay | Admitting: General Surgery

## 2021-04-16 NOTE — Anesthesia Postprocedure Evaluation (Signed)
Anesthesia Post Note  Patient: Alicia [name]  Procedure(s) Performed: PORT REMOVAL (Left: Chest)     Patient location during evaluation: PACU Anesthesia Type: MAC Level of consciousness: awake and alert Pain management: pain level controlled Vital Signs Assessment: post-procedure vital signs reviewed and stable Respiratory status: spontaneous breathing, nonlabored ventilation, respiratory function stable and patient connected to nasal cannula oxygen Cardiovascular status: stable and blood pressure returned to baseline Postop Assessment: no apparent nausea or vomiting Anesthetic complications: no   No notable events documented.  Last Vitals:  Vitals:   04/14/21 1045 04/14/21 1100  BP: (!) 145/74 (!) 166/74  Pulse: (!) 48 (!) 54  Resp: 19 16  Temp:  36.6 C  SpO2: 99% 100%    Last Pain:  Vitals:   04/14/21 1100  TempSrc:   PainSc: 0-No pain                 Mikel Hardgrove COKER

## 2021-07-07 DIAGNOSIS — I10 Essential (primary) hypertension: Secondary | ICD-10-CM | POA: Diagnosis not present

## 2021-07-07 DIAGNOSIS — R739 Hyperglycemia, unspecified: Secondary | ICD-10-CM | POA: Diagnosis not present

## 2021-07-11 DIAGNOSIS — G3184 Mild cognitive impairment, so stated: Secondary | ICD-10-CM | POA: Diagnosis not present

## 2021-07-11 DIAGNOSIS — R42 Dizziness and giddiness: Secondary | ICD-10-CM | POA: Diagnosis not present

## 2021-07-11 DIAGNOSIS — I10 Essential (primary) hypertension: Secondary | ICD-10-CM | POA: Diagnosis not present

## 2021-07-11 DIAGNOSIS — E782 Mixed hyperlipidemia: Secondary | ICD-10-CM | POA: Diagnosis not present

## 2021-07-11 DIAGNOSIS — G4700 Insomnia, unspecified: Secondary | ICD-10-CM | POA: Diagnosis not present

## 2021-07-11 DIAGNOSIS — R911 Solitary pulmonary nodule: Secondary | ICD-10-CM | POA: Diagnosis not present

## 2021-07-11 DIAGNOSIS — H3530 Unspecified macular degeneration: Secondary | ICD-10-CM | POA: Diagnosis not present

## 2021-07-11 DIAGNOSIS — C8590 Non-Hodgkin lymphoma, unspecified, unspecified site: Secondary | ICD-10-CM | POA: Diagnosis not present

## 2021-07-11 DIAGNOSIS — R7301 Impaired fasting glucose: Secondary | ICD-10-CM | POA: Diagnosis not present

## 2021-07-11 DIAGNOSIS — J849 Interstitial pulmonary disease, unspecified: Secondary | ICD-10-CM | POA: Diagnosis not present

## 2021-08-01 DIAGNOSIS — H40011 Open angle with borderline findings, low risk, right eye: Secondary | ICD-10-CM | POA: Diagnosis not present

## 2021-08-01 DIAGNOSIS — H524 Presbyopia: Secondary | ICD-10-CM | POA: Diagnosis not present

## 2021-08-25 DIAGNOSIS — H26492 Other secondary cataract, left eye: Secondary | ICD-10-CM | POA: Diagnosis not present

## 2021-08-25 DIAGNOSIS — H26493 Other secondary cataract, bilateral: Secondary | ICD-10-CM | POA: Diagnosis not present

## 2021-08-25 DIAGNOSIS — H40013 Open angle with borderline findings, low risk, bilateral: Secondary | ICD-10-CM | POA: Diagnosis not present

## 2021-08-25 DIAGNOSIS — H353132 Nonexudative age-related macular degeneration, bilateral, intermediate dry stage: Secondary | ICD-10-CM | POA: Diagnosis not present

## 2021-08-25 DIAGNOSIS — Z961 Presence of intraocular lens: Secondary | ICD-10-CM | POA: Diagnosis not present

## 2021-09-22 DIAGNOSIS — H26491 Other secondary cataract, right eye: Secondary | ICD-10-CM | POA: Diagnosis not present

## 2021-10-19 ENCOUNTER — Inpatient Hospital Stay (HOSPITAL_COMMUNITY): Payer: Medicare HMO | Attending: Hematology

## 2021-10-19 ENCOUNTER — Other Ambulatory Visit: Payer: Self-pay

## 2021-10-19 DIAGNOSIS — R944 Abnormal results of kidney function studies: Secondary | ICD-10-CM | POA: Insufficient documentation

## 2021-10-19 DIAGNOSIS — C8238 Follicular lymphoma grade IIIa, lymph nodes of multiple sites: Secondary | ICD-10-CM

## 2021-10-19 DIAGNOSIS — C8285 Other types of follicular lymphoma, lymph nodes of inguinal region and lower limb: Secondary | ICD-10-CM | POA: Insufficient documentation

## 2021-10-19 DIAGNOSIS — J849 Interstitial pulmonary disease, unspecified: Secondary | ICD-10-CM | POA: Diagnosis not present

## 2021-10-19 LAB — COMPREHENSIVE METABOLIC PANEL
ALT: 19 U/L (ref 0–44)
AST: 21 U/L (ref 15–41)
Albumin: 3.7 g/dL (ref 3.5–5.0)
Alkaline Phosphatase: 64 U/L (ref 38–126)
Anion gap: 7 (ref 5–15)
BUN: 19 mg/dL (ref 8–23)
CO2: 28 mmol/L (ref 22–32)
Calcium: 8.9 mg/dL (ref 8.9–10.3)
Chloride: 106 mmol/L (ref 98–111)
Creatinine, Ser: 1.23 mg/dL — ABNORMAL HIGH (ref 0.44–1.00)
GFR, Estimated: 44 mL/min — ABNORMAL LOW (ref >60)
Glucose, Bld: 129 mg/dL — ABNORMAL HIGH (ref 70–99)
Potassium: 3.6 mmol/L (ref 3.5–5.1)
Sodium: 141 mmol/L (ref 135–145)
Total Bilirubin: 0.7 mg/dL (ref 0.3–1.2)
Total Protein: 6.1 g/dL — ABNORMAL LOW (ref 6.5–8.1)

## 2021-10-19 LAB — CBC WITH DIFFERENTIAL/PLATELET
Abs Immature Granulocytes: 0.03 10*3/uL (ref 0.00–0.07)
Basophils Absolute: 0.1 10*3/uL (ref 0.0–0.1)
Basophils Relative: 1 %
Eosinophils Absolute: 0.1 10*3/uL (ref 0.0–0.5)
Eosinophils Relative: 1 %
HCT: 41 % (ref 36.0–46.0)
Hemoglobin: 13.4 g/dL (ref 12.0–15.0)
Immature Granulocytes: 0 %
Lymphocytes Relative: 29 %
Lymphs Abs: 2.4 10*3/uL (ref 0.7–4.0)
MCH: 29.8 pg (ref 26.0–34.0)
MCHC: 32.7 g/dL (ref 30.0–36.0)
MCV: 91.1 fL (ref 80.0–100.0)
Monocytes Absolute: 0.6 10*3/uL (ref 0.1–1.0)
Monocytes Relative: 8 %
Neutro Abs: 5.1 10*3/uL (ref 1.7–7.7)
Neutrophils Relative %: 61 %
Platelets: 169 10*3/uL (ref 150–400)
RBC: 4.5 MIL/uL (ref 3.87–5.11)
RDW: 13.7 % (ref 11.5–15.5)
WBC: 8.3 10*3/uL (ref 4.0–10.5)
nRBC: 0 % (ref 0.0–0.2)

## 2021-10-19 LAB — LACTATE DEHYDROGENASE: LDH: 143 U/L (ref 98–192)

## 2021-10-26 ENCOUNTER — Inpatient Hospital Stay (HOSPITAL_BASED_OUTPATIENT_CLINIC_OR_DEPARTMENT_OTHER): Payer: Medicare HMO | Admitting: Hematology

## 2021-10-26 ENCOUNTER — Other Ambulatory Visit: Payer: Self-pay

## 2021-10-26 VITALS — BP 141/77 | HR 55 | Temp 98.3°F | Resp 18 | Ht 63.78 in | Wt 134.9 lb

## 2021-10-26 DIAGNOSIS — R944 Abnormal results of kidney function studies: Secondary | ICD-10-CM | POA: Diagnosis not present

## 2021-10-26 DIAGNOSIS — C8238 Follicular lymphoma grade IIIa, lymph nodes of multiple sites: Secondary | ICD-10-CM | POA: Diagnosis not present

## 2021-10-26 DIAGNOSIS — J849 Interstitial pulmonary disease, unspecified: Secondary | ICD-10-CM | POA: Diagnosis not present

## 2021-10-26 DIAGNOSIS — C8285 Other types of follicular lymphoma, lymph nodes of inguinal region and lower limb: Secondary | ICD-10-CM | POA: Diagnosis not present

## 2021-10-26 NOTE — Patient Instructions (Signed)
[place] at [facility] Discharge Instructions   You were seen and examined today by Dr. Delton Coombes.  He reviewed your lab work with you, which was normal/stable.  Your kidney number (creatinine) went up.  This could be due to the fact that your Ziac has been increased to 2 tablets daily.  We will repeat a CT scan in 6 months.  Return as scheduled for lab work and office visit.  Call the clinic should you have any questions or concerns prior to your next visit.    Thank you for choosing [place] at [facility] to provide your oncology and hematology care.  To afford each patient quality time with our provider, please arrive at least 15 minutes before your scheduled appointment time.   If you have a lab appointment with the [facility] please come in thru the Main Entrance and check in at the main information desk.  You need to re-schedule your appointment should you arrive 10 or more minutes late.  We strive to give you quality time with our providers, and arriving late affects you and other patients whose appointments are after yours.  Also, if you no show three or more times for appointments you may be dismissed from the clinic at the providers discretion.     Again, thank you for choosing [facility] [facility].  Our hope is that these requests will decrease the amount of time that you wait before being seen by our physicians.       _____________________________________________________________  Should you have questions after your visit to [facility], please contact our office at [phone] and follow the prompts.  Our office hours are 8:00 a.m. and 4:30 p.m. Monday - Friday.  Please note that voicemails left after 4:00 p.m. may not be returned until the following business day.  We are closed weekends and major holidays.  You do have access to a nurse 24-7, just call the main number to the clinic [phone] and do not  press any options, hold on the line and a nurse will answer the phone.    For prescription refill requests, have your pharmacy contact our office and allow 72 hours.    Due to Covid, you will need to wear a mask upon entering the hospital. If you do not have a mask, a mask will be given to you at the Main Entrance upon arrival. For doctor visits, patients may have 1 support person age 57 or older with them. For treatment visits, patients can not have anyone with them due to social distancing guidelines and our immunocompromised population.

## 2021-10-26 NOTE — Progress Notes (Signed)
[facility] [address], [place] [address]   CLINIC:  Medical Oncology/Hematology  PCP:  Benita Stabile, MD [address] [place] [place] [address] [phone]   REASON FOR VISIT:  Follow-up for high-grade follicular lymphoma  PRIOR THERAPY:  1. R-CHOP x 4 cycles from 12/28/2017 to 02/28/2018. 2. Maintenance rituximab from 05/01/2018 to 03/05/2020.  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)   12/28/2017 -  Chemotherapy   The patient had DOXOrubicin (ADRIAMYCIN) chemo injection 84 mg, 50 mg/m2 = 84 mg, Intravenous,  Once, 4 of 4 cycles Administration: 84 mg (12/28/2017), 84 mg (01/18/2018), 84 mg (02/08/2018), 84 mg (02/28/2018) palonosetron (ALOXI) injection 0.25 mg, 0.25 mg, Intravenous,  Once, 4 of 4 cycles Administration: 0.25 mg (12/28/2017), 0.25 mg (01/18/2018), 0.25 mg (02/08/2018), 0.25 mg (02/28/2018) pegfilgrastim (NEULASTA) injection 6 mg, 6 mg, Subcutaneous, Once, 3 of 3 cycles Administration: 6 mg (12/31/2017), 6 mg (01/21/2018), 6 mg (02/11/2018) pegfilgrastim (NEULASTA ONPRO KIT) injection 6 mg, 6 mg, Subcutaneous, Once, [name], 2 mg, Intravenous,  Once, 4 of 4 cycles Administration: 2 mg (12/28/2017), 2 mg (01/18/2018), 2 mg (02/08/2018), 2 mg (02/28/2018) riTUXimab (RITUXAN) 600 mg in sodium chloride 0.9 % 250 mL (1.9355 mg/mL) infusion, 375 mg/m2 = 600 mg, Intravenous,  Once, [name], Cycle 6, Reason: Other (see comments)) Administration: 600 mg (12/28/2017) cyclophosphamide (CYTOXAN) 1,[name], 750 mg/m2 = 1,260 mg, Intravenous,  Once, [name],260 mg (12/28/2017), 1,260 mg (01/18/2018), 1,260 mg (02/08/2018), 1,260 mg (02/28/2018) riTUXimab (RITUXAN) 600 mg in sodium chloride 0.9 % 190 mL infusion, 375 mg/m2 = 600 mg, Intravenous,  Once, [name], Cycle 7) Administration: 600 mg (01/18/2018), 600 mg (02/08/2018), 600 mg (02/28/2018), 600 mg (05/01/2018), 600 mg (07/01/2018), 600 mg (08/30/2018), 600 mg (10/30/2018), 600 mg (12/30/2018), 600 mg (03/03/2019), 600 mg (05/05/2019), 600 mg (06/30/2019), 600 mg (09/01/2019), 600 mg (11/03/2019), 600 mg (01/05/2020), 600 mg (03/05/2020) Tbo-Filgrastim (GRANIX) injection 480 mcg, 480 mcg (100 % of original dose 480 mcg), Subcutaneous,  Once, [name], Cycle 4)   for chemotherapy treatment.       CANCER STAGING:  Cancer Staging  No matching staging information was found for the patient.  INTERVAL HISTORY:  Ms. [name], a 81 y.o. female, returns for routine follow-up of her high-grade follicular lymphoma. Aroush was last seen on 03/31/2021.   Today she reports feeling good. She denies fevers, night sweats, weight loss, skin rash, ankle swellings, and infections.   REVIEW OF SYSTEMS:  Review of Systems  Constitutional:  Negative for appetite change, fatigue, fever and unexpected weight change.  Cardiovascular:  Negative for leg swelling.  Skin:  Negative for rash.  Neurological:  Positive for numbness.  All other systems reviewed and are negative.  PAST MEDICAL/SURGICAL HISTORY:  Past Medical History:  Diagnosis Date   Arthritis    back and hips   Dementia (HCC)    mild per patient  Dyspnea    with exertion   GERD (gastroesophageal reflux disease)    TUMS   Hypercholesterolemia    Hypertension    Non Hodgkin's lymphoma ([place])    Past Surgical History:  Procedure Laterality Date   LYMPH NODE BIOPSY Left 11/27/2017   Procedure: LEFT INGUINAL  LYMPH NODE BIOPSY;  Surgeon: Stark Klein, MD;  Location: [place];  Service: General;  Laterality: Left;   MRI  01/19/2021   with anesthesia at [place] Left 04/14/2021   Procedure: PORT REMOVAL;  Surgeon: Stark Klein, MD;  Location: [place];  Service: General;  Laterality: Left;   PORTACATH PLACEMENT Left 12/12/2017   Procedure: INSERTION PORT-A-CATH;  Surgeon: Stark Klein, MD;  Location: [place];  Service: General;  Laterality: Left;   TONSILLECTOMY      SOCIAL HISTORY:  Social History   Socioeconomic History   Marital status: Widowed    Spouse name: Not on file   Number of children: Not on file   Years of education: Not on file   Highest education level: Not on file  Occupational History   Not on file  Tobacco Use   Smoking status: Never   Smokeless tobacco: Never  Vaping Use   Vaping Use: Never used  Substance and Sexual Activity   Alcohol use: No   Drug use: No   Sexual activity: Not Currently    Birth control/protection: Post-menopausal  Other Topics Concern   Not on file  Social History Narrative   Not on file   Social Determinants of Health   Financial Resource Strain: Low Risk    Difficulty of Paying Living Expenses: Not hard at all  Food Insecurity: No Food Insecurity   Worried About Charity fundraiser in the Last Year: Never true   [place] in the Last Year: Never true  Transportation Needs: No Transportation Needs   Lack of Transportation (Medical): No   Lack of Transportation (Non-Medical): No  Physical Activity: Inactive   Days of Exercise per Week: 0 days   Minutes of Exercise per Session: 0 min  Stress: No Stress Concern Present   Feeling of Stress : Only a little  Social Connections: Moderately Isolated   Frequency of Communication with Friends and Family: More than three times a week   Frequency of Social Gatherings with Friends and Family: Never   Attends Religious Services: More than 4 times  per year   Active Member of [employer] or Organizations: No   Attends Archivist Meetings: Never   Marital Status: Widowed  Human resources officer Violence: Not At Risk   Fear of Current or Ex-Partner: No   Emotionally Abused: No   Physically Abused: No   Sexually Abused: No    FAMILY HISTORY:  Family History  Problem Relation Age of Onset   Lung cancer Mother    Kidney cancer Father    Alcoholism Brother    Emphysema Maternal Aunt    Cancer Maternal Uncle    Cancer Maternal Uncle     CURRENT MEDICATIONS:  Current Outpatient Medications  Medication Sig Dispense Refill   acetaminophen (TYLENOL) 500 MG tablet Take 500 mg by mouth at bedtime.     bisoprolol-hydrochlorothiazide (ZIAC) 5-6.25 MG tablet Take 2 tablets by mouth daily.     diphenhydramine-acetaminophen (TYLENOL PM) 25-500 MG TABS tablet Take 0.5 tablets by mouth at bedtime.     Melatonin 10 MG TABS Take 10 mg by mouth at bedtime.  MODERNA COVID-19 VACCINE 100 MCG/0.5ML injection      Multiple Vitamins-Minerals (PRESERVISION AREDS) CAPS Take 1 capsule by mouth daily.     naproxen sodium (ALEVE) 220 MG tablet Take 220 mg by mouth daily as needed (Pain).     simvastatin (ZOCOR) 20 MG tablet Take 20 mg by mouth at bedtime.     No current facility-administered medications for this visit.    ALLERGIES:  No Known Allergies  PHYSICAL EXAM:  Performance status (ECOG): 1 - Symptomatic but completely ambulatory  Vitals:   10/26/21 1124  BP: (!) 141/77  Pulse: (!) 55  Resp: 18  Temp: 98.3 F (36.8 C)  SpO2: 98%   Wt Readings from Last 3 Encounters:  10/26/21 134 lb 14.7 oz (61.2 kg)  04/14/21 131 lb (59.4 kg)  03/31/21 131 lb 1.6 oz (59.5 kg)   Physical Exam Vitals reviewed.  Constitutional:      Appearance: Normal appearance.  Neck:     Comments: Slight prominence on left side Cardiovascular:     Rate and Rhythm: Normal rate and regular rhythm.     Pulses: Normal pulses.     Heart sounds: Normal heart  sounds.  Pulmonary:     Effort: Pulmonary effort is normal.     Breath sounds: Normal breath sounds.  Abdominal:     Palpations: Abdomen is soft. There is no hepatomegaly, splenomegaly or mass.     Tenderness: There is no abdominal tenderness.  Musculoskeletal:     Right lower leg: No edema.     Left lower leg: No edema.  Lymphadenopathy:     Cervical: No cervical adenopathy.     Right cervical: No superficial, deep or posterior cervical adenopathy.    Left cervical: No superficial, deep or posterior cervical adenopathy.     Upper Body:     Right upper body: No supraclavicular adenopathy.     Left upper body: No supraclavicular adenopathy.     Lower Body: No right inguinal adenopathy. No left inguinal adenopathy.  Neurological:     General: No focal deficit present.     Mental Status: She is alert and oriented to person, place, and time.  Psychiatric:        Mood and Affect: Mood normal.        Behavior: Behavior normal.     LABORATORY DATA:  I have reviewed the labs as listed.  CBC Latest Ref Rng & Units 10/19/2021 04/14/2021 03/10/2021  WBC 4.0 - 10.5 K/uL 8.3 - 8.6  Hemoglobin 12.0 - 15.0 g/dL 13.4 15.0 14.8  Hematocrit 36.0 - 46.0 % 41.0 44.0 45.5  Platelets 150 - 400 K/uL 169 - 166   CMP Latest Ref Rng & Units 10/19/2021 04/14/2021 04/14/2021  Glucose 70 - 99 mg/dL 129(H) 115(H) 97  BUN 8 - 23 mg/dL $Remove'19 14 20  '[id number]$ Creatinine 0.44 - 1.00 mg/dL 1.23(H) 0.80 0.88  Sodium 135 - 145 mmol/L 141 139 141  Potassium 3.5 - 5.1 mmol/L 3.6 4.1 5.5(H)  Chloride 98 - 111 mmol/L 106 99 107  CO2 22 - 32 mmol/L 28 - 26  Calcium 8.9 - 10.3 mg/dL 8.9 - 9.2  Total Protein 6.5 - 8.1 g/dL 6.1(L) - -  Total Bilirubin 0.3 - 1.2 mg/dL 0.7 - -  Alkaline Phos 38 - 126 U/L 64 - -  AST 15 - 41 U/L 21 - -  ALT 0 - 44 U/L 19 - -    DIAGNOSTIC IMAGING:  I have independently reviewed the scans and  discussed with the patient. No results found.   ASSESSMENT:  1.  High-grade follicular  lymphoma: -Presentation with left iliac lymph node enlargement, no B symptoms, initial node biopsy on 10/19/2016 consistent with non-Hodgkin's lymphoma. -Excisional biopsy on 11/27/2017 shows high-grade follicular lymphoma (grade 3A). -4 cycles of R-CHOP from 12/28/2017 through 02/28/2018.  She had difficulty breathing after second and third treatments. -Maintenance rituximab from 05/01/2018 through 03/05/2020. -CT CAP on 06/29/2020 shows resolution of right middle lobe pulmonary nodule.  No abdominal or pelvic adenopathy.  No findings of lymphoma.   2.  Interstitial lung disease: -Evaluated by pulmonary and was thought to have interstitial lung disease. -Mild dyspnea on exertion is stable. -Recent CT chest on 12/31/2019 showed nonspecific lung nodules measuring up to 4 mm.  New small nodule in the right middle lobe measuring 3 mm. -CT on 06/29/2020 with resolution of right middle lobe lung nodule.   PLAN:  1.  High-grade follicular lymphoma: - She does not have any fevers, night sweats or weight loss.  No infections. - Physical examination today did not reveal any palpable adenopathy or splenomegaly. - She has port discontinued. - Reviewed labs today which showed normal LFTs and LDH.  CBC was normal. - Recommend follow-up in 6 months.  We will plan to repeat CT scan CAP and LDH.   2.  Interstitial lung disease: - CT scan in September 2021 showed resolution of right middle lobe lung nodule. - Current CT scan showed few scattered sub-4 mm pulmonary nodules which are stable.   3.  Elevated creatinine: - Creatinine today increased to 1.23 from her baseline of 0.8.  She is on diuretic.   Orders placed this encounter:  Orders Placed This Encounter  Procedures   CT CHEST [place], MD [place] [phone]   I, Thana Ates, am acting as a scribe for Dr. Derek Jack.  I, Derek Jack MD, have reviewed the above  documentation for accuracy and completeness, and I agree with the above.

## 2022-01-05 DIAGNOSIS — E782 Mixed hyperlipidemia: Secondary | ICD-10-CM | POA: Diagnosis not present

## 2022-01-05 DIAGNOSIS — R7301 Impaired fasting glucose: Secondary | ICD-10-CM | POA: Diagnosis not present

## 2022-01-09 DIAGNOSIS — J849 Interstitial pulmonary disease, unspecified: Secondary | ICD-10-CM | POA: Insufficient documentation

## 2022-01-09 DIAGNOSIS — R42 Dizziness and giddiness: Secondary | ICD-10-CM | POA: Diagnosis not present

## 2022-01-09 DIAGNOSIS — G4700 Insomnia, unspecified: Secondary | ICD-10-CM | POA: Diagnosis not present

## 2022-01-09 DIAGNOSIS — H3530 Unspecified macular degeneration: Secondary | ICD-10-CM | POA: Diagnosis not present

## 2022-01-09 DIAGNOSIS — I10 Essential (primary) hypertension: Secondary | ICD-10-CM | POA: Diagnosis not present

## 2022-01-09 DIAGNOSIS — G3184 Mild cognitive impairment, so stated: Secondary | ICD-10-CM | POA: Diagnosis not present

## 2022-01-09 DIAGNOSIS — C8590 Non-Hodgkin lymphoma, unspecified, unspecified site: Secondary | ICD-10-CM | POA: Insufficient documentation

## 2022-01-09 DIAGNOSIS — R911 Solitary pulmonary nodule: Secondary | ICD-10-CM | POA: Diagnosis not present

## 2022-01-09 DIAGNOSIS — R7303 Prediabetes: Secondary | ICD-10-CM | POA: Diagnosis not present

## 2022-01-09 DIAGNOSIS — E782 Mixed hyperlipidemia: Secondary | ICD-10-CM | POA: Diagnosis not present

## 2022-03-02 ENCOUNTER — Ambulatory Visit: Payer: Medicare HMO | Admitting: Internal Medicine

## 2022-03-02 ENCOUNTER — Ambulatory Visit (HOSPITAL_COMMUNITY)
Admission: RE | Admit: 2022-03-02 | Discharge: 2022-03-02 | Disposition: A | Discharge status: Home / Self Care | Payer: Medicare HMO | Source: Ambulatory Visit | Attending: Internal Medicine | Admitting: Internal Medicine

## 2022-03-02 ENCOUNTER — Encounter: Payer: Self-pay | Admitting: Internal Medicine

## 2022-03-02 DIAGNOSIS — R0609 Other forms of dyspnea: Secondary | ICD-10-CM

## 2022-03-02 DIAGNOSIS — R059 Cough, unspecified: Secondary | ICD-10-CM | POA: Diagnosis not present

## 2022-03-02 DIAGNOSIS — R0602 Shortness of breath: Secondary | ICD-10-CM | POA: Diagnosis not present

## 2022-03-02 MED ORDER — FAMOTIDINE 20 MG PO TABS: ORAL_TABLET | ORAL | 11 refills | Status: DC

## 2022-03-02 MED ORDER — PANTOPRAZOLE SODIUM 40 MG PO TBEC: 40.0000 mg | DELAYED_RELEASE_TABLET | Freq: Every day | ORAL | 2 refills | Status: DC

## 2022-03-02 NOTE — Progress Notes (Signed)
Alicia [name], female    DOB: 11-19-1940, 81 y.o.   MRN: [id number]   Brief patient profile:  57   yowf  never smoker self referred to pulmonary clinic in [place]  03/02/2022 for progressive doe since last MR eval in [place] around 2019 attributed to chemo for Non-hogkins.   PFts 06/06/18 no obst and dlco trending up / corrected to 74%   Oncology eval 10/26/21 Dr Raliegh Ip  REASON FOR VISIT:  Follow-up for high-grade follicular lymphoma   PRIOR THERAPY:  1. R-CHOP x 4 cycles from 12/28/2017 to 02/28/2018. 2. Maintenance rituximab from 05/01/2018 to 03/05/2020.   NGS Results: not done   CURRENT THERAPY: surveillance   History of Present Illness  03/02/2022  Pulmonary/ 1st office eval/ Licet Dunphy / [place] [facility]  Chief Complaint  Patient presents with   Consult    Breathing has worsened since she had cancer in 2019.   Dyspnea:  lots of walking at [employer]/ does not HC parking  Cough: slt raspy voice some cough non productive  Sleep: fine flat SABA use: did not help in past  No obvious day to day or daytime variability or assoc excess/ purulent sputum or mucus plugs or hemoptysis or cp or chest tightness, subjective wheeze or overt sinus or hb symptoms.   Sleeping  without nocturnal  or early am exacerbation  of respiratory  c/o's or need for noct saba. Also denies any obvious fluctuation of symptoms with weather or environmental changes or other aggravating or alleviating factors except as outlined above   No unusual exposure hx or h/o childhood pna/ asthma or knowledge of premature birth.  Current Allergies, Complete Past Medical History, Past Surgical History, Family History, and Social History were reviewed in [employer] record.  ROS  The following are not active complaints unless bolded Hoarseness, sore throat, dysphagia, dental problems, itching, sneezing,  nasal congestion or discharge of excess mucus or purulent secretions, ear ache,   fever, chills, sweats,  unintended wt loss or wt gain, classically pleuritic or exertional cp,  orthopnea pnd or arm/hand swelling  or leg swelling, presyncope, palpitations, abdominal pain, anorexia, nausea, vomiting, diarrhea  or change in bowel habits or change in bladder habits, change in stools or change in urine, dysuria, hematuria,  rash, arthralgias, visual complaints, headache, numbness, weakness or ataxia or problems with walking or coordination,  change in mood or  memory.           Past Medical History:  Diagnosis Date   Arthritis    back and hips   Dementia ([place])    mild per patient   Dyspnea    with exertion   GERD (gastroesophageal reflux disease)    TUMS   Hypercholesterolemia    Hypertension    Non Hodgkin's lymphoma ([place])     Outpatient Medications Prior to Visit  Medication Sig Dispense Refill   acetaminophen (TYLENOL) 500 MG tablet Take 500 mg by mouth at bedtime.     bisoprolol-hydrochlorothiazide (ZIAC) 5-6.25 MG tablet Take 2 tablets by mouth daily.     diphenhydramine-acetaminophen (TYLENOL PM) 25-500 MG TABS tablet Take 0.5 tablets by mouth at bedtime.     Melatonin 10 MG TABS Take 10 mg by mouth at bedtime.     Multiple Vitamins-Minerals (PRESERVISION AREDS) CAPS Take 1 capsule by mouth daily.     naproxen sodium (ALEVE) 220 MG tablet Take 220 mg by mouth daily as needed (Pain).     simvastatin (ZOCOR) 20 MG tablet Take  20 mg by mouth at bedtime.     MODERNA COVID-19 VACCINE 100 MCG/0.5ML injection      No facility-administered medications prior to visit.     Objective:     BP 128/76 (BP Location: Left Arm, Patient Position: Sitting)   Pulse 62   Temp 98.7 F (37.1 C) (Temporal)   Ht '5\' 6"'$  (1.676 m)   Wt 138 lb (62.6 kg)   SpO2 97% Comment: ra  BMI 22.27 kg/m   SpO2: 97 % (ra)  Amb pleasant wf slt raspy voice     HEENT : Oropharynx  clear  Nasal turbintes nl    NECK :  without  appent JVD/ TM/  mild fullness L [place] area s discrete mass/ or node    LUNGS: no  acc muscle use,  Nl contour chest which is clear to A and P bilaterally without cough on insp or exp maneuvers   CV:  RRR  no s3 or murmur or increase in P2, and no edema   ABD:  soft and nontender with nl inspiratory excursion in the supine position. No bruits or organomegaly appreciated   MS:  Nl gait/ ext warm without deformities Or obvious joint restrictions  calf tenderness, cyanosis or clubbing     SKIN: warm and dry without lesions    NEURO:  alert, approp, nl sensorium with  no motor or cerebellar deficits apparent.     CXR PA and Lateral:   03/02/2022 :    I personally reviewed images and impression is as follows:         Nl lung volumes, nl heart size, shape  = no active dz   Assessment   DOE (dyspnea on exertion) Onset 2019 p CHOP for non HL follicular high grade PFts 06/06/18 no obst and dlco trending up / corrected to 74%  - 03/02/2022   Walked on RA  x  3  lap(s) =  approx 450  ft  @  Fast  pace, stopped due to end of study with lowest 02 sats 95% with sob on 2nd/3rd lap  Symptoms are   disproportionate to objective findings and not clear to what extent this is actually a pulmonary  problem but pt does appear to have difficult to sort out respiratory symptoms of unknown origin for which  DDX  = almost all start with A and  include Adherence, Ace Inhibitors, Acid Reflux, Active Sinus Disease, Alpha 1 Antitripsin deficiency, Anxiety masquerading as Airways dz,  ABPA,  Allergy(esp in young), Aspiration (esp in elderly), Adverse effects of meds,  Active smoking or Vaping, A bunch of PE's/clot burden (a few small clots can't cause this syndrome unless there is already severe underlying pulm or vascular dz with poor reserve),  Anemia or thyroid disorder, plus two Bs  = Bronchiectasis and Beta blocker use..and one C= CHF   Adherence is always the initial "prime suspect" and is a multilayered concern that requires a "trust but verify" approach in every patient - starting with knowing  how to use medications, especially inhalers, correctly, keeping up with refills and understanding the fundamental difference between maintenance and prns vs those medications only taken for a very short course and then stopped and not refilled.   ? Acid (or non-acid) GERD > always difficult to exclude as up to 75% of pts in some series report no assoc GI/ Heartburn symptoms and the only finding on exam is related to voice texture c/w LPR > rec max (24h)  acid  suppression and diet restrictions/ reviewed and instructions given in writing.   ? allerg /asthma > nothing to suggest   ? BB effects > not usually seen with bisoprolol  ? CHF nothing to suggest    rec max gerd rx and f/u with pfts next avail   In meantime monitor serial 02 sats with ex and call if trending down or any new symptoms with exertion eg cp   Each maintenance medication was reviewed in detail including emphasizing most importantly the difference between maintenance and prns and under what circumstances the prns are to be triggered using an action plan format where appropriate.  Total time for H and P, chart review, counseling,  directly observing portions of ambulatory 02 saturation study/ and generating customized AVS unique to this office visit / same day charting  > 45 min with pt new to me                       Christinia Gully, MD 03/02/2022

## 2022-03-02 NOTE — Patient Instructions (Addendum)
Make sure you check your oxygen saturation at your highest level of activity to be sure it stays over 90% and keep track of it at least once a week, more often if breathing getting worse, and let me know if losing ground.   Pantoprazole (protonix) 40 mg   Take  30-60 min before first meal of the day and Pepcid (famotidine)  20 mg after supper until return to office - this is the best way to tell whether stomach acid is contributing to your problem.    GERD (REFLUX)  is an extremely common cause of respiratory symptoms just like yours , many times with no obvious heartburn at all.    It can be treated with medication, but also with lifestyle changes including elevation of the head of your bed (ideally with 6 -8inch blocks under the headboard of your bed),  Smoking cessation, avoidance of late meals, excessive alcohol, and avoid fatty foods, chocolate, peppermint, colas, red wine, and acidic juices such as orange juice.  NO MINT OR MENTHOL PRODUCTS SO NO COUGH DROPS  USE SUGARLESS CANDY INSTEAD (Jolley ranchers or Stover's or Life Savers) or even ice chips will also do - the key is to swallow to prevent all throat clearing. NO OIL BASED VITAMINS - use powdered substitutes.  Avoid fish oil when coughing.   We will schedule you for PFTs next available (ok to go to [facility] if necessary)   Please schedule a follow up office visit in 6 weeks, call sooner if needed

## 2022-03-03 ENCOUNTER — Encounter: Payer: Self-pay | Admitting: Hematology

## 2022-03-03 ENCOUNTER — Encounter: Payer: Self-pay | Admitting: Internal Medicine

## 2022-03-03 LAB — BASIC METABOLIC PANEL
BUN/Creatinine Ratio: 18 (ref 12–28)
BUN: 16 mg/dL (ref 8–27)
CO2: 27 mmol/L (ref 20–29)
Calcium: 9.7 mg/dL (ref 8.7–10.3)
Chloride: 104 mmol/L (ref 96–106)
Creatinine, Ser: 0.9 mg/dL (ref 0.57–1.00)
Glucose: 120 mg/dL — ABNORMAL HIGH (ref 70–99)
Potassium: 3.8 mmol/L (ref 3.5–5.2)
Sodium: 145 mmol/L — ABNORMAL HIGH (ref 134–144)
eGFR: 64 mL/min/{1.73_m2} (ref >59)

## 2022-03-03 LAB — TSH: TSH: 2.07 u[IU]/mL (ref 0.450–4.500)

## 2022-03-03 LAB — BRAIN NATRIURETIC PEPTIDE: BNP: 99.7 pg/mL (ref 0.0–100.0)

## 2022-03-03 LAB — D-DIMER, QUANTITATIVE: D-DIMER: 0.36 mg/L FEU (ref 0.00–0.49)

## 2022-03-03 LAB — SEDIMENTATION RATE: Sed Rate: 2 mm/hr (ref 0–40)

## 2022-03-03 NOTE — Assessment & Plan Note (Addendum)
Onset 2019 p CHOP for non HL follicular high grade PFts 06/06/18 no obst and dlco trending up / corrected to 74%  - 03/02/2022   Walked on RA  x  3  lap(s) =  approx 450  ft  @  Fast  pace, stopped due to end of study with lowest 02 sats 95% with sob on 2nd/3rd lap  Symptoms are   disproportionate to objective findings and not clear to what extent this is actually a pulmonary  problem but pt does appear to have difficult to sort out respiratory symptoms of unknown origin for which  DDX  = almost all start with A and  include Adherence, Ace Inhibitors, Acid Reflux, Active Sinus Disease, Alpha 1 Antitripsin deficiency, Anxiety masquerading as Airways dz,  ABPA,  Allergy(esp in young), Aspiration (esp in elderly), Adverse effects of meds,  Active smoking or Vaping, A bunch of PE's/clot burden (a few small clots can't cause this syndrome unless there is already severe underlying pulm or vascular dz with poor reserve),  Anemia or thyroid disorder, plus two Bs  = Bronchiectasis and Beta blocker use..and one C= CHF   Adherence is always the initial "prime suspect" and is a multilayered concern that requires a "trust but verify" approach in every patient - starting with knowing how to use medications, especially inhalers, correctly, keeping up with refills and understanding the fundamental difference between maintenance and prns vs those medications only taken for a very short course and then stopped and not refilled.   ? Acid (or non-acid) GERD > always difficult to exclude as up to 75% of pts in some series report no assoc GI/ Heartburn symptoms and the only finding on exam is related to voice texture c/w LPR > rec max (24h)  acid suppression and diet restrictions/ reviewed and instructions given in writing.   ? allerg /asthma > nothing to suggest   ? Adverse drug effects >  No recent rx with the usual suspects per her hx ie acei, chemo, amio macrodantin  ? BB effects > not usually seen with bisoprolol  ?  CHF nothing to suggest    rec max gerd rx and f/u with pfts next avail   In meantime monitor serial 02 sats with ex and call if trending down or any new symptoms with exertion eg cp   Each maintenance medication was reviewed in detail including emphasizing most importantly the difference between maintenance and prns and under what circumstances the prns are to be triggered using an action plan format where appropriate.  Total time for H and P, chart review, counseling,  directly observing portions of ambulatory 02 saturation study/ and generating customized AVS unique to this office visit / same day charting  > 45 min with pt new to me

## 2022-03-06 ENCOUNTER — Other Ambulatory Visit (HOSPITAL_COMMUNITY): Payer: Self-pay | Admitting: *Deleted

## 2022-03-06 ENCOUNTER — Telehealth (HOSPITAL_COMMUNITY): Payer: Self-pay | Admitting: *Deleted

## 2022-03-06 DIAGNOSIS — C8238 Follicular lymphoma grade IIIa, lymph nodes of multiple sites: Secondary | ICD-10-CM

## 2022-03-06 NOTE — Telephone Encounter (Addendum)
Patient called to advise that she is having fullness without pain in the lymph nodes in left side of her neck.  Appointments for labs and follow up moved up for assessment to 5/24.  Patient made aware.

## 2022-03-08 ENCOUNTER — Inpatient Hospital Stay (HOSPITAL_COMMUNITY): Payer: Medicare HMO

## 2022-03-08 ENCOUNTER — Inpatient Hospital Stay (HOSPITAL_COMMUNITY): Payer: Medicare HMO | Attending: Hematology | Admitting: Hematology

## 2022-03-08 VITALS — BP 172/84 | HR 52 | Temp 97.5°F | Resp 16 | Ht 66.0 in | Wt 134.9 lb

## 2022-03-08 DIAGNOSIS — C8290 Follicular lymphoma, unspecified, unspecified site: Secondary | ICD-10-CM | POA: Diagnosis not present

## 2022-03-08 DIAGNOSIS — F17210 Nicotine dependence, cigarettes, uncomplicated: Secondary | ICD-10-CM | POA: Insufficient documentation

## 2022-03-08 DIAGNOSIS — Z79899 Other long term (current) drug therapy: Secondary | ICD-10-CM | POA: Diagnosis not present

## 2022-03-08 DIAGNOSIS — C8238 Follicular lymphoma grade IIIa, lymph nodes of multiple sites: Secondary | ICD-10-CM

## 2022-03-08 DIAGNOSIS — R69 Illness, unspecified: Secondary | ICD-10-CM | POA: Diagnosis not present

## 2022-03-08 LAB — CBC WITH DIFFERENTIAL/PLATELET
Abs Immature Granulocytes: 0.01 10*3/uL (ref 0.00–0.07)
Basophils Absolute: 0.1 10*3/uL (ref 0.0–0.1)
Basophils Relative: 1 %
Eosinophils Absolute: 0.1 10*3/uL (ref 0.0–0.5)
Eosinophils Relative: 2 %
HCT: 42 % (ref 36.0–46.0)
Hemoglobin: 13.6 g/dL (ref 12.0–15.0)
Immature Granulocytes: 0 %
Lymphocytes Relative: 34 %
Lymphs Abs: 2.3 10*3/uL (ref 0.7–4.0)
MCH: 29.1 pg (ref 26.0–34.0)
MCHC: 32.4 g/dL (ref 30.0–36.0)
MCV: 89.7 fL (ref 80.0–100.0)
Monocytes Absolute: 0.7 10*3/uL (ref 0.1–1.0)
Monocytes Relative: 10 %
Neutro Abs: 3.5 10*3/uL (ref 1.7–7.7)
Neutrophils Relative %: 53 %
Platelets: 158 10*3/uL (ref 150–400)
RBC: 4.68 MIL/uL (ref 3.87–5.11)
RDW: 14 % (ref 11.5–15.5)
WBC: 6.7 10*3/uL (ref 4.0–10.5)
nRBC: 0 % (ref 0.0–0.2)

## 2022-03-08 LAB — COMPREHENSIVE METABOLIC PANEL
ALT: 17 U/L (ref 0–44)
AST: 22 U/L (ref 15–41)
Albumin: 4.1 g/dL (ref 3.5–5.0)
Alkaline Phosphatase: 66 U/L (ref 38–126)
Anion gap: 6 (ref 5–15)
BUN: 21 mg/dL (ref 8–23)
CO2: 29 mmol/L (ref 22–32)
Calcium: 9.4 mg/dL (ref 8.9–10.3)
Chloride: 104 mmol/L (ref 98–111)
Creatinine, Ser: 1.15 mg/dL — ABNORMAL HIGH (ref 0.44–1.00)
GFR, Estimated: 48 mL/min — ABNORMAL LOW (ref >60)
Glucose, Bld: 88 mg/dL (ref 70–99)
Potassium: 3.5 mmol/L (ref 3.5–5.1)
Sodium: 139 mmol/L (ref 135–145)
Total Bilirubin: 1 mg/dL (ref 0.3–1.2)
Total Protein: 6.4 g/dL — ABNORMAL LOW (ref 6.5–8.1)

## 2022-03-08 LAB — LACTATE DEHYDROGENASE: LDH: 154 U/L (ref 98–192)

## 2022-03-08 NOTE — Progress Notes (Signed)
[facility] [address] [place] [address]   CLINIC:  Medical Oncology/Hematology  PCP:  Celene Squibb, MD [address] [name] [address] [phone]   REASON FOR VISIT:  Follow-up for high-grade follicular lymphoma  PRIOR THERAPY:  1. R-CHOP x 4 cycles from 12/28/2017 to 02/28/2018. 2. Maintenance rituximab from 05/01/2018 to 03/05/2020.  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 ([place])    12/28/2017 -  Chemotherapy   The patient had DOXOrubicin (ADRIAMYCIN) chemo injection 84 mg, 50 mg/m2 = 84 mg, Intravenous,  Once, 4 of 4 cycles Administration: 84 mg (12/28/2017), 84 mg (01/18/2018), 84 mg (02/08/2018), 84 mg (02/28/2018) palonosetron (ALOXI) injection 0.25 mg, 0.25 mg, Intravenous,  Once, 4 of 4 cycles Administration: 0.25 mg (12/28/2017), 0.25 mg (01/18/2018), 0.25 mg (02/08/2018), 0.25 mg (02/28/2018) pegfilgrastim (NEULASTA) injection 6 mg, 6 mg, Subcutaneous, Once, 3 of 3 cycles Administration: 6 mg (12/31/2017), 6 mg (01/21/2018), 6 mg (02/11/2018) pegfilgrastim (NEULASTA ONPRO KIT) injection 6 mg, 6 mg, Subcutaneous, Once, [name], 2 mg, Intravenous,  Once, 4 of 4 cycles Administration: 2 mg (12/28/2017), 2 mg (01/18/2018), 2 mg (02/08/2018), 2 mg (02/28/2018) riTUXimab (RITUXAN) 600 mg in sodium chloride 0.9 % 250 mL (1.9355 mg/mL) infusion, 375 mg/m2 = 600 mg, Intravenous,  Once, [name], Cycle 6, Reason: Other (see comments)) Administration: 600 mg (12/28/2017) cyclophosphamide (CYTOXAN) 1,[name], 750 mg/m2 = 1,260 mg, Intravenous,  Once, [name],260 mg (12/28/2017), 1,260 mg (01/18/2018), 1,260 mg (02/08/2018), 1,260 mg (02/28/2018) riTUXimab (RITUXAN) 600 mg in sodium chloride 0.9 % 190 mL infusion, 375 mg/m2 = 600 mg, Intravenous,  Once, [name], Cycle 7) Administration: 600 mg (01/18/2018), 600 mg (02/08/2018), 600 mg (02/28/2018), 600 mg (05/01/2018), 600 mg (07/01/2018), 600 mg (08/30/2018), 600 mg (10/30/2018), 600 mg (12/30/2018), 600 mg (03/03/2019), 600 mg (05/05/2019), 600 mg (06/30/2019), 600 mg (09/01/2019), 600 mg (11/03/2019), 600 mg (01/05/2020), 600 mg (03/05/2020) Tbo-Filgrastim (GRANIX) injection 480 mcg, 480 mcg (100 % of original dose 480 mcg), Subcutaneous,  Once, [name], [name], a 81 y.o. female, returns for routine follow-up of her high-grade follicular lymphoma. Alicia [name] was last seen on 10/26/2021.   Today she reports feeling well. She reports increased swelling on the left side of her throat starting 2 months ago. She also reports hoarseness in her voice. She denies night sweats, fevers, and weight loss. She reports dry cough. She reports SOB when bending over.   REVIEW OF SYSTEMS:  Review of Systems  Constitutional:  Negative for appetite change, fatigue, fever and unexpected weight change.  HENT:   Positive for lump/mass (L side of throat) and voice change (hoarse).   Respiratory:  Positive for cough (dry) and shortness of breath.   Endocrine: Negative  for hot flashes.  Musculoskeletal:  Positive for back pain (10/10).  Neurological:  Positive for numbness.  Psychiatric/Behavioral:  Positive for sleep disturbance.   All other systems reviewed and are negative.  PAST MEDICAL/SURGICAL HISTORY:  Past Medical  History:  Diagnosis Date   Arthritis    back and hips   Dementia ([place])    mild per patient   Dyspnea    with exertion   GERD (gastroesophageal reflux disease)    TUMS   Hypercholesterolemia    Hypertension    Non Hodgkin's lymphoma ([place])    Past Surgical History:  Procedure Laterality Date   LYMPH NODE BIOPSY Left 11/27/2017   Procedure: LEFT INGUINAL LYMPH NODE BIOPSY;  Surgeon: Stark Klein, MD;  Location: [facility];  Service: General;  Laterality: Left;   MRI  01/19/2021   with anesthesia at [facility] Left 04/14/2021   Procedure: PORT REMOVAL;  Surgeon: Stark Klein, MD;  Location: [facility];  Service: General;  Laterality: Left;   PORTACATH PLACEMENT Left 12/12/2017   Procedure: INSERTION PORT-A-CATH;  Surgeon: Stark Klein, MD;  Location: [facility];  Service: General;  Laterality: Left;   TONSILLECTOMY      SOCIAL HISTORY:  Social History   Socioeconomic History   Marital status: Widowed    Spouse name: Not on file   Number of children: Not on file   Years of education: Not on file   Highest education level: Not on file  Occupational History   Not on file  Tobacco Use   Smoking status: Never   Smokeless tobacco: Never  Vaping Use   Vaping Use: Never used  Substance and Sexual Activity   Alcohol use: No   Drug use: No   Sexual activity: Not Currently    Birth control/protection: Post-menopausal  Other Topics Concern   Not on file  Social History Narrative   Not on file   Social Determinants of Health   Financial Resource Strain: Low Risk    Difficulty of Paying Living Expenses: Not hard at all  Food Insecurity: No Food Insecurity   Worried About Charity fundraiser in the Last Year: Never true   [place] in the Last Year: Never true  Transportation Needs: No Transportation Needs   Lack of Transportation (Medical): No   Lack of Transportation (Non-Medical): No  Physical Activity: Inactive   Days of Exercise  per Week: 0 days   Minutes of Exercise per Session: 0 min  Stress: No Stress Concern Present   Feeling of Stress : Only a little  Social Connections: Moderately Isolated   Frequency of Communication with Friends and Family: More than three times a week   Frequency of Social Gatherings with Friends and Family: Never   Attends Religious Services: More than 4 times per year   Active Member of [employer] or Organizations: No   Attends Archivist Meetings: Never   Marital Status: Widowed  Human resources officer Violence: Not At Risk   Fear of Current or Ex-Partner: No   Emotionally Abused: No   Physically Abused: No   Sexually Abused: No    FAMILY HISTORY:  Family History  Problem Relation Age of Onset   Lung cancer Mother    Kidney cancer Father    Alcoholism Brother    Emphysema Maternal Aunt    Cancer Maternal Uncle    Cancer Maternal Uncle     CURRENT MEDICATIONS:  Current Outpatient Medications  Medication  Sig Dispense Refill   acetaminophen (TYLENOL) 500 MG tablet Take 500 mg by mouth at bedtime.     bisoprolol-hydrochlorothiazide (ZIAC) 5-6.25 MG tablet Take 2 tablets by mouth daily.     diphenhydramine-acetaminophen (TYLENOL PM) 25-500 MG TABS tablet Take 0.5 tablets by mouth at bedtime.     famotidine (PEPCID) 20 MG tablet One after supper 30 tablet 11   Melatonin 10 MG TABS Take 10 mg by mouth at bedtime.     Multiple Vitamins-Minerals (PRESERVISION AREDS) CAPS Take 1 capsule by mouth daily.     naproxen sodium (ALEVE) 220 MG tablet Take 220 mg by mouth daily as needed (Pain).     pantoprazole (PROTONIX) 40 MG tablet Take 1 tablet (40 mg total) by mouth daily. Take 30-60 min before first meal of the day 30 tablet 2   simvastatin (ZOCOR) 20 MG tablet Take 20 mg by mouth at bedtime.     No current facility-administered medications for this visit.    ALLERGIES:  No Known Allergies  PHYSICAL EXAM:  Performance status (ECOG): 1 - Symptomatic but completely  ambulatory  There were no vitals filed for this visit. Wt Readings from Last 3 Encounters:  03/02/22 138 lb (62.6 kg)  10/26/21 134 lb 14.7 oz (61.2 kg)  04/14/21 131 lb (59.4 kg)   Physical Exam Vitals reviewed.  Constitutional:      Appearance: Normal appearance.  Neck:     Comments: L supraclavicular fullness Cardiovascular:     Rate and Rhythm: Normal rate and regular rhythm.     Pulses: Normal pulses.     Heart sounds: Normal heart sounds.  Pulmonary:     Effort: Pulmonary effort is normal.     Breath sounds: Normal breath sounds.  Abdominal:     Palpations: Abdomen is soft. There is no hepatomegaly, splenomegaly or mass.     Tenderness: There is no abdominal tenderness.  Lymphadenopathy:     Cervical: No cervical adenopathy.     Right cervical: No superficial, deep or posterior cervical adenopathy.    Left cervical: No superficial, deep or posterior cervical adenopathy.     Upper Body:     Right upper body: No supraclavicular or axillary adenopathy.     Left upper body: No supraclavicular or axillary adenopathy.     Lower Body: No right inguinal adenopathy. No left inguinal adenopathy.  Neurological:     General: No focal deficit present.     Mental Status: She is alert and oriented to person, place, and time.  Psychiatric:        Mood and Affect: Mood normal.        Behavior: Behavior normal.     LABORATORY DATA:  I have reviewed the labs as listed.     Latest Ref Rng & Units 03/08/2022    8:21 AM 10/19/2021   10:28 AM 04/14/2021   11:06 AM  CBC  WBC 4.0 - 10.5 K/uL 6.7   8.3     Hemoglobin 12.0 - 15.0 g/dL 13.6   13.4   15.0    Hematocrit 36.0 - 46.0 % 42.0   41.0   44.0    Platelets 150 - 400 K/uL 158   169         Latest Ref Rng & Units 03/08/2022    8:21 AM 03/02/2022    9:19 AM 10/19/2021   10:28 AM  CMP  Glucose 70 - 99 mg/dL 88   120   129    BUN 8 -  23 mg/dL $Remove'21   16   19    'vzehKbb$ Creatinine 0.44 - 1.00 mg/dL 1.15   0.90   1.23    Sodium 135 - 145 mmol/L  139   145   141    Potassium 3.5 - 5.1 mmol/L 3.5   3.8   3.6    Chloride 98 - 111 mmol/L 104   104   106    CO2 22 - 32 mmol/L $RemoveB'29   27   28    'IaKqMjxA$ Calcium 8.9 - 10.3 mg/dL 9.4   9.7   8.9    Total Protein 6.5 - 8.1 g/dL 6.4    6.1    Total Bilirubin 0.3 - 1.2 mg/dL 1.0    0.7    Alkaline Phos 38 - 126 U/L 66    64    AST 15 - 41 U/L 22    21    ALT 0 - 44 U/L 17    19      DIAGNOSTIC IMAGING:  I have independently reviewed the scans and discussed with the patient. DG Chest 2 View  Result Date: 03/03/2022 CLINICAL DATA:  Cough and worsening shortness of breath. EXAM: CHEST - 2 VIEW COMPARISON:  Chest radiograph 11/30/2020 FINDINGS: Stable cardiac and mediastinal contours. Aortic tortuosity. No large area pulmonary consolidation. No pleural effusion or pneumothorax. Thoracic spine degenerative changes. IMPRESSION: No active cardiopulmonary disease. Electronically Signed   By: Lovey Newcomer M.D.   On: 03/03/2022 08:06     ASSESSMENT:  1.  High-grade follicular lymphoma: -Presentation with left iliac lymph node enlargement, no B symptoms, initial node biopsy on 10/19/2016 consistent with non-Hodgkin's lymphoma. -Excisional biopsy on 11/27/2017 shows high-grade follicular lymphoma (grade 3A). -4 cycles of R-CHOP from 12/28/2017 through 02/28/2018.  She had difficulty breathing after second and third treatments. -Maintenance rituximab from 05/01/2018 through 03/05/2020. -CT CAP on 06/29/2020 shows resolution of right middle lobe pulmonary nodule.  No abdominal or pelvic adenopathy.  No findings of lymphoma.   2.  Interstitial lung disease: -Evaluated by pulmonary and was thought to have interstitial lung disease. -Mild dyspnea on exertion is stable. -Recent CT chest on 12/31/2019 showed nonspecific lung nodules measuring up to 4 mm.  New small nodule in the right middle lobe measuring 3 mm. -CT on 06/29/2020 with resolution of right middle lobe lung nodule.   PLAN:  1.  High-grade follicular  lymphoma: - She called our office with complaints of feeling asymmetry in the neck region. - I have examined her neck which showed fullness in the left supraclavicular region with no clear adenopathy.  No neck adenopathy.  She does not have any B symptoms. - Reviewed labs from today which showed normal LDH and LFTs.  CBC was normal. - She has an appointment with me in July with CT scan.  We will included a neck CT at that time.    2.  Interstitial lung disease: - Last CT scan showed few scattered sub-4 mm pulmonary nodule stable.  She reports her breathing has been stable.   3.  Elevated creatinine: - Creatinine today is 1.15 as she is on diuretic.   Orders placed this encounter:  No orders of the defined types were placed in this encounter.    Derek Jack, MD [place] [phone]   I, Thana Ates, am acting as a scribe for Dr. Derek Jack.  I, Derek Jack MD, have reviewed the above documentation for accuracy and completeness, and I agree with the  above.     

## 2022-03-08 NOTE — Patient Instructions (Addendum)
[place] at [facility] Discharge Instructions   You were seen and examined today by Dr. Delton Coombes.  He reviewed the results of you lab work which is normal/stable.  Dr. Raliegh Ip did not feel any prominent lymph nodes in your neck today. However, we will include a CT scan of your neck with your upcoming CT scan.   Return as scheduled.      Thank you for choosing [place] at [facility] to provide your oncology and hematology care.  To afford each patient quality time with our provider, please arrive at least 15 minutes before your scheduled appointment time.   If you have a lab appointment with the [facility] please come in thru the Main Entrance and check in at the main information desk.  You need to re-schedule your appointment should you arrive 10 or more minutes late.  We strive to give you quality time with our providers, and arriving late affects you and other patients whose appointments are after yours.  Also, if you no show three or more times for appointments you may be dismissed from the clinic at the providers discretion.     Again, thank you for choosing [facility].  Our hope is that these requests will decrease the amount of time that you wait before being seen by our physicians.       _____________________________________________________________  Should you have questions after your visit to [facility], please contact our office at [phone] and follow the prompts.  Our office hours are 8:00 a.m. and 4:30 p.m. Monday - Friday.  Please note that voicemails left after 4:00 p.m. may not be returned until the following business day.  We are closed weekends and major holidays.  You do have access to a nurse 24-7, just call the main number to the clinic [phone] and do not press any options, hold on the line and a nurse will answer the phone.    For prescription refill requests, have your pharmacy  contact our office and allow 72 hours.    Due to Covid, you will need to wear a mask upon entering the hospital. If you do not have a mask, a mask will be given to you at the Main Entrance upon arrival. For doctor visits, patients may have 1 support person age 44 or older with them. For treatment visits, patients can not have anyone with them due to social distancing guidelines and our immunocompromised population.

## 2022-03-10 DIAGNOSIS — C8590 Non-Hodgkin lymphoma, unspecified, unspecified site: Secondary | ICD-10-CM | POA: Diagnosis not present

## 2022-03-10 DIAGNOSIS — R2230 Localized swelling, mass and lump, unspecified upper limb: Secondary | ICD-10-CM | POA: Diagnosis not present

## 2022-03-10 DIAGNOSIS — J849 Interstitial pulmonary disease, unspecified: Secondary | ICD-10-CM | POA: Diagnosis not present

## 2022-03-10 DIAGNOSIS — M7989 Other specified soft tissue disorders: Secondary | ICD-10-CM | POA: Insufficient documentation

## 2022-03-15 ENCOUNTER — Other Ambulatory Visit: Payer: Self-pay | Admitting: Internal Medicine

## 2022-03-15 ENCOUNTER — Other Ambulatory Visit (HOSPITAL_COMMUNITY): Payer: Self-pay | Admitting: Internal Medicine

## 2022-03-15 DIAGNOSIS — R229 Localized swelling, mass and lump, unspecified: Secondary | ICD-10-CM

## 2022-03-22 ENCOUNTER — Other Ambulatory Visit: Payer: Self-pay | Admitting: Oncology

## 2022-03-23 DIAGNOSIS — H353132 Nonexudative age-related macular degeneration, bilateral, intermediate dry stage: Secondary | ICD-10-CM | POA: Diagnosis not present

## 2022-03-27 ENCOUNTER — Ambulatory Visit (HOSPITAL_COMMUNITY)
Admission: RE | Admit: 2022-03-27 | Discharge: 2022-03-27 | Disposition: A | Discharge status: Home / Self Care | Payer: Medicare HMO | Source: Ambulatory Visit | Attending: Internal Medicine | Admitting: Internal Medicine

## 2022-03-27 DIAGNOSIS — R229 Localized swelling, mass and lump, unspecified: Secondary | ICD-10-CM | POA: Diagnosis not present

## 2022-03-27 DIAGNOSIS — Z8739 Personal history of other diseases of the musculoskeletal system and connective tissue: Secondary | ICD-10-CM | POA: Diagnosis not present

## 2022-03-27 DIAGNOSIS — R221 Localized swelling, mass and lump, neck: Secondary | ICD-10-CM | POA: Diagnosis not present

## 2022-04-17 ENCOUNTER — Ambulatory Visit: Payer: Medicare HMO | Admitting: Internal Medicine

## 2022-04-19 ENCOUNTER — Inpatient Hospital Stay (HOSPITAL_COMMUNITY): Payer: Medicare HMO | Attending: Hematology

## 2022-04-19 ENCOUNTER — Ambulatory Visit (HOSPITAL_COMMUNITY)
Admission: RE | Admit: 2022-04-19 | Discharge: 2022-04-19 | Disposition: A | Discharge status: Home / Self Care | Payer: Medicare HMO | Source: Ambulatory Visit | Attending: Hematology | Admitting: Hematology

## 2022-04-19 DIAGNOSIS — Z8572 Personal history of non-Hodgkin lymphomas: Secondary | ICD-10-CM | POA: Diagnosis not present

## 2022-04-19 DIAGNOSIS — C8238 Follicular lymphoma grade IIIa, lymph nodes of multiple sites: Secondary | ICD-10-CM | POA: Diagnosis not present

## 2022-04-19 DIAGNOSIS — J849 Interstitial pulmonary disease, unspecified: Secondary | ICD-10-CM | POA: Insufficient documentation

## 2022-04-19 DIAGNOSIS — M4312 Spondylolisthesis, cervical region: Secondary | ICD-10-CM | POA: Diagnosis not present

## 2022-04-19 DIAGNOSIS — C8290 Follicular lymphoma, unspecified, unspecified site: Secondary | ICD-10-CM | POA: Insufficient documentation

## 2022-04-19 DIAGNOSIS — K5730 Diverticulosis of large intestine without perforation or abscess without bleeding: Secondary | ICD-10-CM | POA: Diagnosis not present

## 2022-04-19 DIAGNOSIS — Z79899 Other long term (current) drug therapy: Secondary | ICD-10-CM | POA: Insufficient documentation

## 2022-04-19 DIAGNOSIS — M47812 Spondylosis without myelopathy or radiculopathy, cervical region: Secondary | ICD-10-CM | POA: Diagnosis not present

## 2022-04-19 DIAGNOSIS — I712 Thoracic aortic aneurysm, without rupture, unspecified: Secondary | ICD-10-CM | POA: Diagnosis not present

## 2022-04-19 DIAGNOSIS — J984 Other disorders of lung: Secondary | ICD-10-CM | POA: Diagnosis not present

## 2022-04-19 DIAGNOSIS — C8590 Non-Hodgkin lymphoma, unspecified, unspecified site: Secondary | ICD-10-CM | POA: Diagnosis not present

## 2022-04-19 DIAGNOSIS — J90 Pleural effusion, not elsewhere classified: Secondary | ICD-10-CM | POA: Diagnosis not present

## 2022-04-19 DIAGNOSIS — N281 Cyst of kidney, acquired: Secondary | ICD-10-CM | POA: Diagnosis not present

## 2022-04-19 LAB — CBC WITH DIFFERENTIAL/PLATELET
Abs Immature Granulocytes: 0.03 10*3/uL (ref 0.00–0.07)
Basophils Absolute: 0.1 10*3/uL (ref 0.0–0.1)
Basophils Relative: 1 %
Eosinophils Absolute: 0.2 10*3/uL (ref 0.0–0.5)
Eosinophils Relative: 2 %
HCT: 42.8 % (ref 36.0–46.0)
Hemoglobin: 13.8 g/dL (ref 12.0–15.0)
Immature Granulocytes: 0 %
Lymphocytes Relative: 28 %
Lymphs Abs: 2.5 10*3/uL (ref 0.7–4.0)
MCH: 29.1 pg (ref 26.0–34.0)
MCHC: 32.2 g/dL (ref 30.0–36.0)
MCV: 90.3 fL (ref 80.0–100.0)
Monocytes Absolute: 0.7 10*3/uL (ref 0.1–1.0)
Monocytes Relative: 8 %
Neutro Abs: 5.4 10*3/uL (ref 1.7–7.7)
Neutrophils Relative %: 61 %
Platelets: 155 10*3/uL (ref 150–400)
RBC: 4.74 MIL/uL (ref 3.87–5.11)
RDW: 13.8 % (ref 11.5–15.5)
WBC: 8.8 10*3/uL (ref 4.0–10.5)
nRBC: 0 % (ref 0.0–0.2)

## 2022-04-19 LAB — COMPREHENSIVE METABOLIC PANEL
ALT: 18 U/L (ref 0–44)
AST: 22 U/L (ref 15–41)
Albumin: 4.1 g/dL (ref 3.5–5.0)
Alkaline Phosphatase: 69 U/L (ref 38–126)
Anion gap: 10 (ref 5–15)
BUN: 17 mg/dL (ref 8–23)
CO2: 31 mmol/L (ref 22–32)
Calcium: 9.6 mg/dL (ref 8.9–10.3)
Chloride: 99 mmol/L (ref 98–111)
Creatinine, Ser: 0.87 mg/dL (ref 0.44–1.00)
GFR, Estimated: >60 mL/min (ref >60)
Glucose, Bld: 103 mg/dL — ABNORMAL HIGH (ref 70–99)
Potassium: 3.9 mmol/L (ref 3.5–5.1)
Sodium: 140 mmol/L (ref 135–145)
Total Bilirubin: 0.7 mg/dL (ref 0.3–1.2)
Total Protein: 6.7 g/dL (ref 6.5–8.1)

## 2022-04-19 LAB — LACTATE DEHYDROGENASE: LDH: 157 U/L (ref 98–192)

## 2022-04-19 MED ORDER — IOHEXOL 300 MG/ML  SOLN
150.0000 mL | Freq: Once | INTRAMUSCULAR | Status: AC | PRN
  Administered 2022-04-19: 150 mL via INTRAVENOUS

## 2022-04-20 DIAGNOSIS — L989 Disorder of the skin and subcutaneous tissue, unspecified: Secondary | ICD-10-CM | POA: Diagnosis not present

## 2022-04-26 ENCOUNTER — Inpatient Hospital Stay (HOSPITAL_BASED_OUTPATIENT_CLINIC_OR_DEPARTMENT_OTHER): Payer: Medicare HMO | Admitting: Hematology

## 2022-04-26 VITALS — BP 126/62 | HR 63 | Temp 98.6°F | Resp 18 | Ht 66.0 in | Wt 132.6 lb

## 2022-04-26 DIAGNOSIS — J849 Interstitial pulmonary disease, unspecified: Secondary | ICD-10-CM | POA: Diagnosis not present

## 2022-04-26 DIAGNOSIS — C8290 Follicular lymphoma, unspecified, unspecified site: Secondary | ICD-10-CM | POA: Diagnosis not present

## 2022-04-26 DIAGNOSIS — C8238 Follicular lymphoma grade IIIa, lymph nodes of multiple sites: Secondary | ICD-10-CM | POA: Diagnosis not present

## 2022-04-26 DIAGNOSIS — Z79899 Other long term (current) drug therapy: Secondary | ICD-10-CM | POA: Diagnosis not present

## 2022-04-26 NOTE — Patient Instructions (Addendum)
[place] at [facility] Discharge Instructions   You were seen and examined today by Dr. Delton Coombes.  He reviewed the results of your lab work which are normal.  CT scan of the neck, chest, abdomen and pelvis are also normal.   Return as scheduled in 6 months.    Thank you for choosing [place] at [facility] to provide your oncology and hematology care.  To afford each patient quality time with our provider, please arrive at least 15 minutes before your scheduled appointment time.   If you have a lab appointment with the [facility] please come in thru the Main Entrance and check in at the main information desk.  You need to re-schedule your appointment should you arrive 10 or more minutes late.  We strive to give you quality time with our providers, and arriving late affects you and other patients whose appointments are after yours.  Also, if you no show three or more times for appointments you may be dismissed from the clinic at the providers discretion.     Again, thank you for choosing [facility].  Our hope is that these requests will decrease the amount of time that you wait before being seen by our physicians.       _____________________________________________________________  Should you have questions after your visit to [facility], please contact our office at [phone] and follow the prompts.  Our office hours are 8:00 a.m. and 4:30 p.m. Monday - Friday.  Please note that voicemails left after 4:00 p.m. may not be returned until the following business day.  We are closed weekends and major holidays.  You do have access to a nurse 24-7, just call the main number to the clinic [phone] and do not press any options, hold on the line and a nurse will answer the phone.    For prescription refill requests, have your pharmacy contact our office and allow 72 hours.    Due to Covid, you will need to  wear a mask upon entering the hospital. If you do not have a mask, a mask will be given to you at the Main Entrance upon arrival. For doctor visits, patients may have 1 support person age 82 or older with them. For treatment visits, patients can not have anyone with them due to social distancing guidelines and our immunocompromised population.

## 2022-04-26 NOTE — Progress Notes (Signed)
[facility] [address], [place] [address]   CLINIC:  Medical Oncology/Hematology  PCP:  Celene Squibb, MD [address] [name] [address] [phone]   REASON FOR VISIT:  Follow-up for high-grade follicular lymphoma  PRIOR THERAPY:  1. R-CHOP x 4 cycles from 12/28/2017 to 02/28/2018. 2. Maintenance rituximab from 05/01/2018 to 03/05/2020.  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  INTERVAL HISTORY:  Alicia [name], a 81 y.o. female, returns for routine follow-up of her high-grade follicular lymphoma. Alicia [name] was last seen on 03/08/2022.   Today she reports feeling good. She denies recent new palpable masses, fevers, and infections. Her appetite is good, and she denies nausea and vomiting. She reports SOB while standing and with exertion.   REVIEW OF SYSTEMS:  Review of Systems  Constitutional:  Negative for appetite change, fatigue and fever.  HENT:   Positive for trouble swallowing. Negative for lump/mass.   Respiratory:  Positive for shortness of breath (with exertion).   Gastrointestinal:  Negative for nausea and vomiting.  Neurological:  Positive for numbness.  Psychiatric/Behavioral:  Positive for sleep disturbance.   All other systems reviewed and are negative.   PAST MEDICAL/SURGICAL HISTORY:  Past Medical History:  Diagnosis Date   Arthritis    back and hips   Dementia ([place])    mild per patient   Dyspnea    with exertion   GERD (gastroesophageal reflux disease)    TUMS    Hypercholesterolemia    Hypertension    Non Hodgkin's lymphoma ([place])    Past Surgical History:  Procedure Laterality Date   LYMPH NODE BIOPSY Left 11/27/2017   Procedure: LEFT INGUINAL LYMPH NODE BIOPSY;  Surgeon: Stark Klein, MD;  Location: [facility];  Service: General;  Laterality: Left;   MRI  01/19/2021   with anesthesia at [place] Left 04/14/2021   Procedure: PORT REMOVAL;  Surgeon: Stark Klein, MD;  Location: [facility];  Service: General;  Laterality: Left;   PORTACATH PLACEMENT Left 12/12/2017   Procedure: INSERTION PORT-A-CATH;  Surgeon: Stark Klein, MD;  Location: [facility];  Service: General;  Laterality: Left;   TONSILLECTOMY      SOCIAL HISTORY:  Social History   Socioeconomic History   Marital status: Widowed    Spouse name: Not on file   Number of children: Not on file   Years of education: Not on file   Highest education level: Not on file  Occupational History   Not on file  Tobacco Use   Smoking status: Never   Smokeless tobacco: Never  Vaping Use   Vaping Use: Never used  Substance and Sexual Activity   Alcohol use: No   Drug use: No   Sexual activity: Not Currently    Birth control/protection: Post-menopausal  Other Topics Concern   Not on file  Social History Narrative   Not on file   Social Determinants of Health   Financial Resource Strain: Low Risk  (03/31/2021)  Overall Financial Resource Strain (CARDIA)    Difficulty of Paying Living Expenses: Not hard at all  Food Insecurity: No Food Insecurity (03/31/2021)   Hunger Vital Sign    Worried About Running Out of Food in the Last Year: Never true    Ran Out of Food in the Last Year: Never true  Transportation Needs: No Transportation Needs (03/31/2021)   PRAPARE - Hydrologist (Medical): No    Lack of Transportation (Non-Medical): No  Physical Activity: Inactive (03/31/2021)   Exercise Vital Sign    Days of Exercise per  Week: 0 days    Minutes of Exercise per Session: 0 min  Stress: No Stress Concern Present (03/31/2021)   Comfort    Feeling of Stress : Only a little  Social Connections: Moderately Isolated (03/31/2021)   Social Connection and Isolation Panel [NHANES]    Frequency of Communication with Friends and Family: More than three times a week    Frequency of Social Gatherings with Friends and Family: Never    Attends Religious Services: More than 4 times per year    Active Member of [employer] or Organizations: No    Attends Archivist Meetings: Never    Marital Status: Widowed  Intimate Partner Violence: Not At Risk (03/31/2021)   Humiliation, Afraid, Rape, and Kick questionnaire    Fear of Current or Ex-Partner: No    Emotionally Abused: No    Physically Abused: No    Sexually Abused: No    FAMILY HISTORY:  Family History  Problem Relation Age of Onset   Lung cancer Mother    Kidney cancer Father    Alcoholism Brother    Emphysema Maternal Aunt    Cancer Maternal Uncle    Cancer Maternal Uncle     CURRENT MEDICATIONS:  Current Outpatient Medications  Medication Sig Dispense Refill   acetaminophen (TYLENOL) 500 MG tablet Take 500 mg by mouth at bedtime.     bisoprolol-hydrochlorothiazide (ZIAC) 5-6.25 MG tablet Take 2 tablets by mouth daily.     diphenhydramine-acetaminophen (TYLENOL PM) 25-500 MG TABS tablet Take 0.5 tablets by mouth at bedtime.     famotidine (PEPCID) 20 MG tablet One after supper 30 tablet 11   Multiple Vitamins-Minerals (PRESERVISION AREDS) CAPS Take 1 capsule by mouth daily.     naproxen sodium (ALEVE) 220 MG tablet Take 220 mg by mouth daily as needed (Pain).     Omega-3 Fatty Acids (OMEGA 3 PO) Take 1,000 mg by mouth daily.     pantoprazole (PROTONIX) 40 MG tablet Take 1 tablet (40 mg total) by mouth daily. Take 30-60 min before first meal of the day 30 tablet 2   simvastatin (ZOCOR) 20  MG tablet Take 20 mg by mouth at bedtime.     No current facility-administered medications for this visit.    ALLERGIES:  No Known Allergies  PHYSICAL EXAM:  Performance status (ECOG): 1 - Symptomatic but completely ambulatory  There were no vitals filed for this visit. Wt Readings from Last 3 Encounters:  03/08/22 134 lb 14.7 oz (61.2 kg)  03/02/22 138 lb (62.6 kg)  10/26/21 134 lb 14.7 oz (61.2 kg)   Physical Exam Vitals reviewed.  Constitutional:      Appearance: Normal appearance.  Cardiovascular:     Rate and Rhythm: Normal rate and regular rhythm.     Pulses: Normal pulses.     Heart sounds: Normal heart sounds.  Pulmonary:     Effort: Pulmonary effort is normal.     Breath sounds: Normal breath sounds.  Abdominal:     Palpations: Abdomen is soft. There is no hepatomegaly, splenomegaly or mass.     Tenderness: There is no abdominal tenderness.  Lymphadenopathy:     Cervical: No cervical adenopathy.     Right cervical: No superficial, deep or posterior cervical adenopathy.    Left cervical: No superficial, deep or posterior cervical adenopathy.     Upper Body:     Right upper body: No supraclavicular or axillary adenopathy.     Left upper body: No supraclavicular or axillary adenopathy.     Lower Body: No right inguinal adenopathy. No left inguinal adenopathy.  Neurological:     General: No focal deficit present.     Mental Status: She is alert and oriented to person, place, and time.  Psychiatric:        Mood and Affect: Mood normal.        Behavior: Behavior normal.      LABORATORY DATA:  I have reviewed the labs as listed.     Latest Ref Rng & Units 04/19/2022   10:06 AM 03/08/2022    8:21 AM 10/19/2021   10:28 AM  CBC  WBC 4.0 - 10.5 K/uL 8.8  6.7  8.3   Hemoglobin 12.0 - 15.0 g/dL 13.8  13.6  13.4   Hematocrit 36.0 - 46.0 % 42.8  42.0  41.0   Platelets 150 - 400 K/uL 155  158  169       Latest Ref Rng & Units 04/19/2022   10:06 AM 03/08/2022    8:21  AM 03/02/2022    9:19 AM  CMP  Glucose 70 - 99 mg/dL 103  88  120   BUN 8 - 23 mg/dL '17  21  16   '$ Creatinine 0.44 - 1.00 mg/dL 0.87  1.15  0.90   Sodium 135 - 145 mmol/L 140  139  145   Potassium 3.5 - 5.1 mmol/L 3.9  3.5  3.8   Chloride 98 - 111 mmol/L 99  104  104   CO2 22 - 32 mmol/L '31  29  27   '$ Calcium 8.9 - 10.3 mg/dL 9.6  9.4  9.7   Total Protein 6.5 - 8.1 g/dL 6.7  6.4    Total Bilirubin 0.3 - 1.2 mg/dL 0.7  1.0    Alkaline Phos 38 - 126 U/L 69  66    AST 15 - 41 U/L 22  22    ALT 0 - 44 U/L 18  17      DIAGNOSTIC IMAGING:  I have independently reviewed the scans and discussed with the patient. CT SOFT TISSUE NECK W CONTRAST  Result Date: 04/19/2022 CLINICAL DATA:  Lymphoma. EXAM: CT NECK WITH CONTRAST TECHNIQUE: Multidetector CT imaging of the neck was performed using the standard protocol following the bolus administration of intravenous contrast. RADIATION DOSE REDUCTION: This exam was performed according to the departmental dose-optimization program which includes automated exposure control, adjustment of the mA and/or kV according to patient size and/or use of iterative reconstruction technique. CONTRAST:  137m OMNIPAQUE IOHEXOL 300 MG/ML  SOLN COMPARISON:  Neck ultrasound 03/27/2022 FINDINGS: Pharynx and larynx: No evidence of a mass or swelling within limitations of dental streak artifact. Patent airway. No fluid collection or inflammatory changes in the parapharyngeal or retropharyngeal spaces. Salivary glands: No inflammation, mass, or stone. Thyroid: Subcentimeter thyroid nodules for which no follow-up  imaging is recommended. Lymph nodes: No enlarged or suspicious lymph nodes in the neck. Vascular: Major vascular structures of the neck are patent. Limited intracranial: Unremarkable. Visualized orbits: Bilateral cataract extraction. Mastoids and visualized paranasal sinuses: Small volume fluid in the left sphenoid sinus. Clear mastoid air cells. Skeleton: No suspicious osseous  lesion. Severe right facet arthrosis at C3-4 with trace anterolisthesis. Moderate lower cervical disc degeneration. Upper chest: Reported separately. Other: None. IMPRESSION: No evidence of recurrent lymphoma in the neck. Electronically Signed   By: Logan Bores M.D.   On: 04/19/2022 14:48   CT CHEST ABDOMEN PELVIS W CONTRAST  Result Date: 04/19/2022 CLINICAL DATA:  History of follicular lymphoma, surveillance/restaging. * Tracking Code: BO * EXAM: CT CHEST, ABDOMEN, AND PELVIS WITH CONTRAST TECHNIQUE: Multidetector CT imaging of the chest, abdomen and pelvis was performed following the standard protocol during bolus administration of intravenous contrast. RADIATION DOSE REDUCTION: This exam was performed according to the departmental dose-optimization program which includes automated exposure control, adjustment of the mA and/or kV according to patient size and/or use of iterative reconstruction technique. CONTRAST:  126m OMNIPAQUE IOHEXOL 300 MG/ML  SOLN COMPARISON:  Multiple priors including most recent CT Mar 10, 2021. FINDINGS: CT CHEST FINDINGS Cardiovascular: Interval removal of the left chest Port-A-Cath. Aortic atherosclerosis. Stable tortuous thoracic aorta with mild aneurysmal dilation of the posterior aspect of the aortic arch measuring up to 3.4 cm previously 3.3 cm. No central pulmonary embolus on this nondedicated study. Normal size heart. No significant pericardial effusion/thickening. Mediastinum/Nodes: No suspicious thyroid nodule. No pathologically enlarged mediastinal, hilar or axillary lymph nodes. The esophagus is grossly unremarkable. Lungs/Pleura: Stable scattered sub 4 mm pulmonary nodules. No new suspicious pulmonary nodules or masses. Similar biapical pleuroparenchymal scarring. Similar scarring in the bilateral lung bases. Pleural effusion. No pneumothorax. Musculoskeletal: No aggressive lytic or blastic lesion of bone. Multilevel degenerative change of the spine. Degenerative changes  of the bilateral shoulders. CT ABDOMEN PELVIS FINDINGS Hepatobiliary: No suspicious hepatic lesion. Gallbladder is unremarkable. No biliary ductal dilation. Pancreas: No pancreatic ductal dilation or evidence of acute inflammation. Spleen: No splenomegaly or focal splenic lesion. Adrenals/Urinary Tract: Bilateral adrenal glands appear normal. Similar bilateral renal sinus cysts and left renal cortical cysts which are considered benign and require no independent imaging follow-up. No hydronephrosis. Urinary bladder is unremarkable. Stomach/Bowel: Radiopaque enteric contrast material traverses the descending colon. Stomach is unremarkable for degree of distension. No pathologic dilation of small or large bowel. The appendix and terminal ileum appear normal. Moderate volume of formed stool throughout the colon suggestive of constipation. Left-sided colonic diverticulosis without findings of acute diverticulitis. Vascular/Lymphatic: Aortic atherosclerosis without abdominal aortic aneurysm. No pathologically enlarged abdominal or pelvic lymph nodes. Reproductive: Uterus and bilateral adnexa are within normal limits. Other: No significant abdominopelvic free fluid. Left inguinal surgical clips. Musculoskeletal: No aggressive lytic or blastic lesion of bone. Mild levoconvex curvature of the lumbar spine. Multilevel degenerative changes spine. Degenerative changes bilateral hips and SI joints. IMPRESSION: 1. No lymphadenopathy above or below the diaphragm to suggest recurrent lymphoma. 2. 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 Recommend continued surveillance on oncologic follow-up imaging in this patient otherwise recommend annual imaging followup by CTA or MRA. This recommendation follows 2010 ACCF/AHA/AATS/ACR/ASA/SCA/SCAI/SIR/STS/SVM Guidelines for the Diagnosis and Management of Patients with Thoracic Aortic Disease. Circulation.2010; 121:: T016-W109 Aortic aneurysm NOS  (ICD10-I71.9) 3. Colonic diverticulosis without findings of acute diverticulitis. 4.  Aortic Atherosclerosis (ICD10-I70.0). Electronically Signed   By: JAndree MoroD.  On: 04/19/2022 13:38   US SOFT TISSUE HEAD & NECK (NON-THYROID)  Result Date: 03/27/2022 CLINICAL DATA:  81 year old female with swelling EXAM: ULTRASOUND OF HEAD/NECK SOFT TISSUES TECHNIQUE: Ultrasound examination of the head and neck soft tissues was performed in the area of clinical concern. COMPARISON:  None Available. FINDINGS: Grayscale and color duplex performed in the region of clinical concern. No focal fluid.  No adenopathy.  No soft tissue lesion. IMPRESSION: Unremarkable sonographic survey in the region of clinical concern Electronically Signed   By: Corrie Mckusick D.O.   On: 03/27/2022 16:42     ASSESSMENT:  1.  High-grade follicular lymphoma: -Presentation with left iliac lymph node enlargement, no B symptoms, initial node biopsy on 10/19/2016 consistent with non-Hodgkin's lymphoma. -Excisional biopsy on 11/27/2017 shows high-grade follicular lymphoma (grade 3A). -4 cycles of R-CHOP from 12/28/2017 through 02/28/2018.  She had difficulty breathing after second and third treatments. -Maintenance rituximab from 05/01/2018 through 03/05/2020. -CT CAP on 06/29/2020 shows resolution of right middle lobe pulmonary nodule.  No abdominal or pelvic adenopathy.  No findings of lymphoma.   2.  Interstitial lung disease: -Evaluated by pulmonary and was thought to have interstitial lung disease. -Mild dyspnea on exertion is stable. -Recent CT chest on 12/31/2019 showed nonspecific lung nodules measuring up to 4 mm.  New small nodule in the right middle lobe measuring 3 mm. -CT on 06/29/2020 with resolution of right middle lobe lung nodule.   PLAN:  1.  High-grade follicular lymphoma: - She does not report any B symptoms.  No infections in the last 6 months. - Physical exam today did not reveal any palpable adenopathy. - CT neck,  CAP on 04/19/2022 with no lymphadenopathy.  Stable mild aneurysmal dilatation of distal aortic arch. - Labs reviewed were normal. - RTC 6 months with repeat labs.  Imaging in 1 year.     2.  Interstitial lung disease: - She reports dyspnea on exertion which is stable.  Continue follow-up with pulmonary.   3.  Elevated creatinine: - Creatinine today 0.87.  This has normalized.   Orders placed this encounter:  No orders of the defined types were placed in this encounter.    Derek Jack, MD [place] [phone]   I, Thana Ates, am acting as a scribe for Dr. Derek Jack.  I, Derek Jack MD, have reviewed the above documentation for accuracy and completeness, and I agree with the above.

## 2022-04-29 ENCOUNTER — Emergency Department (HOSPITAL_COMMUNITY)
Admission: EM | Admit: 2022-04-29 | Discharge: 2022-04-29 | Disposition: A | Discharge status: Home / Self Care | Payer: Medicare HMO | Attending: Emergency Medicine | Admitting: Emergency Medicine

## 2022-04-29 ENCOUNTER — Emergency Department (HOSPITAL_COMMUNITY): Payer: Medicare HMO

## 2022-04-29 ENCOUNTER — Encounter (HOSPITAL_COMMUNITY): Payer: Self-pay | Admitting: Emergency Medicine

## 2022-04-29 ENCOUNTER — Other Ambulatory Visit: Payer: Self-pay

## 2022-04-29 DIAGNOSIS — I10 Essential (primary) hypertension: Secondary | ICD-10-CM | POA: Insufficient documentation

## 2022-04-29 DIAGNOSIS — F419 Anxiety disorder, unspecified: Secondary | ICD-10-CM | POA: Diagnosis not present

## 2022-04-29 DIAGNOSIS — R69 Illness, unspecified: Secondary | ICD-10-CM | POA: Diagnosis not present

## 2022-04-29 DIAGNOSIS — C8590 Non-Hodgkin lymphoma, unspecified, unspecified site: Secondary | ICD-10-CM | POA: Diagnosis not present

## 2022-04-29 DIAGNOSIS — R0600 Dyspnea, unspecified: Secondary | ICD-10-CM

## 2022-04-29 DIAGNOSIS — R0602 Shortness of breath: Secondary | ICD-10-CM | POA: Diagnosis not present

## 2022-04-29 DIAGNOSIS — F0394 Unspecified dementia, unspecified severity, with anxiety: Secondary | ICD-10-CM | POA: Diagnosis not present

## 2022-04-29 DIAGNOSIS — J849 Interstitial pulmonary disease, unspecified: Secondary | ICD-10-CM | POA: Diagnosis not present

## 2022-04-29 LAB — COMPREHENSIVE METABOLIC PANEL
ALT: 22 U/L (ref 0–44)
AST: 39 U/L (ref 15–41)
Albumin: 4.2 g/dL (ref 3.5–5.0)
Alkaline Phosphatase: 69 U/L (ref 38–126)
Anion gap: 13 (ref 5–15)
BUN: 17 mg/dL (ref 8–23)
CO2: 22 mmol/L (ref 22–32)
Calcium: 9.8 mg/dL (ref 8.9–10.3)
Chloride: 107 mmol/L (ref 98–111)
Creatinine, Ser: 1.23 mg/dL — ABNORMAL HIGH (ref 0.44–1.00)
GFR, Estimated: 44 mL/min — ABNORMAL LOW (ref >60)
Glucose, Bld: 173 mg/dL — ABNORMAL HIGH (ref 70–99)
Potassium: 3.6 mmol/L (ref 3.5–5.1)
Sodium: 142 mmol/L (ref 135–145)
Total Bilirubin: 1.1 mg/dL (ref 0.3–1.2)
Total Protein: 6.9 g/dL (ref 6.5–8.1)

## 2022-04-29 LAB — CBC WITH DIFFERENTIAL/PLATELET
Abs Immature Granulocytes: 0.03 10*3/uL (ref 0.00–0.07)
Basophils Absolute: 0.1 10*3/uL (ref 0.0–0.1)
Basophils Relative: 1 %
Eosinophils Absolute: 0.1 10*3/uL (ref 0.0–0.5)
Eosinophils Relative: 1 %
HCT: 41.7 % (ref 36.0–46.0)
Hemoglobin: 14 g/dL (ref 12.0–15.0)
Immature Granulocytes: 0 %
Lymphocytes Relative: 41 %
Lymphs Abs: 4.2 10*3/uL — ABNORMAL HIGH (ref 0.7–4.0)
MCH: 29.5 pg (ref 26.0–34.0)
MCHC: 33.6 g/dL (ref 30.0–36.0)
MCV: 87.8 fL (ref 80.0–100.0)
Monocytes Absolute: 0.8 10*3/uL (ref 0.1–1.0)
Monocytes Relative: 8 %
Neutro Abs: 5 10*3/uL (ref 1.7–7.7)
Neutrophils Relative %: 49 %
Platelets: 179 10*3/uL (ref 150–400)
RBC: 4.75 MIL/uL (ref 3.87–5.11)
RDW: 14.2 % (ref 11.5–15.5)
WBC: 10.2 10*3/uL (ref 4.0–10.5)
nRBC: 0 % (ref 0.0–0.2)

## 2022-04-29 LAB — BRAIN NATRIURETIC PEPTIDE: B Natriuretic Peptide: 217 pg/mL — ABNORMAL HIGH (ref 0.0–100.0)

## 2022-04-29 LAB — D-DIMER, QUANTITATIVE: D-Dimer, Quant: 0.45 ug/mL-FEU (ref 0.00–0.50)

## 2022-04-29 NOTE — ED Triage Notes (Signed)
Patient c/o sudden shortness of breath with tremors that started approx 15 minutes prior to coming to ED. Denies any chest pain, coughing, or fevers. Denies any cardiac hx or COPD. Per patient has had intermittent problems with breathing since receiving chemo for non-hodgkins lymphoma 2 years ago and was only able to tolerate 4 treatments at a time.

## 2022-04-29 NOTE — ED Provider Notes (Signed)
[facility] EMERGENCY DEPARTMENT Provider Note   CSN: [id number] Arrival date & time: 04/29/22  0950     History  Chief Complaint  Patient presents with   Shortness of Breath    Alicia [name] is a 81 y.o. female with a history of HTN, Grade 3a follicular lymphoma, hypercholesterolemia, GERD, ILD, and dementia.  Presenting today with sudden onset shortness of breath and tremors started less than an hour ago.  Since receiving chemotherapy 2 years ago for her non-Hodgkin's lymphoma, has intermittent DOE, about several times a day.  Usually relieves with 5 to 10 minutes of rest.  Today had rested 30 minutes, but noticed no improvement.  Also started noticing tingling sensation in her fingers and toes which concerned her.  Called her daughter to take her to the ED.  Denies chest pain, dizziness, lightheadedness, fevers, chills, upper respiratory symptoms, urinary symptoms, changes in bowel habits, headache, vision changes, or new weakness.  No Hx of asthma or COPD.  Lives by herself.  Accompanied by her daughter.  The history is provided by the patient and medical records.  Shortness of Breath      Home Medications Prior to Admission medications   Medication Sig Start Date End Date Taking? Authorizing Provider  acetaminophen (TYLENOL) 500 MG tablet Take 500 mg by mouth at bedtime.    [provider]  bisoprolol-hydrochlorothiazide (ZIAC) 5-6.25 MG tablet Take 2 tablets by mouth daily.    [provider]  diphenhydramine-acetaminophen (TYLENOL PM) 25-500 MG TABS tablet Take 0.5 tablets by mouth at bedtime.    [provider]  Multiple Vitamins-Minerals (PRESERVISION AREDS) CAPS Take 1 capsule by mouth daily.    [provider]  naproxen sodium (ALEVE) 220 MG tablet Take 220 mg by mouth daily as needed (Pain).    [provider]  Omega-3 Fatty Acids (OMEGA 3 PO) Take 1,000 mg by mouth daily.    [provider]  simvastatin (ZOCOR) 20  MG tablet Take 20 mg by mouth at bedtime. 02/08/21   [provider]      Allergies    Patient has no known allergies.    Review of Systems   Review of Systems  Respiratory:  Positive for shortness of breath.     Physical Exam Updated Vital Signs BP 102/69   Pulse (!) 56   Temp 97.8 F (36.6 C) (Oral)   Resp 15   Ht '5\' 6"'$  (1.676 m)   Wt 60.8 kg   SpO2 98%   BMI 21.63 kg/m  Physical Exam Vitals and nursing note reviewed.  Constitutional:      General: She is not in acute distress.    Appearance: She is well-developed. She is not ill-appearing, toxic-appearing or diaphoretic.  HENT:     Head: Normocephalic and atraumatic.  Eyes:     Conjunctiva/sclera: Conjunctivae normal.  Cardiovascular:     Rate and Rhythm: Normal rate and regular rhythm.     Pulses: Normal pulses.          Radial pulses are 2+ on the right side and 2+ on the left side.       Dorsalis pedis pulses are 2+ on the right side and 2+ on the left side.       Posterior tibial pulses are 2+ on the right side and 2+ on the left side.     Heart sounds: No murmur heard.    Comments: CRT less than 2 seconds.  RRR without M/R/G Pulmonary:  Effort: Pulmonary effort is normal. Tachypnea present. No accessory muscle usage or respiratory distress.     Breath sounds: Normal breath sounds. No stridor. No decreased breath sounds, wheezing or rhonchi.     Comments: Initially hyperventilating at 40 respirations per minute, by the end of exam breathing at 22 respirations per minute and appears less anxious. Abdominal:     Palpations: Abdomen is soft.     Tenderness: There is no abdominal tenderness.  Musculoskeletal:        General: No swelling.     Cervical back: Neck supple.     Right lower leg: No edema.     Left lower leg: No edema.  Skin:    General: Skin is warm and dry.     Capillary Refill: Capillary refill takes less than 2 seconds.     Coloration: Skin is not cyanotic or pale.     Findings: No  erythema.  Neurological:     Mental Status: She is alert and oriented to person, place, and time.     GCS: GCS eye subscore is 4. GCS verbal subscore is 5. GCS motor subscore is 6.     Cranial Nerves: No dysarthria or facial asymmetry.     Sensory: Sensation is intact.     Motor: No weakness or tremor.     Coordination: Coordination normal.  Psychiatric:        Mood and Affect: Mood is anxious.     ED Results / Procedures / Treatments   Labs (all labs ordered are listed, but only abnormal results are displayed) Labs Reviewed  COMPREHENSIVE METABOLIC PANEL - Abnormal; Notable for the following components:      Result Value   Glucose, Bld 173 (*)    Creatinine, Ser 1.23 (*)    GFR, Estimated 44 (*)    All other components within normal limits  CBC WITH DIFFERENTIAL/PLATELET - Abnormal; Notable for the following components:   Lymphs Abs 4.2 (*)    All other components within normal limits  BRAIN NATRIURETIC PEPTIDE - Abnormal; Notable for the following components:   B Natriuretic Peptide 217.0 (*)    All other components within normal limits  D-DIMER, QUANTITATIVE    EKG None  Radiology DG Chest Port 1 View  Result Date: 04/29/2022 CLINICAL DATA:  81 year old female with shortness of breath. Tremor. Lymphoma. EXAM: PORTABLE CHEST 1 VIEW COMPARISON:  Chest radiographs 03/02/2022 and earlier. CT Chest, Abdomen, and Pelvis 04/19/2022. FINDINGS: Portable AP semi upright view at 1021 hours. Lung volumes are stable at the upper limits of normal. Mildly tortuous thoracic aorta. Other mediastinal contours are within normal limits. Visualized tracheal air column is within normal limits. Allowing for portable technique the lungs are clear. No pneumothorax or pleural effusion. No acute osseous abnormality identified. Negative visible bowel gas. IMPRESSION: No acute cardiopulmonary abnormality. Electronically Signed   By: Genevie Ann M.D.   On: 04/29/2022 11:07    Procedures Procedures     Medications Ordered in ED Medications - No data to display  ED Course/ Medical Decision Making/ A&P                           Medical Decision Making Amount and/or Complexity of Data Reviewed Labs: ordered. Radiology: ordered. ECG/medicine tests: ordered.   81 y.o. female presents to the ED for concern of Shortness of Breath     This involves an extensive number of treatment options, and is a complaint that  carries with it a high risk of complications and morbidity.  The emergent differential diagnosis prior to evaluation includes, but is not limited to: Recurrent dyspnea on exertion, anxiousness, pneumonia, pneumothorax, pulmonary embolism, ACS  This is not an exhaustive differential.   Past Medical History / Co-morbidities / Social History: Hx of HTN, Grade 3a follicular lymphoma, hypercholesterolemia, GERD, ILD, and dementia Social Determinants of Health include elderly  Additional History:  Internal and external records from outside source obtained and reviewed including oncology follow-up from 7/12 due to her high-grade follicular lymphoma, patient was feeling well and hemodynamically stable  Lab Tests: I ordered, and personally interpreted labs.  The pertinent results include:   CBC: No leukocytosis CMP/BMP: Creatinine 1.23, increased from 0.87 of 10 days ago BNP: 217, mildly elevated but close to recent values over the last few years, appears nonspecific D-Dimer: 0.45  Imaging Studies: I ordered imaging studies including CXR.   I independently visualized and interpreted imaging which showed no evidence of pneumothorax or pneumonia I agree with the radiologist interpretation.  Cardiac Monitoring: The patient was maintained on a cardiac monitor.  I personally viewed and interpreted the cardiac monitored which showed an underlying rhythm of: Normal sinus rhythm  ED Course / Critical Interventions: Pt anxious-appearing on exam.  Nonseptic, nontoxic-appearing, in  NAD.  Sitting in bed, mildly tachypneic but afebrile.  Satting at 100%.  Complaining of shortness of breath.  Usually has frequent dyspnea on exertion ever since her chemotherapy 2 years ago for non-Hodgkin's lymphoma.  Usually relieves with rest.  Today was not relieved within 20-30 minutes of rest, began becoming concerned and started having some tingling in her fingertips and toes.  During exam, tachypnea has decreased significantly.  Patient appears much less anxious.  Able to communicate clearly and without difficulty.  Extremities appear neurovascularly intact, strong equal pulses.  Without dizziness or obvious focal neuro deficits, coordination and range of motion generally intact.  CXR negative for pneumothorax, pneumonia, pleural effusions, pulmonary edema, or other acute cardiopulmonary pathology.  PERC score of 1 due to age.  No clinical signs and symptoms of DVT.  D-dimer negative.  Not tachypneic, no hemoptysis, chest pain or pressure, fevers, or back pain.  Wells criteria of 1, low risk of pulmonary embolism.  BNP with mild elevation, but is not far from her baseline over the last few years.  Low clinical and historical correlation for CHF.  EKG unremarkable.  Without chest pain or significant cardiac history.  Low suspicion for ACS at this time.  Does not appear hypoxic. Upon reevaluation, patient has significantly improved with rest.  Patient states she feels much better, is no longer hyperventilating, and would like to go home.  Still satting at 100%, respirations at 13 to 15/min.  Based off of history, imaging and clinical findings, and resolution of symptoms, I believe this is reasonable.  Tingling in fingertips and toes likely due to anxiousness and tachypnea, low suspicion of circulatory compromise.  Recommend close follow-up with pulmonologist within the next 2 to 3 days, as her overall dyspnea episodes appear to be increasing over the last 2 to 3 months.  Patient satisfied with today's  encounter.  In NAD and good condition at time of discharge. I have reviewed the patients home medicines and have made adjustments as needed.  Disposition: Considered admission and after reviewing the patient's encounter today, I feel that the patient would benefit from close outpatient follow-up with PCP/pulmonologist.  Discussed course of treatment with the patient, whom demonstrated  understanding.  Patient in agreement and has no further questions.    I discussed this case with my attending, Dr. Roderic Palau, who agreed with the proposed treatment course and cosigned this note including patient's presenting symptoms, physical exam, and planned diagnostics and interventions.  Attending physician stated agreement with plan or made changes to plan which were implemented.     This chart was dictated using voice recognition software.  Despite best efforts to proofread, errors can occur which can change the documentation meaning.         Final Clinical Impression(s) / ED Diagnoses Final diagnoses:  Dyspnea, unspecified type  Anxiousness    Rx / DC Orders ED Discharge Orders     None         Candace Cruise [birth year] 1246    Milton Ferguson, MD 04/30/22 [phone]

## 2022-04-29 NOTE — Discharge Instructions (Addendum)
Please follow-up with your pulmonologist within the next 2 to 3 days for reevaluation and continued medical management.  Return to the ED for new or worsening symptoms as discussed.

## 2022-05-25 DIAGNOSIS — L820 Inflamed seborrheic keratosis: Secondary | ICD-10-CM | POA: Diagnosis not present

## 2022-07-05 DIAGNOSIS — E782 Mixed hyperlipidemia: Secondary | ICD-10-CM | POA: Diagnosis not present

## 2022-07-05 DIAGNOSIS — R7303 Prediabetes: Secondary | ICD-10-CM | POA: Diagnosis not present

## 2022-07-12 DIAGNOSIS — E782 Mixed hyperlipidemia: Secondary | ICD-10-CM | POA: Diagnosis not present

## 2022-07-12 DIAGNOSIS — I712 Thoracic aortic aneurysm, without rupture, unspecified: Secondary | ICD-10-CM | POA: Insufficient documentation

## 2022-07-12 DIAGNOSIS — I700 Atherosclerosis of aorta: Secondary | ICD-10-CM | POA: Insufficient documentation

## 2022-07-12 DIAGNOSIS — Z0000 Encounter for general adult medical examination without abnormal findings: Secondary | ICD-10-CM | POA: Diagnosis not present

## 2022-07-12 DIAGNOSIS — G3184 Mild cognitive impairment, so stated: Secondary | ICD-10-CM | POA: Diagnosis not present

## 2022-07-12 DIAGNOSIS — R42 Dizziness and giddiness: Secondary | ICD-10-CM | POA: Diagnosis not present

## 2022-07-12 DIAGNOSIS — C8590 Non-Hodgkin lymphoma, unspecified, unspecified site: Secondary | ICD-10-CM | POA: Diagnosis not present

## 2022-07-12 DIAGNOSIS — G4700 Insomnia, unspecified: Secondary | ICD-10-CM | POA: Diagnosis not present

## 2022-07-12 DIAGNOSIS — R7303 Prediabetes: Secondary | ICD-10-CM | POA: Diagnosis not present

## 2022-07-12 DIAGNOSIS — H3530 Unspecified macular degeneration: Secondary | ICD-10-CM | POA: Diagnosis not present

## 2022-07-12 DIAGNOSIS — I10 Essential (primary) hypertension: Secondary | ICD-10-CM | POA: Diagnosis not present

## 2022-07-12 DIAGNOSIS — J849 Interstitial pulmonary disease, unspecified: Secondary | ICD-10-CM | POA: Diagnosis not present

## 2022-07-12 DIAGNOSIS — R911 Solitary pulmonary nodule: Secondary | ICD-10-CM | POA: Diagnosis not present

## 2022-10-23 DIAGNOSIS — M722 Plantar fascial fibromatosis: Secondary | ICD-10-CM | POA: Diagnosis not present

## 2022-10-23 DIAGNOSIS — M79672 Pain in left foot: Secondary | ICD-10-CM | POA: Diagnosis not present

## 2022-10-25 ENCOUNTER — Inpatient Hospital Stay: Payer: Medicare HMO | Attending: Hematology

## 2022-10-25 DIAGNOSIS — J849 Interstitial pulmonary disease, unspecified: Secondary | ICD-10-CM | POA: Insufficient documentation

## 2022-10-25 DIAGNOSIS — C8238 Follicular lymphoma grade IIIa, lymph nodes of multiple sites: Secondary | ICD-10-CM | POA: Insufficient documentation

## 2022-10-25 LAB — CBC WITH DIFFERENTIAL/PLATELET
Abs Immature Granulocytes: 0.04 10*3/uL (ref 0.00–0.07)
Basophils Absolute: 0.1 10*3/uL (ref 0.0–0.1)
Basophils Relative: 1 %
Eosinophils Absolute: 0.1 10*3/uL (ref 0.0–0.5)
Eosinophils Relative: 1 %
HCT: 40.3 % (ref 36.0–46.0)
Hemoglobin: 13.3 g/dL (ref 12.0–15.0)
Immature Granulocytes: 0 %
Lymphocytes Relative: 34 %
Lymphs Abs: 3.2 10*3/uL (ref 0.7–4.0)
MCH: 29.9 pg (ref 26.0–34.0)
MCHC: 33 g/dL (ref 30.0–36.0)
MCV: 90.6 fL (ref 80.0–100.0)
Monocytes Absolute: 0.6 10*3/uL (ref 0.1–1.0)
Monocytes Relative: 6 %
Neutro Abs: 5.2 10*3/uL (ref 1.7–7.7)
Neutrophils Relative %: 58 %
Platelets: 176 10*3/uL (ref 150–400)
RBC: 4.45 MIL/uL (ref 3.87–5.11)
RDW: 14.6 % (ref 11.5–15.5)
WBC: 9.2 10*3/uL (ref 4.0–10.5)
nRBC: 0 % (ref 0.0–0.2)

## 2022-10-25 LAB — LACTATE DEHYDROGENASE: LDH: 142 U/L (ref 98–192)

## 2022-10-25 LAB — COMPREHENSIVE METABOLIC PANEL
ALT: 16 U/L (ref 0–44)
AST: 22 U/L (ref 15–41)
Albumin: 3.8 g/dL (ref 3.5–5.0)
Alkaline Phosphatase: 66 U/L (ref 38–126)
Anion gap: 10 (ref 5–15)
BUN: 17 mg/dL (ref 8–23)
CO2: 28 mmol/L (ref 22–32)
Calcium: 9.2 mg/dL (ref 8.9–10.3)
Chloride: 102 mmol/L (ref 98–111)
Creatinine, Ser: 1.03 mg/dL — ABNORMAL HIGH (ref 0.44–1.00)
GFR, Estimated: 54 mL/min — ABNORMAL LOW (ref >60)
Glucose, Bld: 114 mg/dL — ABNORMAL HIGH (ref 70–99)
Potassium: 3.5 mmol/L (ref 3.5–5.1)
Sodium: 140 mmol/L (ref 135–145)
Total Bilirubin: 0.5 mg/dL (ref 0.3–1.2)
Total Protein: 6.2 g/dL — ABNORMAL LOW (ref 6.5–8.1)

## 2022-11-01 ENCOUNTER — Encounter: Payer: Self-pay | Admitting: Hematology

## 2022-11-01 ENCOUNTER — Inpatient Hospital Stay (HOSPITAL_BASED_OUTPATIENT_CLINIC_OR_DEPARTMENT_OTHER): Payer: Medicare HMO | Admitting: Hematology

## 2022-11-01 VITALS — BP 133/70 | HR 66 | Temp 97.6°F | Resp 18 | Wt 134.5 lb

## 2022-11-01 DIAGNOSIS — C8238 Follicular lymphoma grade IIIa, lymph nodes of multiple sites: Secondary | ICD-10-CM

## 2022-11-01 DIAGNOSIS — J849 Interstitial pulmonary disease, unspecified: Secondary | ICD-10-CM | POA: Diagnosis not present

## 2022-11-01 NOTE — Patient Instructions (Addendum)
[facility]  Discharge Instructions  You were seen and examined today by Dr. Delton Coombes.  Your labs are stable. Dr. Delton Coombes has recommended a repeat scan with labs in 6 months.  Your total protein level is low, please increase your protein intake.  Follow-up as scheduled.  Thank you for choosing [facility] to provide your oncology and hematology care.   To afford each patient quality time with our provider, please arrive at least 15 minutes before your scheduled appointment time. You may need to reschedule your appointment if you arrive late (10 or more minutes). Arriving late affects you and other patients whose appointments are after yours.  Also, if you miss three or more appointments without notifying the office, you may be dismissed from the clinic at the provider's discretion.    Again, thank you for choosing [facility].  Our hope is that these requests will decrease the amount of time that you wait before being seen by our physicians.   If you have a lab appointment with the [facility] please come in thru the Main Entrance and check in at the main information desk.           _____________________________________________________________  Should you have questions after your visit to [facility], please contact our office at [phone] and follow the prompts.  Our office hours are 8:00 a.m. to 4:30 p.m. Monday - Thursday and 8:00 a.m. to 2:30 p.m. Friday.  Please note that voicemails left after 4:00 p.m. may not be returned until the following business day.  We are closed weekends and all major holidays.  You do have access to a nurse 24-7, just call the main number to the clinic [phone] and do not press any options, hold on the line and a nurse will answer the phone.    For prescription refill requests, have your pharmacy contact our office and allow 72 hours.    Masks are optional in the  cancer centers. If you would like for your care team to wear a mask while they are taking care of you, please let them know. You may have one support person who is at least 83 years old accompany you for your appointments.

## 2022-11-01 NOTE — Progress Notes (Signed)
[facility] [address] [place] [address]   CLINIC:  Medical Oncology/Hematology  PCP:  Celene Squibb, MD [address] [name] [address] [phone]   REASON FOR VISIT:  Follow-up for high-grade follicular lymphoma  PRIOR THERAPY:  1. R-CHOP x 4 cycles from 12/28/2017 to 02/28/2018. 2. Maintenance rituximab from 05/01/2018 to 03/05/2020.  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 ([place])  12/20/2017 Initial Diagnosis   Grade 3a follicular lymphoma of lymph nodes of multiple regions ([place])   12/28/2017 - 03/05/2020 Chemotherapy   Patient is on Treatment Plan : Maintainence Rapid Rituxan every 60 days       CANCER STAGING:  Cancer Staging  No matching staging information was found for the patient.  INTERVAL HISTORY:  Alicia [name], a 82 y.o. female, seen for follow-up of high-grade follicular lymphoma.  Energy levels are 100%.  Denies any fevers, night sweats or weight loss in the last 6 months.  Denies any infections or hospitalizations.  REVIEW OF SYSTEMS:  Review of Systems  Constitutional:  Negative for appetite change, fatigue and fever.  HENT:   Negative for lump/mass.   Respiratory:  Positive for shortness of breath (with exertion).   Gastrointestinal:  Negative for nausea and vomiting.  Neurological:  Positive for numbness.  All other systems reviewed and are negative.   PAST MEDICAL/SURGICAL HISTORY:  Past Medical History:  Diagnosis Date   Arthritis    back and hips   Dementia ([place])    mild per patient   Dyspnea    with exertion   GERD (gastroesophageal reflux disease)    TUMS   Hypercholesterolemia    Hypertension    Non Hodgkin's lymphoma ([place])    Past Surgical History:  Procedure Laterality Date   LYMPH NODE BIOPSY Left 11/27/2017   Procedure: LEFT INGUINAL LYMPH NODE BIOPSY;   Surgeon: Stark Klein, MD;  Location: [facility];  Service: General;  Laterality: Left;   MRI  01/19/2021   with anesthesia at [facility] Left 04/14/2021   Procedure: PORT REMOVAL;  Surgeon: Stark Klein, MD;  Location: [facility];  Service: General;  Laterality: Left;   PORTACATH PLACEMENT Left 12/12/2017   Procedure: INSERTION PORT-A-CATH;  Surgeon: Stark Klein, MD;  Location: [facility];  Service: General;  Laterality: Left;   TONSILLECTOMY      SOCIAL HISTORY:  Social History   Socioeconomic History   Marital status: Widowed    Spouse name: Not on file   Number of children: Not on file   Years of education: Not on file   Highest education level: Not on file  Occupational History   Not on file  Tobacco Use   Smoking status: Never   Smokeless tobacco: Never  Vaping Use   Vaping Use: Never used  Substance and Sexual Activity   Alcohol use: No   Drug use: No   Sexual activity: Not Currently    Birth control/protection: Post-menopausal  Other Topics Concern   Not on file  Social History Narrative   Not on file   Social Determinants of Health   Financial Resource Strain: Low Risk  (03/31/2021)   Overall Financial Resource Strain (CARDIA)    Difficulty of Paying Living Expenses: Not hard at all  Food Insecurity: No Food Insecurity (03/31/2021)   Hunger Vital Sign  Worried About Charity fundraiser in the Last Year: Never true    [place] in the Last Year: Never true  Transportation Needs: No Transportation Needs (03/31/2021)   PRAPARE - Hydrologist (Medical): No    Lack of Transportation (Non-Medical): No  Physical Activity: Inactive (03/31/2021)   Exercise Vital Sign    Days of Exercise per Week: 0 days    Minutes of Exercise per Session: 0 min  Stress: No Stress Concern Present (03/31/2021)   Alicia [name]    Feeling of Stress : Only a  little  Social Connections: Moderately Isolated (03/31/2021)   Social Connection and Isolation Panel [NHANES]    Frequency of Communication with Friends and Family: More than three times a week    Frequency of Social Gatherings with Friends and Family: Never    Attends Religious Services: More than 4 times per year    Active Member of [employer] or Organizations: No    Attends Archivist Meetings: Never    Marital Status: Widowed  Intimate Partner Violence: Not At Risk (03/31/2021)   Humiliation, Afraid, Rape, and Kick questionnaire    Fear of Current or Ex-Partner: No    Emotionally Abused: No    Physically Abused: No    Sexually Abused: No    FAMILY HISTORY:  Family History  Problem Relation Age of Onset   Lung cancer Mother    Kidney cancer Father    Alcoholism Brother    Emphysema Maternal Aunt    Cancer Maternal Uncle    Cancer Maternal Uncle     CURRENT MEDICATIONS:  Current Outpatient Medications  Medication Sig Dispense Refill   acetaminophen (TYLENOL) 500 MG tablet Take 250 mg by mouth at bedtime.     bisoprolol-hydrochlorothiazide (ZIAC) 5-6.25 MG tablet Take 2 tablets by mouth daily.     diphenhydramine-acetaminophen (TYLENOL PM) 25-500 MG TABS tablet Take 1 tablet by mouth at bedtime.     Multiple Vitamins-Minerals (PRESERVISION AREDS) CAPS Take 2 capsules by mouth daily.     naproxen sodium (ALEVE) 220 MG tablet Take 220 mg by mouth daily as needed (Pain).     Omega-3 Fatty Acids (OMEGA 3 PO) Take 1,000 mg by mouth daily.     simvastatin (ZOCOR) 20 MG tablet Take 20 mg by mouth at bedtime.     No current facility-administered medications for this visit.    ALLERGIES:  No Known Allergies  PHYSICAL EXAM:  Performance status (ECOG): 1 - Symptomatic but completely ambulatory  Vitals:   11/01/22 0804  BP: 133/70  Pulse: 66  Resp: 18  Temp: 97.6 F (36.4 C)  SpO2: 98%   Wt Readings from Last 3 Encounters:  11/01/22 134 lb 7.7 oz (61 kg)  04/29/22  134 lb (60.8 kg)  04/26/22 132 lb 9.6 oz (60.1 kg)   Physical Exam Vitals reviewed.  Constitutional:      Appearance: Normal appearance.  Cardiovascular:     Rate and Rhythm: Normal rate and regular rhythm.     Pulses: Normal pulses.     Heart sounds: Normal heart sounds.  Pulmonary:     Effort: Pulmonary effort is normal.     Breath sounds: Normal breath sounds.  Abdominal:     Palpations: Abdomen is soft. There is no hepatomegaly, splenomegaly or mass.     Tenderness: There is no abdominal tenderness.  Lymphadenopathy:     Cervical: No cervical adenopathy.  Right cervical: No superficial, deep or posterior cervical adenopathy.    Left cervical: No superficial, deep or posterior cervical adenopathy.     Upper Body:     Right upper body: No supraclavicular or axillary adenopathy.     Left upper body: No supraclavicular or axillary adenopathy.     Lower Body: No right inguinal adenopathy. No left inguinal adenopathy.  Neurological:     General: No focal deficit present.     Mental Status: She is alert and oriented to person, place, and time.  Psychiatric:        Mood and Affect: Mood normal.        Behavior: Behavior normal.     LABORATORY DATA:  I have reviewed the labs as listed.     Latest Ref Rng & Units 10/25/2022   10:17 AM 04/29/2022   10:20 AM 04/19/2022   10:06 AM  CBC  WBC 4.0 - 10.5 K/uL 9.2  10.2  8.8   Hemoglobin 12.0 - 15.0 g/dL 13.3  14.0  13.8   Hematocrit 36.0 - 46.0 % 40.3  41.7  42.8   Platelets 150 - 400 K/uL 176  179  155       Latest Ref Rng & Units 10/25/2022   10:17 AM 04/29/2022   10:20 AM 04/19/2022   10:06 AM  CMP  Glucose 70 - 99 mg/dL 114  173  103   BUN 8 - 23 mg/dL '17  17  17   '$ Creatinine 0.44 - 1.00 mg/dL 1.03  1.23  0.87   Sodium 135 - 145 mmol/L 140  142  140   Potassium 3.5 - 5.1 mmol/L 3.5  3.6  3.9   Chloride 98 - 111 mmol/L 102  107  99   CO2 22 - 32 mmol/L '28  22  31   '$ Calcium 8.9 - 10.3 mg/dL 9.2  9.8  9.6   Total Protein  6.5 - 8.1 g/dL 6.2  6.9  6.7   Total Bilirubin 0.3 - 1.2 mg/dL 0.5  1.1  0.7   Alkaline Phos 38 - 126 U/L 66  69  69   AST 15 - 41 U/L 22  39  22   ALT 0 - 44 U/L '16  22  18     '$ DIAGNOSTIC IMAGING:  I have independently reviewed the scans and discussed with the patient. No results found.   ASSESSMENT:  1.  High-grade follicular lymphoma: -Presentation with left iliac lymph node enlargement, no B symptoms, initial node biopsy on 10/19/2016 consistent with non-Hodgkin's lymphoma. -Excisional biopsy on 11/27/2017 shows high-grade follicular lymphoma (grade 3A). -4 cycles of R-CHOP from 12/28/2017 through 02/28/2018.  She had difficulty breathing after second and third treatments. -Maintenance rituximab from 05/01/2018 through 03/05/2020. -CT CAP on 06/29/2020 shows resolution of right middle lobe pulmonary nodule.  No abdominal or pelvic adenopathy.  No findings of lymphoma.   2.  Interstitial lung disease: -Evaluated by pulmonary and was thought to have interstitial lung disease. -Mild dyspnea on exertion is stable. -Recent CT chest on 12/31/2019 showed nonspecific lung nodules measuring up to 4 mm.  New small nodule in the right middle lobe measuring 3 mm. -CT on 06/29/2020 with resolution of right middle lobe lung nodule.   PLAN:  1.  High-grade follicular lymphoma: - She does not report any B symptoms or infections in the last 6 months. - Physical examination today did not reveal any palpable adenopathy or splenomegaly. - Reviewed labs from 10/25/2022 which showed  normal LDH, CBC and LFTs. - RTC 6 months for follow-up with repeat scans and labs.     2.  Interstitial lung disease: - She has dyspnea on exertion which is stable.  Continue follow-up with pulmonary.   3.  Elevated creatinine: - Creatinine is 1.03 today and stable.   Orders placed this encounter:  No orders of the defined types were placed in this encounter.    Derek Jack, MD [facility] [phone]

## 2022-11-20 DIAGNOSIS — M79672 Pain in left foot: Secondary | ICD-10-CM | POA: Diagnosis not present

## 2022-11-20 DIAGNOSIS — M722 Plantar fascial fibromatosis: Secondary | ICD-10-CM | POA: Diagnosis not present

## 2022-11-22 DIAGNOSIS — G4700 Insomnia, unspecified: Secondary | ICD-10-CM | POA: Diagnosis not present

## 2022-11-22 DIAGNOSIS — Z79899 Other long term (current) drug therapy: Secondary | ICD-10-CM | POA: Diagnosis not present

## 2022-11-22 DIAGNOSIS — G3184 Mild cognitive impairment, so stated: Secondary | ICD-10-CM | POA: Diagnosis not present

## 2023-01-04 DIAGNOSIS — E782 Mixed hyperlipidemia: Secondary | ICD-10-CM | POA: Diagnosis not present

## 2023-01-04 DIAGNOSIS — R7303 Prediabetes: Secondary | ICD-10-CM | POA: Diagnosis not present

## 2023-01-09 DIAGNOSIS — Z0000 Encounter for general adult medical examination without abnormal findings: Secondary | ICD-10-CM | POA: Diagnosis not present

## 2023-01-10 DIAGNOSIS — I700 Atherosclerosis of aorta: Secondary | ICD-10-CM | POA: Diagnosis not present

## 2023-01-10 DIAGNOSIS — J849 Interstitial pulmonary disease, unspecified: Secondary | ICD-10-CM | POA: Diagnosis not present

## 2023-01-10 DIAGNOSIS — N289 Disorder of kidney and ureter, unspecified: Secondary | ICD-10-CM | POA: Insufficient documentation

## 2023-01-10 DIAGNOSIS — G3184 Mild cognitive impairment, so stated: Secondary | ICD-10-CM | POA: Diagnosis not present

## 2023-01-10 DIAGNOSIS — G4700 Insomnia, unspecified: Secondary | ICD-10-CM | POA: Diagnosis not present

## 2023-01-10 DIAGNOSIS — H3530 Unspecified macular degeneration: Secondary | ICD-10-CM | POA: Diagnosis not present

## 2023-01-10 DIAGNOSIS — E782 Mixed hyperlipidemia: Secondary | ICD-10-CM | POA: Diagnosis not present

## 2023-01-10 DIAGNOSIS — R7303 Prediabetes: Secondary | ICD-10-CM | POA: Diagnosis not present

## 2023-01-10 DIAGNOSIS — I712 Thoracic aortic aneurysm, without rupture, unspecified: Secondary | ICD-10-CM | POA: Diagnosis not present

## 2023-01-10 DIAGNOSIS — R42 Dizziness and giddiness: Secondary | ICD-10-CM | POA: Diagnosis not present

## 2023-01-10 DIAGNOSIS — R911 Solitary pulmonary nodule: Secondary | ICD-10-CM | POA: Diagnosis not present

## 2023-01-10 DIAGNOSIS — I10 Essential (primary) hypertension: Secondary | ICD-10-CM | POA: Diagnosis not present

## 2023-01-10 DIAGNOSIS — C8590 Non-Hodgkin lymphoma, unspecified, unspecified site: Secondary | ICD-10-CM | POA: Diagnosis not present

## 2023-03-20 DIAGNOSIS — H353132 Nonexudative age-related macular degeneration, bilateral, intermediate dry stage: Secondary | ICD-10-CM | POA: Diagnosis not present

## 2023-05-02 ENCOUNTER — Encounter (HOSPITAL_COMMUNITY): Payer: Self-pay | Admitting: Radiology

## 2023-05-02 ENCOUNTER — Ambulatory Visit (HOSPITAL_COMMUNITY)
Admission: RE | Admit: 2023-05-02 | Discharge: 2023-05-02 | Disposition: A | Discharge status: Home / Self Care | Payer: Medicare HMO | Source: Ambulatory Visit | Attending: Hematology | Admitting: Hematology

## 2023-05-02 ENCOUNTER — Inpatient Hospital Stay: Payer: Medicare HMO | Attending: Hematology

## 2023-05-02 DIAGNOSIS — Z79899 Other long term (current) drug therapy: Secondary | ICD-10-CM | POA: Insufficient documentation

## 2023-05-02 DIAGNOSIS — J849 Interstitial pulmonary disease, unspecified: Secondary | ICD-10-CM | POA: Insufficient documentation

## 2023-05-02 DIAGNOSIS — E042 Nontoxic multinodular goiter: Secondary | ICD-10-CM | POA: Diagnosis not present

## 2023-05-02 DIAGNOSIS — C8230 Follicular lymphoma grade IIIa, unspecified site: Secondary | ICD-10-CM | POA: Diagnosis not present

## 2023-05-02 DIAGNOSIS — K5730 Diverticulosis of large intestine without perforation or abscess without bleeding: Secondary | ICD-10-CM | POA: Diagnosis not present

## 2023-05-02 DIAGNOSIS — C8238 Follicular lymphoma grade IIIa, lymph nodes of multiple sites: Secondary | ICD-10-CM

## 2023-05-02 DIAGNOSIS — Z8572 Personal history of non-Hodgkin lymphomas: Secondary | ICD-10-CM | POA: Diagnosis not present

## 2023-05-02 DIAGNOSIS — I700 Atherosclerosis of aorta: Secondary | ICD-10-CM | POA: Diagnosis not present

## 2023-05-02 DIAGNOSIS — C969 Malignant neoplasm of lymphoid, hematopoietic and related tissue, unspecified: Secondary | ICD-10-CM | POA: Diagnosis not present

## 2023-05-02 DIAGNOSIS — K449 Diaphragmatic hernia without obstruction or gangrene: Secondary | ICD-10-CM | POA: Diagnosis not present

## 2023-05-02 LAB — CBC WITH DIFFERENTIAL/PLATELET
Abs Immature Granulocytes: 0.02 10*3/uL (ref 0.00–0.07)
Basophils Absolute: 0.1 10*3/uL (ref 0.0–0.1)
Basophils Relative: 1 %
Eosinophils Absolute: 0.1 10*3/uL (ref 0.0–0.5)
Eosinophils Relative: 2 %
HCT: 44.5 % (ref 36.0–46.0)
Hemoglobin: 14.3 g/dL (ref 12.0–15.0)
Immature Granulocytes: 0 %
Lymphocytes Relative: 38 %
Lymphs Abs: 3.1 10*3/uL (ref 0.7–4.0)
MCH: 29.2 pg (ref 26.0–34.0)
MCHC: 32.1 g/dL (ref 30.0–36.0)
MCV: 90.8 fL (ref 80.0–100.0)
Monocytes Absolute: 0.7 10*3/uL (ref 0.1–1.0)
Monocytes Relative: 9 %
Neutro Abs: 4.1 10*3/uL (ref 1.7–7.7)
Neutrophils Relative %: 50 %
Platelets: 162 10*3/uL (ref 150–400)
RBC: 4.9 MIL/uL (ref 3.87–5.11)
RDW: 14.2 % (ref 11.5–15.5)
WBC: 8.1 10*3/uL (ref 4.0–10.5)
nRBC: 0 % (ref 0.0–0.2)

## 2023-05-02 LAB — COMPREHENSIVE METABOLIC PANEL
ALT: 20 U/L (ref 0–44)
AST: 23 U/L (ref 15–41)
Albumin: 4.1 g/dL (ref 3.5–5.0)
Alkaline Phosphatase: 70 U/L (ref 38–126)
Anion gap: 8 (ref 5–15)
BUN: 15 mg/dL (ref 8–23)
CO2: 29 mmol/L (ref 22–32)
Calcium: 9.4 mg/dL (ref 8.9–10.3)
Chloride: 100 mmol/L (ref 98–111)
Creatinine, Ser: 0.89 mg/dL (ref 0.44–1.00)
GFR, Estimated: >60 mL/min (ref >60)
Glucose, Bld: 106 mg/dL — ABNORMAL HIGH (ref 70–99)
Potassium: 3.6 mmol/L (ref 3.5–5.1)
Sodium: 137 mmol/L (ref 135–145)
Total Bilirubin: 0.9 mg/dL (ref 0.3–1.2)
Total Protein: 6.9 g/dL (ref 6.5–8.1)

## 2023-05-02 LAB — LACTATE DEHYDROGENASE: LDH: 158 U/L (ref 98–192)

## 2023-05-02 MED ORDER — IOHEXOL 300 MG/ML  SOLN
130.0000 mL | Freq: Once | INTRAMUSCULAR | Status: AC | PRN
  Administered 2023-05-02: 100 mL via INTRAVENOUS

## 2023-05-02 MED ORDER — IOHEXOL 300 MG/ML  SOLN: 100.0000 mL | Freq: Once | INTRAMUSCULAR | Status: DC | PRN

## 2023-05-09 NOTE — Progress Notes (Signed)
[facility] [address] [place] [address]    Clinic Day:  05/10/2023  Referring physician: Benita Stabile, MD  Patient Care Team: Benita Stabile, MD as PCP - General (Internal Medicine) Doreatha Massed, MD as Medical Oncologist (Medical Oncology)   ASSESSMENT & PLAN:   Assessment:  1.  High-grade follicular lymphoma: -Presentation with left iliac lymph node enlargement, no B symptoms, initial node biopsy on 10/19/2016 consistent with non-Hodgkin's lymphoma. -Excisional biopsy on 11/27/2017 shows high-grade follicular lymphoma (grade 3A). -4 cycles of R-CHOP from 12/28/2017 through 02/28/2018.  She had difficulty breathing after second and third treatments. -Maintenance rituximab from 05/01/2018 through 03/05/2020. -CT CAP on 06/29/2020 shows resolution of right middle lobe pulmonary nodule.  No abdominal or pelvic adenopathy.  No findings of lymphoma.   2.  Interstitial lung disease: -Evaluated by pulmonary and was thought to have interstitial lung disease. -Mild dyspnea on exertion is stable. -Recent CT chest on 12/31/2019 showed nonspecific lung nodules measuring up to 4 mm.  New small nodule in the right middle lobe measuring 3 mm. -CT on 06/29/2020 with resolution of right middle lobe lung nodule.  Plan:  1.  High-grade follicular lymphoma: - She does not report any B symptoms or infections in the last 6 months. - Physical examination today did not reveal any palpable adenopathy or splenomegaly. - Reviewed labs from 10/25/2022 which showed normal LDH, CBC and LFTs. - RTC 6 months for follow-up with repeat scans and labs.   2.  Interstitial lung disease: - She has dyspnea on exertion which is stable.  Continue follow-up with pulmonary.   3.  Elevated creatinine: - Creatinine is 1.03 today and stable.  No orders of the defined types were placed in this encounter.     Alben Deeds Teague,acting as a Neurosurgeon for Doreatha Massed, MD.,have documented all  relevant documentation on the behalf of Doreatha Massed, MD,as directed by  Doreatha Massed, MD while in the presence of Doreatha Massed, MD.   ***  Dell R Teague   7/24/20248:00 AM  CHIEF COMPLAINT:   Diagnosis: high-grade follicular lymphoma   Cancer Staging  No matching staging information was found for the patient.    Prior Therapy: 1. R-CHOP x 4 cycles from 12/28/2017 to 02/28/2018. 2. Maintenance rituximab from 05/01/2018 to 03/05/2020.    Current Therapy:  surveillance   HISTORY OF PRESENT ILLNESS:   Oncology History  Grade 3a follicular lymphoma of lymph nodes of multiple regions (HCC)  12/20/2017 Initial Diagnosis   Grade 3a follicular lymphoma of lymph nodes of multiple regions (HCC)   12/28/2017 - 03/05/2020 Chemotherapy   Patient is on Treatment Plan : Maintainence Rapid Rituxan every 60 days        INTERVAL HISTORY:   Alicia [name] is a 82 y.o. female presenting to clinic today for follow up of high-grade follicular lymphoma. She was last seen by me on 11/01/22.  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 She also had a CT of the soft tissue neck on 7/17 that found: ***  Today, she states that she is doing well overall. Her appetite level is at ***%. Her energy level is at ***%.  PAST MEDICAL HISTORY:   Past Medical History: Past Medical History:  Diagnosis Date   Arthritis    back and hips  Dementia (HCC)    mild per patient   Dyspnea    with exertion   GERD (gastroesophageal reflux disease)    TUMS   Hypercholesterolemia    Hypertension    Non Hodgkin's lymphoma (HCC)     Surgical History: Past Surgical History:  Procedure Laterality Date   LYMPH NODE BIOPSY Left 11/27/2017   Procedure: LEFT INGUINAL LYMPH NODE BIOPSY;  Surgeon: Almond Lint, MD;   Location: [facility] OR;  Service: General;  Laterality: Left;   MRI  01/19/2021   with anesthesia at [facility] REMOVAL Left 04/14/2021   Procedure: PORT REMOVAL;  Surgeon: Almond Lint, MD;  Location: [facility] OR;  Service: General;  Laterality: Left;   PORTACATH PLACEMENT Left 12/12/2017   Procedure: INSERTION PORT-A-CATH;  Surgeon: Almond Lint, MD;  Location: [facility];  Service: General;  Laterality: Left;   TONSILLECTOMY      Social History: Social History   Socioeconomic History   Marital status: Widowed    Spouse name: Not on file   Number of children: Not on file   Years of education: Not on file   Highest education level: Not on file  Occupational History   Not on file  Tobacco Use   Smoking status: Never   Smokeless tobacco: Never  Vaping Use   Vaping status: Never Used  Substance and Sexual Activity   Alcohol use: No   Drug use: No   Sexual activity: Not Currently    Birth control/protection: Post-menopausal  Other Topics Concern   Not on file  Social History Narrative   Not on file   Social Determinants of Health   Financial Resource Strain: Low Risk  (03/31/2021)   Overall Financial Resource Strain (CARDIA)    Difficulty of Paying Living Expenses: Not hard at all  Food Insecurity: No Food Insecurity (03/31/2021)   Hunger Vital Sign    Worried About Running Out of Food in the Last Year: Never true    Ran Out of Food in the Last Year: Never true  Transportation Needs: No Transportation Needs (03/31/2021)   PRAPARE - Administrator, Civil Service (Medical): No    Lack of Transportation (Non-Medical): No  Physical Activity: Inactive (03/31/2021)   Exercise Vital Sign    Days of Exercise per Week: 0 days    Minutes of Exercise per Session: 0 min  Stress: No Stress Concern Present (03/31/2021)   [employer] of Occupational Health - Occupational Stress Questionnaire    Feeling of Stress : Only a little  Social  Connections: Unknown (02/28/2022)   [facility]   Social Network    Social Network: Not on file  Intimate Partner Violence: Unknown (01/20/2022)   Received from [facility]   HITS    Physically Hurt: Not on file    Insult or Talk Down To: Not on file    Threaten Physical Harm: Not on file    Scream or Curse: Not on file    Family History: Family History  Problem Relation Age of Onset   Lung cancer Mother    Kidney cancer Father    Alcoholism Brother    Emphysema Maternal Aunt    Cancer Maternal Uncle    Cancer Maternal Uncle     Current Medications:  Current Outpatient Medications:    acetaminophen (TYLENOL) 500 MG tablet, Take 250 mg by mouth at bedtime., Disp: , Rfl:    bisoprolol-hydrochlorothiazide (ZIAC) 5-6.25 MG tablet, Take 2 tablets  by mouth daily., Disp: , Rfl:    diphenhydramine-acetaminophen (TYLENOL PM) 25-500 MG TABS tablet, Take 1 tablet by mouth at bedtime., Disp: , Rfl:    Multiple Vitamins-Minerals (PRESERVISION AREDS) CAPS, Take 2 capsules by mouth daily., Disp: , Rfl:    naproxen sodium (ALEVE) 220 MG tablet, Take 220 mg by mouth daily as needed (Pain)., Disp: , Rfl:    Omega-3 Fatty Acids (OMEGA 3 PO), Take 1,000 mg by mouth daily., Disp: , Rfl:    simvastatin (ZOCOR) 20 MG tablet, Take 20 mg by mouth at bedtime., Disp: , Rfl:    Allergies: No Known Allergies  REVIEW OF SYSTEMS:   Review of Systems  Constitutional:  Negative for chills, fatigue and fever.  HENT:   Negative for lump/mass, mouth sores, nosebleeds, sore throat and trouble swallowing.   Eyes:  Negative for eye problems.  Respiratory:  Negative for cough and shortness of breath.   Cardiovascular:  Negative for chest pain, leg swelling and palpitations.  Gastrointestinal:  Negative for abdominal pain, constipation, diarrhea, nausea and vomiting.  Genitourinary:  Negative for bladder incontinence, difficulty urinating, dysuria, frequency, hematuria and nocturia.    Musculoskeletal:  Negative for arthralgias, back pain, flank pain, myalgias and neck pain.  Skin:  Negative for itching and rash.  Neurological:  Negative for dizziness, headaches and numbness.  Hematological:  Does not bruise/bleed easily.  Psychiatric/Behavioral:  Negative for depression, sleep disturbance and suicidal ideas. The patient is not nervous/anxious.   All other systems reviewed and are negative.    VITALS:   There were no vitals taken for this visit.  Wt Readings from Last 3 Encounters:  11/01/22 134 lb 7.7 oz (61 kg)  04/29/22 134 lb (60.8 kg)  04/26/22 132 lb 9.6 oz (60.1 kg)    There is no height or weight on file to calculate BMI.  Performance status (ECOG): {CHL ONC [id number]  PHYSICAL EXAM:   Physical Exam Vitals and nursing note reviewed. Exam conducted with a chaperone present.  Constitutional:      Appearance: Normal appearance.  Cardiovascular:     Rate and Rhythm: Normal rate and regular rhythm.     Pulses: Normal pulses.     Heart sounds: Normal heart sounds.  Pulmonary:     Effort: Pulmonary effort is normal.     Breath sounds: Normal breath sounds.  Abdominal:     Palpations: Abdomen is soft. There is no hepatomegaly, splenomegaly or mass.     Tenderness: There is no abdominal tenderness.  Musculoskeletal:     Right lower leg: No edema.     Left lower leg: No edema.  Lymphadenopathy:     Cervical: No cervical adenopathy.     Right cervical: No superficial, deep or posterior cervical adenopathy.    Left cervical: No superficial, deep or posterior cervical adenopathy.     Upper Body:     Right upper body: No supraclavicular or axillary adenopathy.     Left upper body: No supraclavicular or axillary adenopathy.  Neurological:     General: No focal deficit present.     Mental Status: She is alert and oriented to person, place, and time.  Psychiatric:        Mood and Affect: Mood normal.        Behavior: Behavior normal.     LABS:       Latest Ref Rng & Units 05/02/2023    8:39 AM 10/25/2022   10:17 AM 04/29/2022   10:20 AM  CBC  WBC 4.0 - 10.5 K/uL 8.1  9.2  10.2   Hemoglobin 12.0 - 15.0 g/dL 48.5  46.2  70.3   Hematocrit 36.0 - 46.0 % 44.5  40.3  41.7   Platelets 150 - 400 K/uL 162  176  179       Latest Ref Rng & Units 05/02/2023    8:39 AM 10/25/2022   10:17 AM 04/29/2022   10:20 AM  CMP  Glucose 70 - 99 mg/dL 500  938  182   BUN 8 - 23 mg/dL 15  17  17    Creatinine 0.44 - 1.00 mg/dL 9.93  7.16  9.67   Sodium 135 - 145 mmol/L 137  140  142   Potassium 3.5 - 5.1 mmol/L 3.6  3.5  3.6   Chloride 98 - 111 mmol/L 100  102  107   CO2 22 - 32 mmol/L 29  28  22    Calcium 8.9 - 10.3 mg/dL 9.4  9.2  9.8   Total Protein 6.5 - 8.1 g/dL 6.9  6.2  6.9   Total Bilirubin 0.3 - 1.2 mg/dL 0.9  0.5  1.1   Alkaline Phos 38 - 126 U/L 70  66  69   AST 15 - 41 U/L 23  22  39   ALT 0 - 44 U/L 20  16  22       No results found for: "CEA1", "CEA" / No results found for: "CEA1", "CEA" No results found for: "PSA1" No results found for: "ELF810" No results found for: "CAN125"  No results found for: "TOTALPROTELP", "ALBUMINELP", "A1GS", "A2GS", "BETS", "BETA2SER", "GAMS", "MSPIKE", "SPEI" No results found for: "TIBC", "FERRITIN", "IRONPCTSAT" Lab Results  Component Value Date   LDH 158 05/02/2023   LDH 142 10/25/2022   LDH 157 04/19/2022     STUDIES:   CT CHEST ABDOMEN PELVIS W CONTRAST  Result Date: 05/02/2023 CLINICAL DATA:  History of follicular lymphoma, surveillance. * Tracking Code: BO * EXAM: CT CHEST, ABDOMEN, AND PELVIS WITH CONTRAST TECHNIQUE: Multidetector CT imaging of the chest, abdomen and pelvis was performed following the standard protocol during bolus administration of intravenous contrast. RADIATION DOSE REDUCTION: This exam was performed according to the departmental dose-optimization program which includes automated exposure control, adjustment of the mA and/or kV according to patient size and/or use of  iterative reconstruction technique. CONTRAST:  OMNIPAQUE IOHEXOL 300 MG/ML  SOLN COMPARISON:  Multiple priors including most recent CT April 19, 2022 FINDINGS: CT CHEST FINDINGS Cardiovascular: Aortic atherosclerosis. 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 No central pulmonary embolus on this nondedicated study. Normal size heart. No significant pericardial effusion/thickening. Mediastinum/Nodes: No suspicious thyroid nodule. No pathologically enlarged mediastinal, hilar or axillary lymph nodes. The esophagus is grossly unremarkable. Lungs/Pleura: Stable scattered sub 4 mm pulmonary nodules favored benign. Increased subpleural reticular opacities in the bilateral lung bases may reflect atelectasis or scarring. No pleural effusion. No pneumothorax. Musculoskeletal: No aggressive lytic or blastic lesion of bone. Multilevel degenerative changes spine. Degenerative change of the bilateral shoulders. CT ABDOMEN PELVIS FINDINGS Hepatobiliary: No suspicious hepatic lesion. Gallbladder is unremarkable. No biliary ductal dilation. Pancreas: No pancreatic ductal dilation or evidence of acute inflammation. Spleen: No splenomegaly. Adrenals/Urinary Tract: Bilateral adrenal glands appear normal. No hydronephrosis. Left renal cyst on image 68/2 is considered benign and requiring no independent imaging follow-up. Kidneys demonstrate symmetric enhancement. Urinary bladder is unremarkable for degree of distension. Stomach/Bowel: Radiopaque enteric contrast material traverses the ascending colon. Small  hiatal hernia. No pathologic dilation of small or large bowel. Normal appendix. No evidence of acute bowel inflammation. Left-sided colonic diverticulosis without findings of acute diverticulitis. Vascular/Lymphatic: Aortic atherosclerosis. Normal caliber abdominal aorta. Smooth IVC contours. The portal, splenic and superior mesenteric veins are patent. No pathologically  enlarged abdominal or pelvic lymph nodes. Reproductive: Uterus and bilateral adnexa are unremarkable. Other: No significant abdominopelvic free fluid. Left inguinal surgical clips. Musculoskeletal: No aggressive lytic or blastic lesion of bone. Multilevel degenerative changes spine. Mild degenerative change of the bilateral hips and SI joints. IMPRESSION: 1. No lymphadenopathy in the chest, abdomen or pelvis to suggest recurrent lymphoma. 2. 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 Continued attention on follow-up oncologic imaging suggested. 3.  Aortic Atherosclerosis (ICD10-I70.0). Electronically Signed   By: Maudry Mayhew M.D.   On: 05/02/2023 15:33

## 2023-05-10 ENCOUNTER — Inpatient Hospital Stay (HOSPITAL_BASED_OUTPATIENT_CLINIC_OR_DEPARTMENT_OTHER): Payer: Medicare HMO | Admitting: Hematology

## 2023-05-10 VITALS — BP 105/63 | HR 65 | Temp 98.1°F | Resp 18 | Ht 65.0 in | Wt 133.2 lb

## 2023-05-10 DIAGNOSIS — J849 Interstitial pulmonary disease, unspecified: Secondary | ICD-10-CM | POA: Diagnosis not present

## 2023-05-10 DIAGNOSIS — C8230 Follicular lymphoma grade IIIa, unspecified site: Secondary | ICD-10-CM | POA: Diagnosis not present

## 2023-05-10 DIAGNOSIS — C8238 Follicular lymphoma grade IIIa, lymph nodes of multiple sites: Secondary | ICD-10-CM | POA: Diagnosis not present

## 2023-05-10 DIAGNOSIS — Z79899 Other long term (current) drug therapy: Secondary | ICD-10-CM | POA: Diagnosis not present

## 2023-05-10 NOTE — Patient Instructions (Signed)
[facility] at [facility] Discharge Instructions   You were seen and examined today by Dr. Ellin Saba.  He reviewed the results of your lab work which are normal.  He reviewed the results of your CT scans which were normal.   We will see you back in 6 months. We will repeat lab work at that time.    Thank you for choosing [facility] at [facility] to provide your oncology and hematology care.  To afford each patient quality time with our provider, please arrive at least 15 minutes before your scheduled appointment time.   If you have a lab appointment with the Cancer Center please come in thru the Main Entrance and check in at the main information desk.  You need to re-schedule your appointment should you arrive 10 or more minutes late.  We strive to give you quality time with our providers, and arriving late affects you and other patients whose appointments are after yours.  Also, if you no show three or more times for appointments you may be dismissed from the clinic at the providers discretion.     Again, thank you for choosing [facility].  Our hope is that these requests will decrease the amount of time that you wait before being seen by our physicians.       _____________________________________________________________  Should you have questions after your visit to [facility], please contact our office at [phone] and follow the prompts.  Our office hours are 8:00 a.m. and 4:30 p.m. Monday - Friday.  Please note that voicemails left after 4:00 p.m. may not be returned until the following business day.  We are closed weekends and major holidays.  You do have access to a nurse 24-7, just call the main number to the clinic [phone] and do not press any options, hold on the line and a nurse will answer the phone.    For prescription refill requests, have your pharmacy contact our office and allow 72 hours.     Due to Covid, you will need to wear a mask upon entering the hospital. If you do not have a mask, a mask will be given to you at the Main Entrance upon arrival. For doctor visits, patients may have 1 support person age 4 or older with them. For treatment visits, patients can not have anyone with them due to social distancing guidelines and our immunocompromised population.

## 2023-07-09 DIAGNOSIS — E782 Mixed hyperlipidemia: Secondary | ICD-10-CM | POA: Diagnosis not present

## 2023-07-09 DIAGNOSIS — R7303 Prediabetes: Secondary | ICD-10-CM | POA: Diagnosis not present

## 2023-07-13 DIAGNOSIS — G4700 Insomnia, unspecified: Secondary | ICD-10-CM | POA: Diagnosis not present

## 2023-07-13 DIAGNOSIS — R42 Dizziness and giddiness: Secondary | ICD-10-CM | POA: Diagnosis not present

## 2023-07-13 DIAGNOSIS — H3530 Unspecified macular degeneration: Secondary | ICD-10-CM | POA: Diagnosis not present

## 2023-07-13 DIAGNOSIS — R911 Solitary pulmonary nodule: Secondary | ICD-10-CM | POA: Diagnosis not present

## 2023-07-13 DIAGNOSIS — J849 Interstitial pulmonary disease, unspecified: Secondary | ICD-10-CM | POA: Diagnosis not present

## 2023-07-13 DIAGNOSIS — E782 Mixed hyperlipidemia: Secondary | ICD-10-CM | POA: Diagnosis not present

## 2023-07-13 DIAGNOSIS — I700 Atherosclerosis of aorta: Secondary | ICD-10-CM | POA: Diagnosis not present

## 2023-07-13 DIAGNOSIS — I10 Essential (primary) hypertension: Secondary | ICD-10-CM | POA: Diagnosis not present

## 2023-07-13 DIAGNOSIS — R7303 Prediabetes: Secondary | ICD-10-CM | POA: Diagnosis not present

## 2023-07-13 DIAGNOSIS — I712 Thoracic aortic aneurysm, without rupture, unspecified: Secondary | ICD-10-CM | POA: Diagnosis not present

## 2023-07-13 DIAGNOSIS — C8590 Non-Hodgkin lymphoma, unspecified, unspecified site: Secondary | ICD-10-CM | POA: Diagnosis not present

## 2023-07-13 DIAGNOSIS — G3184 Mild cognitive impairment, so stated: Secondary | ICD-10-CM | POA: Diagnosis not present

## 2023-09-11 ENCOUNTER — Other Ambulatory Visit (HOSPITAL_COMMUNITY): Payer: Self-pay | Admitting: Internal Medicine

## 2023-09-11 DIAGNOSIS — Z713 Dietary counseling and surveillance: Secondary | ICD-10-CM | POA: Diagnosis not present

## 2023-09-11 DIAGNOSIS — I10 Essential (primary) hypertension: Secondary | ICD-10-CM | POA: Diagnosis not present

## 2023-09-11 DIAGNOSIS — Z79899 Other long term (current) drug therapy: Secondary | ICD-10-CM | POA: Diagnosis not present

## 2023-09-11 DIAGNOSIS — G3184 Mild cognitive impairment, so stated: Secondary | ICD-10-CM | POA: Diagnosis not present

## 2023-09-11 DIAGNOSIS — Z6823 Body mass index (BMI) 23.0-23.9, adult: Secondary | ICD-10-CM | POA: Diagnosis not present

## 2023-09-18 ENCOUNTER — Ambulatory Visit (HOSPITAL_COMMUNITY)
Admission: RE | Admit: 2023-09-18 | Discharge: 2023-09-18 | Disposition: A | Discharge status: Home / Self Care | Payer: Medicare HMO | Source: Ambulatory Visit | Attending: Internal Medicine | Admitting: Internal Medicine

## 2023-09-18 DIAGNOSIS — G3184 Mild cognitive impairment, so stated: Secondary | ICD-10-CM | POA: Diagnosis not present

## 2023-09-18 DIAGNOSIS — R220 Localized swelling, mass and lump, head: Secondary | ICD-10-CM | POA: Diagnosis not present

## 2023-09-18 DIAGNOSIS — R9082 White matter disease, unspecified: Secondary | ICD-10-CM | POA: Diagnosis not present

## 2023-11-07 ENCOUNTER — Inpatient Hospital Stay: Payer: Medicare HMO | Attending: Hematology

## 2023-11-07 DIAGNOSIS — Z809 Family history of malignant neoplasm, unspecified: Secondary | ICD-10-CM | POA: Diagnosis not present

## 2023-11-07 DIAGNOSIS — C8238 Follicular lymphoma grade IIIa, lymph nodes of multiple sites: Secondary | ICD-10-CM | POA: Insufficient documentation

## 2023-11-07 DIAGNOSIS — J849 Interstitial pulmonary disease, unspecified: Secondary | ICD-10-CM | POA: Diagnosis not present

## 2023-11-07 DIAGNOSIS — Z79899 Other long term (current) drug therapy: Secondary | ICD-10-CM | POA: Insufficient documentation

## 2023-11-07 DIAGNOSIS — Z801 Family history of malignant neoplasm of trachea, bronchus and lung: Secondary | ICD-10-CM | POA: Insufficient documentation

## 2023-11-07 LAB — CBC WITH DIFFERENTIAL/PLATELET
Abs Immature Granulocytes: 0.02 10*3/uL (ref 0.00–0.07)
Basophils Absolute: 0.1 10*3/uL (ref 0.0–0.1)
Basophils Relative: 1 %
Eosinophils Absolute: 0.1 10*3/uL (ref 0.0–0.5)
Eosinophils Relative: 2 %
HCT: 43 % (ref 36.0–46.0)
Hemoglobin: 14.1 g/dL (ref 12.0–15.0)
Immature Granulocytes: 0 %
Lymphocytes Relative: 35 %
Lymphs Abs: 2.6 10*3/uL (ref 0.7–4.0)
MCH: 30 pg (ref 26.0–34.0)
MCHC: 32.8 g/dL (ref 30.0–36.0)
MCV: 91.5 fL (ref 80.0–100.0)
Monocytes Absolute: 0.6 10*3/uL (ref 0.1–1.0)
Monocytes Relative: 9 %
Neutro Abs: 4 10*3/uL (ref 1.7–7.7)
Neutrophils Relative %: 53 %
Platelets: 176 10*3/uL (ref 150–400)
RBC: 4.7 MIL/uL (ref 3.87–5.11)
RDW: 13.8 % (ref 11.5–15.5)
WBC: 7.4 10*3/uL (ref 4.0–10.5)
nRBC: 0 % (ref 0.0–0.2)

## 2023-11-07 LAB — COMPREHENSIVE METABOLIC PANEL
ALT: 18 U/L (ref 0–44)
AST: 22 U/L (ref 15–41)
Albumin: 3.9 g/dL (ref 3.5–5.0)
Alkaline Phosphatase: 70 U/L (ref 38–126)
Anion gap: 9 (ref 5–15)
BUN: 16 mg/dL (ref 8–23)
CO2: 29 mmol/L (ref 22–32)
Calcium: 9.2 mg/dL (ref 8.9–10.3)
Chloride: 102 mmol/L (ref 98–111)
Creatinine, Ser: 0.85 mg/dL (ref 0.44–1.00)
GFR, Estimated: >60 mL/min (ref >60)
Glucose, Bld: 139 mg/dL — ABNORMAL HIGH (ref 70–99)
Potassium: 3.5 mmol/L (ref 3.5–5.1)
Sodium: 140 mmol/L (ref 135–145)
Total Bilirubin: 0.7 mg/dL (ref 0.0–1.2)
Total Protein: 6.4 g/dL — ABNORMAL LOW (ref 6.5–8.1)

## 2023-11-07 LAB — LACTATE DEHYDROGENASE: LDH: 145 U/L (ref 98–192)

## 2023-11-13 NOTE — Progress Notes (Signed)
[facility] [address] [place] [address]    Clinic Day:  11/14/2023  Referring physician: Benita Stabile, MD  Patient Care Team: Benita Stabile, MD as PCP - General (Internal Medicine) Doreatha Massed, MD as Medical Oncologist (Medical Oncology)   ASSESSMENT & PLAN:   Assessment: 1.  High-grade follicular lymphoma: -Presentation with left iliac lymph node enlargement, no B symptoms, initial node biopsy on 10/19/2016 consistent with non-Hodgkin's lymphoma. -Excisional biopsy on 11/27/2017 shows high-grade follicular lymphoma (grade 3A). -4 cycles of R-CHOP from 12/28/2017 through 02/28/2018.  She had difficulty breathing after second and third treatments. -Maintenance rituximab from 05/01/2018 through 03/05/2020. -CT CAP on 06/29/2020 shows resolution of right middle lobe pulmonary nodule.  No abdominal or pelvic adenopathy.  No findings of lymphoma.   2.  Interstitial lung disease: -Evaluated by pulmonary and was thought to have interstitial lung disease. -Mild dyspnea on exertion is stable. -Recent CT chest on 12/31/2019 showed nonspecific lung nodules measuring up to 4 mm.  New small nodule in the right middle lobe measuring 3 mm. -CT on 06/29/2020 with resolution of right middle lobe lung nodule.    Plan: 1.  High-grade follicular lymphoma: - Denies fevers, night sweats or weight loss in the last 6 months.  Denies any infections. - Physical exam: No palpable adenopathy or splenomegaly. - Reviewed labs from 11/07/2023: LDH was normal.  CBC within normal limits. - She reports having an episode of "memory blackout" in November when she was driving.  She turned onto [address], last memory and did not remember until she drove back into her driveway.  A CT of the head without contrast on 09/18/2023 showed chronic small vessel ischemic changes of cerebral white matter. - Consider carotid ultrasound.  She has an appointment with neurology in March. - RTC 6  months for follow-up with CT soft tissue neck and CT CAP and labs including LDH.   2.  Interstitial lung disease: - Dyspnea on exertion is stable.    Orders Placed This Encounter  Procedures   CT CHEST ABDOMEN PELVIS W CONTRAST    Standing Status:   Future    Expected Date:   12/05/2024    If indicated for the ordered procedure, I authorize the administration of contrast media per Radiology protocol:   Yes    Does the patient have a contrast media/X-ray dye allergy?:   No    Preferred imaging location?:   [facility]    If indicated for the ordered procedure, I authorize the administration of oral contrast media per Radiology protocol:   Yes   CT SOFT TISSUE NECK W CONTRAST    Standing Status:   Future    Expected Date:   11/13/2024    Expiration Date:   11/13/2024    If indicated for the ordered procedure, I authorize the administration of contrast media per Radiology protocol:   Yes    Does the patient have a contrast media/X-ray dye allergy?:   No    Preferred imaging location?:   [facility]   CBC with Differential    Standing Status:   Future    Expected Date:   05/05/2024    Expiration Date:   11/13/2024   Comprehensive metabolic panel    Standing Status:   Future    Expected Date:   05/05/2024    Expiration Date:   11/13/2024   Lactate dehydrogenase    Standing Status:   Future  Expected Date:   05/05/2024    Expiration Date:   11/13/2024      I,Katie Daubenspeck,acting as a scribe for Doreatha Massed, MD.,have documented all relevant documentation on the behalf of Doreatha Massed, MD,as directed by  Doreatha Massed, MD while in the presence of Doreatha Massed, MD.   I, Doreatha Massed MD, have reviewed the above documentation for accuracy and completeness, and I agree with the above.   Doreatha Massed, MD   1/29/20258:18 AM  CHIEF COMPLAINT:   Diagnosis: high-grade follicular lymphoma    Cancer Staging  No matching staging  information was found for the patient.    Prior Therapy: 1. R-CHOP x 4 cycles from 12/28/2017 to 02/28/2018. 2. Maintenance rituximab from 05/01/2018 to 03/05/2020.  Current Therapy:  surveillance    HISTORY OF PRESENT ILLNESS:   Oncology History  Grade 3a follicular lymphoma of lymph nodes of multiple regions (HCC)  12/20/2017 Initial Diagnosis   Grade 3a follicular lymphoma of lymph nodes of multiple regions (HCC)   12/28/2017 - 03/05/2020 Chemotherapy   Patient is on Treatment Plan : Maintainence Rapid Rituxan every 60 days        INTERVAL HISTORY:   Alicia [name] is a 83 y.o. female presenting to clinic today for follow up of high-grade follicular lymphoma. She was last seen by me on 05/10/23.  Today, she states that she is doing well overall. Her appetite level is at 100%. Her energy level is at 100%.  PAST MEDICAL HISTORY:   Past Medical History: Past Medical History:  Diagnosis Date   Arthritis    back and hips   Dementia (HCC)    mild per patient   Dyspnea    with exertion   GERD (gastroesophageal reflux disease)    TUMS   Hypercholesterolemia    Hypertension    Non Hodgkin's lymphoma (HCC)     Surgical History: Past Surgical History:  Procedure Laterality Date   LYMPH NODE BIOPSY Left 11/27/2017   Procedure: LEFT INGUINAL LYMPH NODE BIOPSY;  Surgeon: Almond Lint, MD;  Location: [facility] OR;  Service: General;  Laterality: Left;   MRI  01/19/2021   with anesthesia at [facility] REMOVAL Left 04/14/2021   Procedure: PORT REMOVAL;  Surgeon: Almond Lint, MD;  Location: [facility] OR;  Service: General;  Laterality: Left;   PORTACATH PLACEMENT Left 12/12/2017   Procedure: INSERTION PORT-A-CATH;  Surgeon: Almond Lint, MD;  Location: [facility];  Service: General;  Laterality: Left;   TONSILLECTOMY      Social History: Social History   Socioeconomic History   Marital status: Widowed    Spouse name: Not on file   Number of children: Not on  file   Years of education: Not on file   Highest education level: Not on file  Occupational History   Not on file  Tobacco Use   Smoking status: Never   Smokeless tobacco: Never  Vaping Use   Vaping status: Never Used  Substance and Sexual Activity   Alcohol use: No   Drug use: No   Sexual activity: Not Currently    Birth control/protection: Post-menopausal  Other Topics Concern   Not on file  Social History Narrative   Not on file   Social Drivers of Health   Financial Resource Strain: Low Risk  (03/31/2021)   Overall Financial Resource Strain (CARDIA)    Difficulty of Paying Living Expenses: Not hard at all  Food Insecurity: No Food Insecurity (03/31/2021)   Hunger Vital  Sign    Worried About Programme researcher, broadcasting/film/video in the Last Year: Never true    Ran Out of Food in the Last Year: Never true  Transportation Needs: No Transportation Needs (03/31/2021)   PRAPARE - Administrator, Civil Service (Medical): No    Lack of Transportation (Non-Medical): No  Physical Activity: Inactive (03/31/2021)   Exercise Vital Sign    Days of Exercise per Week: 0 days    Minutes of Exercise per Session: 0 min  Stress: No Stress Concern Present (03/31/2021)   [employer] of Occupational Health - Occupational Stress Questionnaire    Feeling of Stress : Only a little  Social Connections: Unknown (02/28/2022)   Received from [facility]   Social Network    Social Network: Not on file  Intimate Partner Violence: Unknown (01/20/2022)   Received from [facility], [facility]   HITS    Physically Hurt: Not on file    Insult or Talk Down To: Not on file    Threaten Physical Harm: Not on file    Scream or Curse: Not on file    Family History: Family History  Problem Relation Age of Onset   Lung cancer Mother    Kidney cancer Father    Alcoholism Brother    Emphysema Maternal Aunt    Cancer Maternal Uncle    Cancer Maternal Uncle     Current  Medications:  Current Outpatient Medications:    acetaminophen (TYLENOL) 500 MG tablet, Take 250 mg by mouth at bedtime., Disp: , Rfl:    bisoprolol-hydrochlorothiazide (ZIAC) 5-6.25 MG tablet, Take 2 tablets by mouth daily., Disp: , Rfl:    diphenhydramine-acetaminophen (TYLENOL PM) 25-500 MG TABS tablet, Take 1 tablet by mouth at bedtime., Disp: , Rfl:    Multiple Vitamins-Minerals (PRESERVISION AREDS) CAPS, Take 2 capsules by mouth daily., Disp: , Rfl:    naproxen sodium (ALEVE) 220 MG tablet, Take 220 mg by mouth daily as needed (Pain)., Disp: , Rfl:    Omega-3 Fatty Acids (OMEGA 3 PO), Take 1,000 mg by mouth daily., Disp: , Rfl:    simvastatin (ZOCOR) 20 MG tablet, Take 20 mg by mouth at bedtime., Disp: , Rfl:    Allergies: No Known Allergies  REVIEW OF SYSTEMS:   Review of Systems  Constitutional:  Negative for chills, fatigue and fever.  HENT:   Negative for lump/mass, mouth sores, nosebleeds, sore throat and trouble swallowing.   Eyes:  Negative for eye problems.  Respiratory:  Positive for shortness of breath. Negative for cough.   Cardiovascular:  Negative for chest pain, leg swelling and palpitations.  Gastrointestinal:  Negative for abdominal pain, constipation, diarrhea, nausea and vomiting.  Genitourinary:  Negative for bladder incontinence, difficulty urinating, dysuria, frequency, hematuria and nocturia.   Musculoskeletal:  Negative for arthralgias, back pain, flank pain, myalgias and neck pain.  Skin:  Negative for itching and rash.  Neurological:  Positive for numbness. Negative for dizziness and headaches.  Hematological:  Does not bruise/bleed easily.  Psychiatric/Behavioral:  Positive for sleep disturbance. Negative for depression and suicidal ideas. The patient is not nervous/anxious.   All other systems reviewed and are negative.    VITALS:   Blood pressure 132/67, pulse 66, temperature 98.6 F (37 C), resp. rate 18, weight 137 lb 5.6 oz (62.3 kg), SpO2 97%.   Wt Readings from Last 3 Encounters:  11/14/23 137 lb 5.6 oz (62.3 kg)  05/10/23 133 lb 3.2 oz (60.4 kg)  11/01/22  134 lb 7.7 oz (61 kg)    Body mass index is 22.86 kg/m.  Performance status (ECOG): 1 - Symptomatic but completely ambulatory  PHYSICAL EXAM:   Physical Exam Vitals and nursing note reviewed. Exam conducted with a chaperone present.  Constitutional:      Appearance: Normal appearance.  Cardiovascular:     Rate and Rhythm: Normal rate and regular rhythm.     Pulses: Normal pulses.     Heart sounds: Normal heart sounds.  Pulmonary:     Effort: Pulmonary effort is normal.     Breath sounds: Normal breath sounds.  Abdominal:     Palpations: Abdomen is soft. There is no hepatomegaly, splenomegaly or mass.     Tenderness: There is no abdominal tenderness.  Musculoskeletal:     Right lower leg: No edema.     Left lower leg: No edema.  Lymphadenopathy:     Cervical: No cervical adenopathy.     Right cervical: No superficial, deep or posterior cervical adenopathy.    Left cervical: No superficial, deep or posterior cervical adenopathy.     Upper Body:     Right upper body: No supraclavicular or axillary adenopathy.     Left upper body: No supraclavicular or axillary adenopathy.  Neurological:     General: No focal deficit present.     Mental Status: She is alert and oriented to person, place, and time.  Psychiatric:        Mood and Affect: Mood normal.        Behavior: Behavior normal.     LABS:   CBC     Component Value Date/Time   WBC 7.4 11/07/2023 0740   RBC 4.70 11/07/2023 0740   HGB 14.1 11/07/2023 0740   HGB 11.6 02/28/2018 0824   HCT 43.0 11/07/2023 0740   PLT 176 11/07/2023 0740   PLT 220 02/28/2018 0824   MCV 91.5 11/07/2023 0740   MCH 30.0 11/07/2023 0740   MCHC 32.8 11/07/2023 0740   RDW 13.8 11/07/2023 0740   LYMPHSABS 2.6 11/07/2023 0740   MONOABS 0.6 11/07/2023 0740   EOSABS 0.1 11/07/2023 0740   BASOSABS 0.1 11/07/2023 0740     CMP      Component Value Date/Time   NA 140 11/07/2023 0740   NA 145 (H) 03/02/2022 0919   K 3.5 11/07/2023 0740   CL 102 11/07/2023 0740   CO2 29 11/07/2023 0740   GLUCOSE 139 (H) 11/07/2023 0740   BUN 16 11/07/2023 0740   BUN 16 03/02/2022 0919   CREATININE 0.85 11/07/2023 0740   CREATININE 0.89 05/01/2018 0747   CALCIUM 9.2 11/07/2023 0740   PROT 6.4 (L) 11/07/2023 0740   ALBUMIN 3.9 11/07/2023 0740   AST 22 11/07/2023 0740   AST 29 05/01/2018 0747   ALT 18 11/07/2023 0740   ALT 25 05/01/2018 0747   ALKPHOS 70 11/07/2023 0740   BILITOT 0.7 11/07/2023 0740   BILITOT 0.4 05/01/2018 0747   GFRNONAA >60 11/07/2023 0740   GFRNONAA >60 05/01/2018 0747   GFRAA >60 06/23/2020 1043   GFRAA >60 05/01/2018 0747     No results found for: "CEA1", "CEA" / No results found for: "CEA1", "CEA" No results found for: "PSA1" No results found for: "ZOX096" No results found for: "CAN125"  No results found for: "TOTALPROTELP", "ALBUMINELP", "A1GS", "A2GS", "BETS", "BETA2SER", "GAMS", "MSPIKE", "SPEI" No results found for: "TIBC", "FERRITIN", "IRONPCTSAT" Lab Results  Component Value Date   LDH 145 11/07/2023   LDH 158 05/02/2023  LDH 142 10/25/2022     STUDIES:   No results found.

## 2023-11-14 ENCOUNTER — Encounter: Payer: Self-pay | Admitting: Hematology

## 2023-11-14 ENCOUNTER — Inpatient Hospital Stay (HOSPITAL_BASED_OUTPATIENT_CLINIC_OR_DEPARTMENT_OTHER): Payer: Medicare HMO | Admitting: Hematology

## 2023-11-14 VITALS — BP 132/67 | HR 66 | Temp 98.6°F | Resp 18 | Wt 137.3 lb

## 2023-11-14 DIAGNOSIS — J849 Interstitial pulmonary disease, unspecified: Secondary | ICD-10-CM | POA: Diagnosis not present

## 2023-11-14 DIAGNOSIS — Z79899 Other long term (current) drug therapy: Secondary | ICD-10-CM | POA: Diagnosis not present

## 2023-11-14 DIAGNOSIS — C8238 Follicular lymphoma grade IIIa, lymph nodes of multiple sites: Secondary | ICD-10-CM | POA: Diagnosis not present

## 2023-11-14 DIAGNOSIS — Z809 Family history of malignant neoplasm, unspecified: Secondary | ICD-10-CM | POA: Diagnosis not present

## 2023-11-14 DIAGNOSIS — Z801 Family history of malignant neoplasm of trachea, bronchus and lung: Secondary | ICD-10-CM | POA: Diagnosis not present

## 2023-11-14 NOTE — Patient Instructions (Addendum)
[facility] at [facility] Discharge Instructions   You were seen and examined today by Dr. Ellin Saba.  He reviewed the results of your lab work which are normal/stable.   We will see you back in 6 months. We will repeat your lab work and CT scans prior to this visit.    Return as scheduled.    Thank you for choosing [facility] at [facility] to provide your oncology and hematology care.  To afford each patient quality time with our provider, please arrive at least 15 minutes before your scheduled appointment time.   If you have a lab appointment with the Cancer Center please come in thru the Main Entrance and check in at the main information desk.  You need to re-schedule your appointment should you arrive 10 or more minutes late.  We strive to give you quality time with our providers, and arriving late affects you and other patients whose appointments are after yours.  Also, if you no show three or more times for appointments you may be dismissed from the clinic at the providers discretion.     Again, thank you for choosing [facility].  Our hope is that these requests will decrease the amount of time that you wait before being seen by our physicians.       _____________________________________________________________  Should you have questions after your visit to [facility], please contact our office at [phone] and follow the prompts.  Our office hours are 8:00 a.m. and 4:30 p.m. Monday - Friday.  Please note that voicemails left after 4:00 p.m. may not be returned until the following business day.  We are closed weekends and major holidays.  You do have access to a nurse 24-7, just call the main number to the clinic [phone] and do not press any options, hold on the line and a nurse will answer the phone.    For prescription refill requests, have your pharmacy contact our office and allow 72 hours.    Due  to Covid, you will need to wear a mask upon entering the hospital. If you do not have a mask, a mask will be given to you at the Main Entrance upon arrival. For doctor visits, patients may have 1 support person age 43 or older with them. For treatment visits, patients can not have anyone with them due to social distancing guidelines and our immunocompromised population.

## 2023-12-12 DIAGNOSIS — M79671 Pain in right foot: Secondary | ICD-10-CM | POA: Diagnosis not present

## 2023-12-12 DIAGNOSIS — I10 Essential (primary) hypertension: Secondary | ICD-10-CM | POA: Diagnosis not present

## 2023-12-12 DIAGNOSIS — G3184 Mild cognitive impairment, so stated: Secondary | ICD-10-CM | POA: Diagnosis not present

## 2023-12-12 DIAGNOSIS — C8590 Non-Hodgkin lymphoma, unspecified, unspecified site: Secondary | ICD-10-CM | POA: Diagnosis not present

## 2023-12-13 ENCOUNTER — Ambulatory Visit: Payer: Medicare HMO | Admitting: Orthopedic Surgery

## 2023-12-13 ENCOUNTER — Encounter: Payer: Self-pay | Admitting: Orthopedic Surgery

## 2023-12-13 ENCOUNTER — Other Ambulatory Visit (INDEPENDENT_AMBULATORY_CARE_PROVIDER_SITE_OTHER): Payer: Self-pay

## 2023-12-13 VITALS — BP 186/120 | HR 56 | Ht 66.0 in | Wt 130.0 lb

## 2023-12-13 DIAGNOSIS — M25571 Pain in right ankle and joints of right foot: Secondary | ICD-10-CM

## 2023-12-13 DIAGNOSIS — S93411A Sprain of calcaneofibular ligament of right ankle, initial encounter: Secondary | ICD-10-CM

## 2023-12-13 NOTE — Progress Notes (Signed)
  Intake history:  BP (!) 186/120   Pulse (!) 56   Ht 5\' 6"  (1.676 m)   Wt 130 lb (59 kg)   BMI 20.98 kg/m  Body mass index is 20.98 kg/m.    WHAT ARE WE SEEING YOU FOR TODAY?   right ankle(s)  How long has this bothered you? (DOI?DOS?WS?)  on 12/03/23  Anticoag.  No  Diabetes No  Heart disease No  Hypertension Yes  SMOKING HX No  Kidney disease No  Any ALLERGIES __________No Known Allergies ____________________________________   Treatment:  Have you taken:  Tylenol Yes  Advil No  Had PT No  Had injection No  Other  _________________________

## 2023-12-13 NOTE — Progress Notes (Signed)
  Intake history:  BP (!) 186/120   Pulse (!) 56   Ht 5\' 6"  (1.676 m)   Wt 130 lb (59 kg)   BMI 20.98 kg/m  Body mass index is 20.98 kg/m.    WHAT ARE WE SEEING YOU FOR TODAY?   right ankle(s)  How long has this bothered you? (DOI?DOS?WS?)  on 12/03/23  Anticoag.  No  Diabetes No  Heart disease No  Hypertension Yes  SMOKING HX No  Kidney disease No  Any ALLERGIES __________No Known Allergies ____________________________________   Treatment:  Have you taken:  Tylenol Yes  Advil No  Had PT No  Had injection No  Other  _________________________   83 year old female sprained her ankle about 10 days ago comes in complaining of pain over the anterior talofibular ligament.  Initially had trouble weightbearing now able to weight-bear but due to the swelling persisting thought she should come in for x-ray  Denies pain over the fifth metatarsal or medial side  Exam shows swelling and ecchymosis around the ankle and heel with intact Achilles.  Tenderness over the anterior talofibular ligament.  Bony tenderness over the fibula.  No tenderness over the fifth metatarsal or medial malleolus  Recommend imaging  DG Ankle Complete Right Result Date: 12/13/2023 X-ray report Chief complaint pain right ankle Images 3 views Reading: no fx no sft tissue swelling Impression: normal xrays    Encounter Diagnoses  Name Primary?   Pain in right ankle and joints of right foot    Sprain of calcaneofibular ligament of right ankle, initial encounter Yes   Diagnosis ankle sprain Encounter Diagnoses  Name Primary?   Pain in right ankle and joints of right foot    Sprain of calcaneofibular ligament of right ankle, initial encounter Yes   Recommend routine activities follow-up as needed ice, Epsom salt soaks as needed.

## 2023-12-26 ENCOUNTER — Encounter: Payer: Self-pay | Admitting: Diagnostic Neuroimaging

## 2023-12-26 ENCOUNTER — Ambulatory Visit: Payer: Medicare HMO | Admitting: Diagnostic Neuroimaging

## 2023-12-26 ENCOUNTER — Telehealth: Payer: Self-pay | Admitting: Diagnostic Neuroimaging

## 2023-12-26 VITALS — BP 143/96 | HR 72 | Ht 66.0 in | Wt 133.0 lb

## 2023-12-26 DIAGNOSIS — R4689 Other symptoms and signs involving appearance and behavior: Secondary | ICD-10-CM

## 2023-12-26 NOTE — Telephone Encounter (Signed)
 sent to GI they obtain [facility] Berkley Harvey [phone]

## 2023-12-26 NOTE — Patient Instructions (Addendum)
 TRANSIENT MEMORY LOSS SPELL (~Aug 31, 2023; 30 minutes; also mild memory loss / mild cognitive impairment)  - check MRI brain and EEG (TIA, TGA, seizure evaluation)  - caution with medications, living alone; no driving until event free x 6 months  - try to stay active physically and get some exercise (at least 15-30 minutes per day) - eat a nutritious diet with lean protein, plants / vegetables, whole grains; avoid ultra-processed foods - increase social activities, brain stimulation, games, puzzles, hobbies, crafts, arts, music; try new activities; keep it fun! - aim for at least 7-8 hours sleep per night (or more) - avoid smoking and alcohol - safety / supervision issues reviewed

## 2023-12-26 NOTE — Progress Notes (Signed)
 [facility]  PATIENT: Alicia [name] DOB: 01/29/1941  REFERRING CLINICIAN: Benita Stabile, MD HISTORY FROM: patient  REASON FOR VISIT: new consult   HISTORICAL  CHIEF COMPLAINT:  Chief Complaint  Patient presents with   New Patient (Initial Visit)    Pt states that she has had 2 episodes, last one was nov 2024. where she was driving and then didn't remember anything until she pulled in driveway. States overall memory is not good and has to make notes for everything. When she is anxious and on the spot she gets easily flustered. Lives alone and able to complete ADL's and take care of self.     HISTORY OF PRESENT ILLNESS:   83 year old female here for evaluation of transient memory lapse.  Around August 31, 2023 patient was driving on [address] road when all of a sudden she lost memory.  She ended up driving about 20 or 30 minutes to her home but did not remember how she got there.  When she arrived at home she felt back to her baseline.  No sign of damage or injury to the car or herself.  No specific triggering factor.  In 2022 she had an episode when she was going down the stairs to the basement when she slipped, tripped, fell and hit her head.  She was knocked unconscious for a period of time.  She lost about 1 to 2 hours of memory at that time.  Separately patient reports some mild short-term memory problems and recall issues.  However she uses notes, reminders, alarms and is able to take care of her day-to-day activities.  She lives alone.  She takes care of her home and yard without difficulty.  Her daughter lives nearby.       REVIEW OF SYSTEMS: Full 14 system review of systems performed and negative with exception of: As per HPI.  ALLERGIES: No Known Allergies  HOME MEDICATIONS: Outpatient Medications Prior to Visit  Medication Sig Dispense Refill   acetaminophen (TYLENOL) 500 MG tablet Take 250 mg by mouth at bedtime.      bisoprolol-hydrochlorothiazide (ZIAC) 5-6.25 MG tablet Take 2 tablets by mouth daily.     diphenhydramine-acetaminophen (TYLENOL PM) 25-500 MG TABS tablet Take 1 tablet by mouth at bedtime.     Multiple Vitamins-Minerals (PRESERVISION AREDS) CAPS Take 2 capsules by mouth daily.     naproxen sodium (ALEVE) 220 MG tablet Take 220 mg by mouth daily as needed (Pain).     Omega-3 Fatty Acids (OMEGA 3 PO) Take 1,000 mg by mouth daily.     simvastatin (ZOCOR) 20 MG tablet Take 20 mg by mouth at bedtime.     No facility-administered medications prior to visit.    PAST MEDICAL HISTORY: Past Medical History:  Diagnosis Date   Arthritis    back and hips   Dementia (HCC)    mild per patient   Dyspnea    with exertion   GERD (gastroesophageal reflux disease)    TUMS   Hypercholesterolemia    Hypertension    Non Hodgkin's lymphoma (HCC)     PAST SURGICAL HISTORY: Past Surgical History:  Procedure Laterality Date   LYMPH NODE BIOPSY Left 11/27/2017   Procedure: LEFT INGUINAL LYMPH NODE BIOPSY;  Surgeon: Almond Lint, MD;  Location: [facility] OR;  Service: General;  Laterality: Left;   MRI  01/19/2021   with anesthesia at [facility] REMOVAL Left 04/14/2021   Procedure: PORT REMOVAL;  Surgeon: Almond Lint,  MD;  Location: [facility] OR;  Service: General;  Laterality: Left;   PORTACATH PLACEMENT Left 12/12/2017   Procedure: INSERTION PORT-A-CATH;  Surgeon: Almond Lint, MD;  Location: [facility];  Service: General;  Laterality: Left;   TONSILLECTOMY      FAMILY HISTORY: Family History  Problem Relation Age of Onset   Lung cancer Mother    Kidney cancer Father    Alcoholism Brother    Emphysema Maternal Aunt    Cancer Maternal Uncle    Cancer Maternal Uncle     SOCIAL HISTORY: Social History   Socioeconomic History   Marital status: Widowed    Spouse name: Not on file   Number of children: Not on file   Years of education: Not on file   Highest education  level: Not on file  Occupational History   Not on file  Tobacco Use   Smoking status: Never   Smokeless tobacco: Never  Vaping Use   Vaping status: Never Used  Substance and Sexual Activity   Alcohol use: No   Drug use: No   Sexual activity: Not Currently    Birth control/protection: Post-menopausal  Other Topics Concern   Not on file  Social History Narrative   Not on file   Social Drivers of Health   Financial Resource Strain: Low Risk  (03/31/2021)   Overall Financial Resource Strain (CARDIA)    Difficulty of Paying Living Expenses: Not hard at all  Food Insecurity: No Food Insecurity (03/31/2021)   Hunger Vital Sign    Worried About Running Out of Food in the Last Year: Never true    Ran Out of Food in the Last Year: Never true  Transportation Needs: No Transportation Needs (03/31/2021)   PRAPARE - Administrator, Civil Service (Medical): No    Lack of Transportation (Non-Medical): No  Physical Activity: Inactive (03/31/2021)   Exercise Vital Sign    Days of Exercise per Week: 0 days    Minutes of Exercise per Session: 0 min  Stress: No Stress Concern Present (03/31/2021)   [employer] of Occupational Health - Occupational Stress Questionnaire    Feeling of Stress : Only a little  Social Connections: Unknown (02/28/2022)   [facility] from [facility], [facility]   Social Network    Social Network: Not on file  Intimate Partner Violence: Unknown (01/20/2022)   Received from [facility], [facility]   HITS    Physically Hurt: Not on file    Insult or Talk Down To: Not on file    Threaten Physical Harm: Not on file    Scream or Curse: Not on file     PHYSICAL EXAM  GENERAL EXAM/CONSTITUTIONAL: Vitals:  Vitals:   12/26/23 1109  BP: (!) 143/96  Pulse: 72  Weight: 133 lb (60.3 kg)  Height: 5\' 6"  (1.676 m)   Body mass index is 21.47 kg/m. Wt Readings from Last 3 Encounters:  12/26/23 133 lb (60.3 kg)  12/13/23 130 lb (59 kg)  11/14/23  137 lb 5.6 oz (62.3 kg)   Patient is in no distress; well developed, nourished and groomed; neck is supple  CARDIOVASCULAR: Examination of carotid arteries is normal; no carotid bruits Regular rate and rhythm, no murmurs Examination of peripheral vascular system by observation and palpation is normal  EYES: Ophthalmoscopic exam of optic discs and posterior segments is normal; no papilledema or hemorrhages No results found.  MUSCULOSKELETAL: Gait, strength, tone, movements noted in Neurologic exam below  NEUROLOGIC:  MENTAL STATUS:     12/26/2023   11:15 AM  MMSE - Mini Mental State Exam  Orientation to time 5  Orientation to Place 5  Registration 3  Attention/ Calculation 2  Recall 1  Language- name 2 objects 2  Language- repeat 1  Language- follow 3 step command 3  Language- read & follow direction 1  Write a sentence 1  Copy design 1  Total score 25   awake, alert, oriented to person, place and time recent and remote memory intact normal attention and concentration language fluent, comprehension intact, naming intact fund of knowledge appropriate  CRANIAL NERVE:  2nd - no papilledema on fundoscopic exam 2nd, 3rd, 4th, 6th - pupils equal and reactive to light, visual fields full to confrontation, extraocular muscles intact, no nystagmus 5th - facial sensation symmetric 7th - facial strength symmetric 8th - hearing intact 9th - palate elevates symmetrically, uvula midline 11th - shoulder shrug symmetric 12th - tongue protrusion midline  MOTOR:  normal bulk and tone, full strength in the BUE, BLE  SENSORY:  normal and symmetric to light touch, temperature, vibration  COORDINATION:  finger-nose-finger, fine finger movements normal  REFLEXES:  deep tendon reflexes present and symmetric  GAIT/STATION:  narrow based gait     DIAGNOSTIC DATA (LABS, IMAGING, TESTING) - I reviewed patient records, labs, notes, testing and imaging myself where  available.  Lab Results  Component Value Date   WBC 7.4 11/07/2023   HGB 14.1 11/07/2023   HCT 43.0 11/07/2023   MCV 91.5 11/07/2023   PLT 176 11/07/2023      Component Value Date/Time   NA 140 11/07/2023 0740   NA 145 (H) 03/02/2022 0919   K 3.5 11/07/2023 0740   CL 102 11/07/2023 0740   CO2 29 11/07/2023 0740   GLUCOSE 139 (H) 11/07/2023 0740   BUN 16 11/07/2023 0740   BUN 16 03/02/2022 0919   CREATININE 0.85 11/07/2023 0740   CREATININE 0.89 05/01/2018 0747   CALCIUM 9.2 11/07/2023 0740   PROT 6.4 (L) 11/07/2023 0740   ALBUMIN 3.9 11/07/2023 0740   AST 22 11/07/2023 0740   AST 29 05/01/2018 0747   ALT 18 11/07/2023 0740   ALT 25 05/01/2018 0747   ALKPHOS 70 11/07/2023 0740   BILITOT 0.7 11/07/2023 0740   BILITOT 0.4 05/01/2018 0747   GFRNONAA >60 11/07/2023 0740   GFRNONAA >60 05/01/2018 0747   GFRAA >60 06/23/2020 1043   GFRAA >60 05/01/2018 0747   No results found for: "CHOL", "HDL", "LDLCALC", "LDLDIRECT", "TRIG", "CHOLHDL" No results found for: "HGBA1C" No results found for: "VITAMINB12" Lab Results  Component Value Date   TSH 2.070 03/02/2022    09/18/23 CT head [I reviewed images myself and agree with interpretation. -VRP]  - No acute CT finding. Mild age related volume loss. Chronic small-vessel  ischemic changes of the cerebral hemispheric white matter.   ASSESSMENT AND PLAN  83 y.o. year old female here with:   Dx:  1. Spell of abnormal behavior       PLAN:  TRANSIENT MEMORY LOSS SPELL (~Aug 31, 2023; 30 minutes; could have been complex partial seizure, amyloid spell, or transient global amnesia; also having mild subjective memory loss which could be mild cognitive impairment vs normal aging) - check MRI brain and EEG (TIA, TGA, seizure evaluation) - caution with medications, living alone; no driving until event free x 6 months - try to stay active physically and get some exercise (at least 15-30 minutes per day) -  eat a nutritious diet  with lean protein, plants / vegetables, whole grains; avoid ultra-processed foods - increase social activities, brain stimulation, games, puzzles, hobbies, crafts, arts, music; try new activities; keep it fun! - aim for at least 7-8 hours sleep per night (or more) - avoid smoking and alcohol - safety / supervision issues reviewed  Orders Placed This Encounter  Procedures   MR BRAIN W WO CONTRAST   EEG adult   Return for pending test results, pending if symptoms worsen or fail to improve.    Suanne Marker, MD 12/26/2023, 11:47 AM Certified in Neurology, Neurophysiology and Neuroimaging  [facility] [address], [address] [place], [place] [address] [phone]

## 2023-12-27 ENCOUNTER — Ambulatory Visit: Admitting: Diagnostic Neuroimaging

## 2023-12-27 DIAGNOSIS — R4689 Other symptoms and signs involving appearance and behavior: Secondary | ICD-10-CM

## 2023-12-27 DIAGNOSIS — R4182 Altered mental status, unspecified: Secondary | ICD-10-CM | POA: Diagnosis not present

## 2024-01-04 DIAGNOSIS — R7303 Prediabetes: Secondary | ICD-10-CM | POA: Diagnosis not present

## 2024-01-04 DIAGNOSIS — E782 Mixed hyperlipidemia: Secondary | ICD-10-CM | POA: Diagnosis not present

## 2024-01-10 DIAGNOSIS — E782 Mixed hyperlipidemia: Secondary | ICD-10-CM | POA: Diagnosis not present

## 2024-01-10 DIAGNOSIS — R944 Abnormal results of kidney function studies: Secondary | ICD-10-CM | POA: Diagnosis not present

## 2024-01-10 DIAGNOSIS — C8238 Follicular lymphoma grade IIIa, lymph nodes of multiple sites: Secondary | ICD-10-CM | POA: Diagnosis not present

## 2024-01-10 DIAGNOSIS — J849 Interstitial pulmonary disease, unspecified: Secondary | ICD-10-CM | POA: Diagnosis not present

## 2024-01-10 DIAGNOSIS — R911 Solitary pulmonary nodule: Secondary | ICD-10-CM | POA: Diagnosis not present

## 2024-01-10 DIAGNOSIS — R7303 Prediabetes: Secondary | ICD-10-CM | POA: Diagnosis not present

## 2024-01-10 DIAGNOSIS — M79671 Pain in right foot: Secondary | ICD-10-CM | POA: Diagnosis not present

## 2024-01-10 DIAGNOSIS — I10 Essential (primary) hypertension: Secondary | ICD-10-CM | POA: Diagnosis not present

## 2024-01-10 DIAGNOSIS — Z0001 Encounter for general adult medical examination with abnormal findings: Secondary | ICD-10-CM | POA: Diagnosis not present

## 2024-01-10 DIAGNOSIS — F5105 Insomnia due to other mental disorder: Secondary | ICD-10-CM | POA: Diagnosis not present

## 2024-01-10 DIAGNOSIS — H3530 Unspecified macular degeneration: Secondary | ICD-10-CM | POA: Diagnosis not present

## 2024-01-10 DIAGNOSIS — G3184 Mild cognitive impairment, so stated: Secondary | ICD-10-CM | POA: Diagnosis not present

## 2024-01-14 NOTE — Procedures (Signed)
   [facility]  EEG (ELECTROENCEPHALOGRAM) REPORT   STUDY DATE: 12/27/23 PATIENT NAME: Alicia [name] DOB: [birth year] MRN: [id number]  ORDERING CLINICIAN: Joycelyn Schmid, MD   TECHNOLOGIST: Marcheta Grammes TECHNIQUE: Electroencephalogram was recorded utilizing standard 10-20 system of lead placement and reformatted into average and bipolar montages.  RECORDING TIME: 27 minutes ACTIVATION: hyperventilation and photic stimulation  CLINICAL INFORMATION: 82 year old female with abnormal spell.  FINDINGS: Posterior dominant background rhythms, which attenuate with eye opening, ranging 8-9 hertz and 50-60 microvolts. No focal, lateralizing, epileptiform activity or seizures are seen. Patient recorded in the awake and drowsy state. EKG channel shows regular rhythm of 55-60 beats per minute.   IMPRESSION:   Normal EEG in the awake and drowsy states.   INTERPRETING PHYSICIAN:  Suanne Marker, MD Certified in Neurology, Neurophysiology and Neuroimaging  [facility] [address] [place], [place] [address] [phone]

## 2024-01-17 ENCOUNTER — Encounter: Payer: Self-pay | Admitting: Diagnostic Neuroimaging

## 2024-01-30 ENCOUNTER — Encounter: Payer: Self-pay | Admitting: Diagnostic Neuroimaging

## 2024-02-01 ENCOUNTER — Ambulatory Visit
Admission: RE | Admit: 2024-02-01 | Discharge: 2024-02-01 | Disposition: A | Discharge status: Home / Self Care | Source: Ambulatory Visit | Attending: Diagnostic Neuroimaging | Admitting: Diagnostic Neuroimaging

## 2024-02-01 DIAGNOSIS — R4689 Other symptoms and signs involving appearance and behavior: Secondary | ICD-10-CM

## 2024-02-01 MED ORDER — GADOPICLENOL 0.5 MMOL/ML IV SOLN
6.0000 mL | Freq: Once | INTRAVENOUS | Status: AC | PRN
  Administered 2024-02-01: 6 mL via INTRAVENOUS

## 2024-02-04 ENCOUNTER — Encounter: Payer: Self-pay | Admitting: Diagnostic Neuroimaging

## 2024-03-19 DIAGNOSIS — H40011 Open angle with borderline findings, low risk, right eye: Secondary | ICD-10-CM | POA: Diagnosis not present

## 2024-03-31 ENCOUNTER — Ambulatory Visit: Admitting: Orthopedic Surgery

## 2024-03-31 ENCOUNTER — Encounter: Payer: Self-pay | Admitting: Orthopedic Surgery

## 2024-03-31 ENCOUNTER — Other Ambulatory Visit (INDEPENDENT_AMBULATORY_CARE_PROVIDER_SITE_OTHER)

## 2024-03-31 VITALS — BP 148/86 | HR 64 | Ht 66.0 in | Wt 143.0 lb

## 2024-03-31 DIAGNOSIS — M19012 Primary osteoarthritis, left shoulder: Secondary | ICD-10-CM

## 2024-03-31 DIAGNOSIS — M25512 Pain in left shoulder: Secondary | ICD-10-CM

## 2024-03-31 DIAGNOSIS — M7552 Bursitis of left shoulder: Secondary | ICD-10-CM | POA: Diagnosis not present

## 2024-03-31 DIAGNOSIS — M19019 Primary osteoarthritis, unspecified shoulder: Secondary | ICD-10-CM

## 2024-03-31 MED ORDER — METHYLPREDNISOLONE ACETATE 40 MG/ML IJ SUSP: 40.0000 mg | Freq: Once | INTRAMUSCULAR | Status: AC

## 2024-03-31 NOTE — Progress Notes (Signed)
  Intake history:  BP (!) 148/86   Pulse 64   Ht 5' 6 (1.676 m)   Wt 143 lb (64.9 kg)   BMI 23.08 kg/m  Body mass index is 23.08 kg/m.    WHAT ARE WE SEEING YOU FOR TODAY?   Left shoulder  How long has this bothered you? (DOI?DOS?WS?)  1 year / has pain with motion wakes her up at night  Anticoag.  No  Diabetes No  Heart disease No  Hypertension Yes  SMOKING HX No  Kidney disease No  Any ALLERGIES ______________No Known Allergies ________________________________   Treatment:  Have you taken:  Tylenol  Yes half tylenol  at bedtime Aleve before she mows   Advil No  Had PT No  Had injection No  Other  _________________________

## 2024-03-31 NOTE — Progress Notes (Signed)
 Office Visit Note   Patient: Alicia [name]           Date of Birth: 16-Apr-1941           MRN: [id number] Visit Date: 03/31/2024 Requested by: Omie Bickers, MD [address] [place],  [place] [address] PCP: Omie Bickers, MD   Assessment & Plan:   Encounter Diagnoses  Name Primary?   Glenohumeral arthritis Yes   Bursitis of left shoulder     Meds ordered this encounter  Medications   methylPREDNISolone acetate (DEPO-MEDROL) injection 40 mg    83 year old female glenohumeral arthritis and bursitis left shoulder.  Recommend nonoperative treatment with patient education, subacromial injection.  Tylenol  and Aleve for pain  Procedure Procedure note the subacromial injection shoulder left   Verbal consent was obtained to inject the  Left   Shoulder  Timeout was completed to confirm the injection site is a subacromial space of the  left  shoulder  Medication used Depo-Medrol 40 mg and lidocaine  1% 3 cc  Anesthesia was provided by ethyl chloride  The injection was performed in the left  posterior subacromial space. After pinning the skin with alcohol and anesthetized the skin with ethyl chloride the subacromial space was injected using a 20-gauge needle. There were no complications  Sterile dressing was applied.  Return to clinic as needed    Subjective: Chief Complaint  Patient presents with   Shoulder Pain    HPI: 83 year old female 1 year history of left shoulder pain wakes her up at night seems to be activity related no history of rotator cuff injury currently trying Tylenol  and Aleve with some relief of pain  Occasionally the pain runs down the arm into the hand but this is a not a common occurrence              ROS: No evidence of chest pain or shortness of breath   Images personally read and my interpretation : Outside CT scan C-spine report showed multilevel disc disease  Visit Diagnoses:  1. Glenohumeral arthritis   2. Bursitis of left shoulder       Follow-Up Instructions: Return if symptoms worsen or fail to improve.    Objective: Vital Signs: BP (!) 148/86   Pulse 64   Ht 5' 6 (1.676 m)   Wt 143 lb (64.9 kg)   BMI 23.08 kg/m   Physical Exam The patient is awake alert and oriented x 3 mood and affect pleasant  Ortho Exam Left shoulder active forward elevation in the scapular plane 130 degrees passive 150 degrees with pain she has pain at terminal extension of 45 degrees with her arm at her side extension is normal  Cuff strength remains 5 out of 5  No evidence of instability  Normal muscle tone no atrophy  Specialty Comments:  No specialty comments available.  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) 12/20/2017   Counseling regarding advanced  care planning and goals of care 12/20/2017   Past Medical History:  Diagnosis Date   Arthritis    back and hips   Dementia (HCC)    mild per patient   Dyspnea    with exertion   GERD (gastroesophageal reflux disease)    TUMS   Hypercholesterolemia    Hypertension    Non Hodgkin's lymphoma (HCC)     Family History  Problem Relation Age of Onset   Lung cancer Mother    Kidney cancer Father    Alcoholism Brother    Emphysema Maternal Aunt    Cancer Maternal Uncle    Cancer Maternal Uncle     Past Surgical History:  Procedure Laterality Date   LYMPH  NODE BIOPSY Left 11/27/2017   Procedure: LEFT INGUINAL LYMPH NODE BIOPSY;  Surgeon: Lockie Rima, MD;  Location: [facility] OR;  Service: General;  Laterality: Left;   MRI  01/19/2021   with anesthesia at [facility] REMOVAL Left 04/14/2021   Procedure: PORT REMOVAL;  Surgeon: Lockie Rima, MD;  Location: [facility];  Service: General;  Laterality: Left;   PORTACATH PLACEMENT Left 12/12/2017   Procedure: INSERTION PORT-A-CATH;  Surgeon: Lockie Rima, MD;  Location: [facility];  Service: General;  Laterality: Left;   TONSILLECTOMY     Social History   Occupational History   Not on file  Tobacco Use   Smoking status: Never   Smokeless tobacco: Never  Vaping Use   Vaping status: Never Used  Substance and Sexual Activity   Alcohol use: No   Drug use: No   Sexual activity: Not Currently    Birth control/protection: Post-menopausal

## 2024-05-02 ENCOUNTER — Encounter: Payer: Self-pay | Admitting: Advanced Practice Midwife

## 2024-05-07 ENCOUNTER — Ambulatory Visit (HOSPITAL_COMMUNITY)
Admission: RE | Admit: 2024-05-07 | Discharge: 2024-05-07 | Disposition: A | Discharge status: Home / Self Care | Payer: Medicare HMO | Source: Ambulatory Visit | Attending: Hematology | Admitting: Hematology

## 2024-05-07 ENCOUNTER — Inpatient Hospital Stay: Payer: Medicare HMO | Attending: Hematology

## 2024-05-07 DIAGNOSIS — E042 Nontoxic multinodular goiter: Secondary | ICD-10-CM | POA: Diagnosis not present

## 2024-05-07 DIAGNOSIS — Z809 Family history of malignant neoplasm, unspecified: Secondary | ICD-10-CM | POA: Diagnosis not present

## 2024-05-07 DIAGNOSIS — Z8572 Personal history of non-Hodgkin lymphomas: Secondary | ICD-10-CM | POA: Diagnosis not present

## 2024-05-07 DIAGNOSIS — Z8051 Family history of malignant neoplasm of kidney: Secondary | ICD-10-CM | POA: Insufficient documentation

## 2024-05-07 DIAGNOSIS — Z801 Family history of malignant neoplasm of trachea, bronchus and lung: Secondary | ICD-10-CM | POA: Insufficient documentation

## 2024-05-07 DIAGNOSIS — C8238 Follicular lymphoma grade IIIa, lymph nodes of multiple sites: Secondary | ICD-10-CM

## 2024-05-07 DIAGNOSIS — Z79899 Other long term (current) drug therapy: Secondary | ICD-10-CM | POA: Diagnosis not present

## 2024-05-07 DIAGNOSIS — R918 Other nonspecific abnormal finding of lung field: Secondary | ICD-10-CM | POA: Diagnosis not present

## 2024-05-07 DIAGNOSIS — J849 Interstitial pulmonary disease, unspecified: Secondary | ICD-10-CM | POA: Insufficient documentation

## 2024-05-07 DIAGNOSIS — C8290 Follicular lymphoma, unspecified, unspecified site: Secondary | ICD-10-CM | POA: Diagnosis not present

## 2024-05-07 DIAGNOSIS — K5730 Diverticulosis of large intestine without perforation or abscess without bleeding: Secondary | ICD-10-CM | POA: Diagnosis not present

## 2024-05-07 DIAGNOSIS — I700 Atherosclerosis of aorta: Secondary | ICD-10-CM | POA: Diagnosis not present

## 2024-05-07 LAB — CBC WITH DIFFERENTIAL/PLATELET
Abs Immature Granulocytes: 0.04 K/uL (ref 0.00–0.07)
Basophils Absolute: 0.1 K/uL (ref 0.0–0.1)
Basophils Relative: 1 %
Eosinophils Absolute: 0.1 K/uL (ref 0.0–0.5)
Eosinophils Relative: 1 %
HCT: 41.6 % (ref 36.0–46.0)
Hemoglobin: 13.8 g/dL (ref 12.0–15.0)
Immature Granulocytes: 1 %
Lymphocytes Relative: 32 %
Lymphs Abs: 2.7 K/uL (ref 0.7–4.0)
MCH: 30.4 pg (ref 26.0–34.0)
MCHC: 33.2 g/dL (ref 30.0–36.0)
MCV: 91.6 fL (ref 80.0–100.0)
Monocytes Absolute: 0.7 K/uL (ref 0.1–1.0)
Monocytes Relative: 8 %
Neutro Abs: 4.9 K/uL (ref 1.7–7.7)
Neutrophils Relative %: 57 %
Platelets: 172 K/uL (ref 150–400)
RBC: 4.54 MIL/uL (ref 3.87–5.11)
RDW: 14.2 % (ref 11.5–15.5)
WBC: 8.6 K/uL (ref 4.0–10.5)
nRBC: 0 % (ref 0.0–0.2)

## 2024-05-07 LAB — COMPREHENSIVE METABOLIC PANEL WITH GFR
ALT: 17 U/L (ref 0–44)
AST: 21 U/L (ref 15–41)
Albumin: 3.8 g/dL (ref 3.5–5.0)
Alkaline Phosphatase: 70 U/L (ref 38–126)
Anion gap: 10 (ref 5–15)
BUN: 14 mg/dL (ref 8–23)
CO2: 29 mmol/L (ref 22–32)
Calcium: 9.5 mg/dL (ref 8.9–10.3)
Chloride: 103 mmol/L (ref 98–111)
Creatinine, Ser: 0.95 mg/dL (ref 0.44–1.00)
GFR, Estimated: 59 mL/min — ABNORMAL LOW (ref >60)
Glucose, Bld: 97 mg/dL (ref 70–99)
Potassium: 3.4 mmol/L — ABNORMAL LOW (ref 3.5–5.1)
Sodium: 142 mmol/L (ref 135–145)
Total Bilirubin: 0.6 mg/dL (ref 0.0–1.2)
Total Protein: 6.3 g/dL — ABNORMAL LOW (ref 6.5–8.1)

## 2024-05-07 LAB — LACTATE DEHYDROGENASE: LDH: 147 U/L (ref 98–192)

## 2024-05-07 MED ORDER — IOHEXOL 9 MG/ML PO SOLN
ORAL | Status: AC
  Filled 2024-05-07: qty 1000

## 2024-05-07 MED ORDER — IOHEXOL 300 MG/ML  SOLN
125.0000 mL | Freq: Once | INTRAMUSCULAR | Status: AC | PRN
  Administered 2024-05-07: 125 mL via INTRAVENOUS

## 2024-05-13 NOTE — Progress Notes (Signed)
 [name] [address], [place] [address]    Clinic Day:  05/14/2024  Referring physician: Shona Norleen PEDLAR, MD  Patient Care Team: Shona Norleen PEDLAR, MD as PCP - General (Internal Medicine) Alicia Hai, MD as Medical Oncologist (Medical Oncology)   ASSESSMENT & PLAN:   Assessment: 1.  High-grade follicular lymphoma: -Presentation with left iliac lymph node enlargement, no B symptoms, initial node biopsy on 10/19/2016 consistent with non-Hodgkin's lymphoma. -Excisional biopsy on 11/27/2017 shows high-grade follicular lymphoma (grade 3A). -4 cycles of R-CHOP from 12/28/2017 through 02/28/2018.  She had difficulty breathing after second and third treatments. -Maintenance rituximab  from 05/01/2018 through 03/05/2020. -CT CAP on 06/29/2020 shows resolution of right middle lobe pulmonary nodule.  No abdominal or pelvic adenopathy.  No findings of lymphoma.   2.  Interstitial lung disease: -Evaluated by pulmonary and was thought to have interstitial lung disease. -Mild dyspnea on exertion is stable. -Recent CT chest on 12/31/2019 showed nonspecific lung nodules measuring up to 4 mm.  New small nodule in the right middle lobe measuring 3 mm. -CT on 06/29/2020 with resolution of right middle lobe lung nodule.    Plan: 1.  High-grade follicular lymphoma: - Denies any fevers, night sweats or weight loss in the last 6 months.  Denies any infections. - Reviewed labs from 05/07/2024: Normal LFTs and CBC.  LDH was normal. - Reviewed CT soft tissue neck from 05/07/2024: No lymphadenopathy.  Other benign findings were discussed. - CT CAP (05/07/2024): No evidence of lymphoma recurrence.  Slight worsening of interstitial lung disease.  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 - No evidence of recurrence of lymphoma.  We will switch her follow-up visit to 1 year with labs.  No routine imaging necessary moving forward.   2.   Interstitial lung disease: - She reports slight worsening of dyspnea on exertion.  She was told to follow-up with Dr. Geronimo.  3.  Aneurysm of the distal aortic arch: - Will refer to vascular surgery for continued follow-up.    Orders Placed This Encounter  Procedures   CBC with Differential    Standing Status:   Future    Expiration Date:   05/14/2025   Comprehensive metabolic panel    Standing Status:   Future    Expiration Date:   05/14/2025   Lactate dehydrogenase    Standing Status:   Future    Expiration Date:   05/14/2025      Alicia Alicia,acting as a scribe for Alicia Rogers, MD.,have documented all relevant documentation on the behalf of Alicia Rogers, MD,as directed by  Alicia Rogers, MD while in the presence of Alicia Rogers, MD.  I, Alicia Rogers MD, have reviewed the above documentation for accuracy and completeness, and I agree with the above.    Alicia Rogers, MD   7/30/20258:26 AM  CHIEF COMPLAINT:   Diagnosis: high-grade follicular lymphoma    Cancer Staging  No matching staging information was found for the patient.    Prior Therapy: 1. R-CHOP x 4 cycles from 12/28/2017 to 02/28/2018. 2. Maintenance rituximab  from 05/01/2018 to 03/05/2020.  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 female presenting to clinic today for follow up of high-grade follicular lymphoma. She was last seen by me on 11/14/2023.  Since her last visit, she underwent CT CAP on 05/07/2024 that found: No lymphadenopathy or other evidence of recurrent lymphoma in the  chest, abdomen, or pelvis. Normal spleen size. Dependent bibasilar varicoid bronchiectasis, irregular interstitial opacity and interlobular septal thickening without clear evidence of subpleural bronchiolectasis or honeycombing, slightly worsened when compared to prior examination and clearly worsened over a longer period of time. Findings are suspicious for fibrotic interstitial lung disease although could reflect chronic, progressing sequelae of ongoing infection or aspiration. Nonobstructive left nephrolithiasis. Descending and sigmoid diverticulosis without evidence of acute diverticulitis. 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  CT soft tissue neck done on 7/23/2-25 showed: No lymphadenopathy within the neck to suggest recurrent lymphoma. Moderate left sphenoid sinusitis. Cervical spondylosis. Aortic Atherosclerosis.  Today, she states that she is doing well overall. Her appetite level is at 100%. Her energy level is at 50%.  PAST MEDICAL HISTORY:   Past Medical History: Past Medical History:  Diagnosis Date   Arthritis    back and hips   Dementia (HCC)    mild per patient   Dyspnea    with exertion   GERD (gastroesophageal reflux disease)    TUMS   Hypercholesterolemia    Hypertension    Non Hodgkin's lymphoma (HCC)     Surgical History: Past Surgical History:  Procedure Laterality Date   LYMPH NODE BIOPSY Left 11/27/2017   Procedure: LEFT INGUINAL LYMPH NODE BIOPSY;  Surgeon: Aron Shoulders, MD;  Location: [facility] OR;  Service: General;  Laterality: Left;   MRI  01/19/2021   with anesthesia at [facility] REMOVAL Left 04/14/2021   Procedure: PORT REMOVAL;  Surgeon: Aron Shoulders, MD;  Location: [facility] OR;  Service: General;  Laterality: Left;   PORTACATH PLACEMENT Left 12/12/2017   Procedure: INSERTION PORT-A-CATH;  Surgeon: Aron Shoulders, MD;  Location: [facility];   Service: General;  Laterality: Left;   TONSILLECTOMY      Social History: Social History   Socioeconomic History   Marital status: Widowed    Spouse name: Not on file   Number of children: Not on file   Years of education: Not on file   Highest education level: Not on file  Occupational History   Not on file  Tobacco Use   Smoking status: Never   Smokeless tobacco: Never  Vaping Use   Vaping status: Never Used  Substance and Sexual Activity   Alcohol use: No   Drug use: No   Sexual activity: Not Currently    Birth control/protection: Post-menopausal  Other Topics Concern   Not on file  Social History Narrative   Not on file   Social Drivers of Health   Financial Resource Strain: Low Risk  (03/31/2021)   Overall Financial Resource Strain (CARDIA)    Difficulty of Paying Living Expenses: Not hard at all  Food Insecurity: No Food Insecurity (03/31/2021)   Hunger Vital Sign    Worried About Running Out of Food in the Last Year: Never true    Ran Out of Food in the Last Year: Never true  Transportation Needs: No Transportation Needs (03/31/2021)   PRAPARE - Transportation  Lack of Transportation (Medical): No    Lack of Transportation (Non-Medical): No  Physical Activity: Inactive (03/31/2021)   Exercise Vital Sign    Days of Exercise per Week: 0 days    Minutes of Exercise per Session: 0 min  Stress: No Stress Concern Present (03/31/2021)   [employer] of Occupational Health - Occupational Stress Questionnaire    Feeling of Stress : Only a little  Social Connections: Unknown (02/28/2022)   Received from [facility]   Social Network    Social Network: Not on file  Intimate Partner Violence: Unknown (01/20/2022)   Received from [facility]   HITS    Physically Hurt: Not on file    Insult or Talk Down To: Not on file    Threaten Physical Harm: Not on file    Scream or Curse: Not on file    Family History: Family History  Problem Relation Age of Onset    Lung cancer Mother    Kidney cancer Father    Alcoholism Brother    Emphysema Maternal Aunt    Cancer Maternal Uncle    Cancer Maternal Uncle     Current Medications:  Current Outpatient Medications:    acetaminophen  (TYLENOL ) 500 MG tablet, Take 250 mg by mouth at bedtime., Disp: , Rfl:    bisoprolol -hydrochlorothiazide (ZIAC) 5-6.25 MG tablet, Take 2 tablets by mouth daily., Disp: , Rfl:    diphenhydramine -acetaminophen  (TYLENOL  PM) 25-500 MG TABS tablet, Take 1 tablet by mouth at bedtime., Disp: , Rfl:    Multiple Vitamins-Minerals (PRESERVISION AREDS) CAPS, Take 2 capsules by mouth daily., Disp: , Rfl:    naproxen sodium (ALEVE) 220 MG tablet, Take 220 mg by mouth daily as needed (Pain)., Disp: , Rfl:    simvastatin  (ZOCOR ) 20 MG tablet, Take 20 mg by mouth at bedtime., Disp: , Rfl:    Allergies: No Known Allergies  REVIEW OF SYSTEMS:   Review of Systems  Constitutional:  Negative for chills, fatigue and fever.  HENT:   Negative for lump/mass, mouth sores, nosebleeds, sore throat and trouble swallowing.   Eyes:  Negative for eye problems.  Respiratory:  Positive for shortness of breath. Negative for cough.   Cardiovascular:  Negative for chest pain, leg swelling and palpitations.  Gastrointestinal:  Negative for abdominal pain, constipation, diarrhea, nausea and vomiting.  Genitourinary:  Negative for bladder incontinence, difficulty urinating, dysuria, frequency, hematuria and nocturia.   Musculoskeletal:  Negative for arthralgias, back pain, flank pain, myalgias and neck pain.  Skin:  Negative for itching and rash.  Neurological:  Negative for dizziness, headaches and numbness.  Hematological:  Does not bruise/bleed easily.  Psychiatric/Behavioral:  Positive for sleep disturbance. Negative for depression and suicidal ideas. The patient is not nervous/anxious.   All other systems reviewed and are negative.    VITALS:   Blood pressure 125/72, pulse 65, temperature 97.6 F  (36.4 C), temperature source Oral, resp. rate 16, weight 132 lb 15 oz (60.3 kg), SpO2 98%.  Wt Readings from Last 3 Encounters:  05/14/24 132 lb 15 oz (60.3 kg)  03/31/24 143 lb (64.9 kg)  12/26/23 133 lb (60.3 kg)    Body mass index is 21.46 kg/m.  Performance status (ECOG): 1 - Symptomatic but completely ambulatory  PHYSICAL EXAM:   Physical Exam Vitals and nursing note reviewed. Exam conducted with a chaperone present.  Constitutional:      Appearance: Normal appearance.  Cardiovascular:     Rate and Rhythm: Normal rate and regular rhythm.  Pulses: Normal pulses.     Heart sounds: Normal heart sounds.  Pulmonary:     Effort: Pulmonary effort is normal.     Breath sounds: Normal breath sounds.  Abdominal:     Palpations: Abdomen is soft. There is no hepatomegaly, splenomegaly or mass.     Tenderness: There is no abdominal tenderness.  Musculoskeletal:     Right lower leg: No edema.     Left lower leg: No edema.  Lymphadenopathy:     Cervical: No cervical adenopathy.     Right cervical: No superficial, deep or posterior cervical adenopathy.    Left cervical: No superficial, deep or posterior cervical adenopathy.     Upper Body:     Right upper body: No supraclavicular or axillary adenopathy.     Left upper body: No supraclavicular or axillary adenopathy.  Neurological:     General: No focal deficit present.     Mental Status: She is alert and oriented to person, place, and time.  Psychiatric:        Mood and Affect: Mood normal.        Behavior: Behavior normal.     LABS:   CBC     Component Value Date/Time   WBC 8.6 05/07/2024 0755   RBC 4.54 05/07/2024 0755   HGB 13.8 05/07/2024 0755   HGB 11.6 02/28/2018 0824   HCT 41.6 05/07/2024 0755   PLT 172 05/07/2024 0755   PLT 220 02/28/2018 0824   MCV 91.6 05/07/2024 0755   MCH 30.4 05/07/2024 0755   MCHC 33.2 05/07/2024 0755   RDW 14.2 05/07/2024 0755   LYMPHSABS 2.7 05/07/2024 0755   MONOABS 0.7  05/07/2024 0755   EOSABS 0.1 05/07/2024 0755   BASOSABS 0.1 05/07/2024 0755    CMP      Component Value Date/Time   NA 142 05/07/2024 0755   NA 145 (H) 03/02/2022 0919   K 3.4 (L) 05/07/2024 0755   CL 103 05/07/2024 0755   CO2 29 05/07/2024 0755   GLUCOSE 97 05/07/2024 0755   BUN 14 05/07/2024 0755   BUN 16 03/02/2022 0919   CREATININE 0.95 05/07/2024 0755   CREATININE 0.89 05/01/2018 0747   CALCIUM 9.5 05/07/2024 0755   PROT 6.3 (L) 05/07/2024 0755   ALBUMIN 3.8 05/07/2024 0755   AST 21 05/07/2024 0755   AST 29 05/01/2018 0747   ALT 17 05/07/2024 0755   ALT 25 05/01/2018 0747   ALKPHOS 70 05/07/2024 0755   BILITOT 0.6 05/07/2024 0755   BILITOT 0.4 05/01/2018 0747   GFRNONAA 59 (L) 05/07/2024 0755   GFRNONAA >60 05/01/2018 0747   GFRAA >60 06/23/2020 1043   GFRAA >60 05/01/2018 0747     No results found for: CEA1, CEA / No results found for: CEA1, CEA No results found for: PSA1 No results found for: CAN199 No results found for: CAN125  No results found for: TOTALPROTELP, ALBUMINELP, A1GS, A2GS, BETS, BETA2SER, GAMS, MSPIKE, SPEI No results found for: TIBC, FERRITIN, IRONPCTSAT Lab Results  Component Value Date   LDH 147 05/07/2024   LDH 145 11/07/2023   LDH 158 05/02/2023     STUDIES:   CT SOFT TISSUE NECK W CONTRAST Result Date: 05/13/2024 CLINICAL DATA:  Provided history: Hematologic malignancy, monitor. Additional history provided: Grade 3a follicular lymphoma of lymph nodes of multiple regions. EXAM: CT NECK WITH CONTRAST TECHNIQUE: Multidetector CT imaging of the neck was performed using the standard protocol following the bolus administration of intravenous contrast. RADIATION DOSE REDUCTION:  This exam was performed according to the departmental dose-optimization program which includes automated exposure control, adjustment of the mA and/or kV according to patient size and/or use of iterative reconstruction technique.  CONTRAST:  OMNIPAQUE  IOHEXOL  300 MG/ML  SOLN COMPARISON:  Neck CT 05/02/2023. FINDINGS: Pharynx and larynx: Streak/beam hardening artifact arising from dental restoration partially obscures the oral cavity. No appreciable swelling or mass within the oral cavity, pharynx or larynx. Closed glottis at the time of imaging. Salivary glands: No inflammation, mass, or stone. Thyroid : Subcentimeter thyroid  nodules (some calcified), not meeting consensus criteria for ultrasound follow-up based on size. No follow-up imaging recommended. Reference: J Am Coll Radiol. 2015 Feb;12(2): 143-50. Lymph nodes: No pathologically enlarged lymph nodes identified within the neck. Vascular: The major vascular structures of the neck are patent. Atherosclerotic plaque within the aortic arch, proximal major branch vessels of the neck and within the intracranial internal carotid arteries. Tortuosity and partially retropharyngeal course of the cervical right ICA. Limited intracranial: No evidence of an acute intracranial abnormality within the field of view. Visualized orbits: No orbital mass or acute orbital finding. Mastoids and visualized paranasal sinuses: Portions of the frontal sinuses are excluded from the field of view superiorly. Moderate left sphenoid sinusitis. No significant mastoid effusion. Skeleton: Nonspecific reversal of the expected cervical lordosis. Grade 1 anterolisthesis at C2-C3, C3-C4 and C4-C5. Cervical spondylosis. No acute fracture or aggressive osseous lesion. Upper chest: No consolidation within the imaged lung apices. IMPRESSION: 1. No lymphadenopathy within the neck to suggest recurrent lymphoma. 2. Moderate left sphenoid sinusitis. 3. Cervical spondylosis. 4. Aortic Atherosclerosis (ICD10-I70.0). Electronically Signed   By: Rockey Childs D.O.   On: 05/13/2024 10:25   CT CHEST ABDOMEN PELVIS W CONTRAST Result Date: 05/13/2024 CLINICAL DATA:  Hematologic malignancy, follicular lymphoma restaging * Tracking  Code: BO * EXAM: CT CHEST, ABDOMEN, AND PELVIS WITH CONTRAST TECHNIQUE: Multidetector CT imaging of the chest, abdomen and pelvis was performed following the standard protocol during bolus administration of intravenous contrast. RADIATION DOSE REDUCTION: This exam was performed according to the departmental dose-optimization program which includes automated exposure control, adjustment of the mA and/or kV according to patient size and/or use of iterative reconstruction technique. CONTRAST:  125mL OMNIPAQUE  IOHEXOL  300 MG/ML SOLN additional oral enteric contrast COMPARISON:  05/02/2023 FINDINGS: CT CHEST FINDINGS Cardiovascular: Aortic atherosclerosis. 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 Normal heart size. No pericardial effusion. Mediastinum/Nodes: No enlarged mediastinal, hilar, or axillary lymph nodes. Thyroid  gland, trachea, and esophagus demonstrate no significant findings. Lungs/Pleura: Dependent bibasilar varicoid bronchiectasis, irregular interstitial opacity and interlobular septal thickening without clear evidence of subpleural bronchiolectasis or honeycombing, slightly worsened when compared to prior examination and clearly worsened over a longer period of time. No pleural effusion or pneumothorax. Musculoskeletal: No chest wall abnormality. No acute osseous findings. CT ABDOMEN PELVIS FINDINGS Hepatobiliary: No solid liver abnormality is seen. No gallstones, gallbladder wall thickening, or biliary dilatation. Pancreas: Unremarkable. No pancreatic ductal dilatation or surrounding inflammatory changes. Spleen: Normal in size without significant abnormality. Adrenals/Urinary Tract: Adrenal glands are unremarkable. Simple renal cortical and parapelvic cysts, benign, for which no further follow-up or characterization is required. Small nonobstructive calculus of the anterior midportion of the left kidney. No right-sided  calculi, ureteral calculi, or hydronephrosis. Bladder is unremarkable. Stomach/Bowel: Stomach is within normal limits. Appendix appears normal. No evidence of bowel wall thickening, distention, or inflammatory changes. Descending and sigmoid diverticulosis. Vascular/Lymphatic: Aortic atherosclerosis. No enlarged  abdominal or pelvic lymph nodes. Reproductive: No mass or other abnormality. Calcified uterine fibroid. Other: No abdominal wall hernia or abnormality. No ascites. Musculoskeletal: No acute osseous findings. IMPRESSION: 1. No lymphadenopathy or other evidence of recurrent lymphoma in the chest, abdomen, or pelvis. Normal spleen size. 2. Dependent bibasilar varicoid bronchiectasis, irregular interstitial opacity and interlobular septal thickening without clear evidence of subpleural bronchiolectasis or honeycombing, slightly worsened when compared to prior examination and clearly worsened over a longer period of time. Findings are suspicious for fibrotic interstitial lung disease although could reflect chronic, progressing sequelae of ongoing infection or aspiration. Consider pulmonary referral and dedicated interstitial lung disease protocol CT to further assess if indicated by clinical suspicion. 3. Nonobstructive left nephrolithiasis. 4. Descending and sigmoid diverticulosis without evidence of acute diverticulitis. 5. 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 Attention on follow-up. Aortic Atherosclerosis (ICD10-I70.0). Electronically Signed   By: Marolyn JONETTA Jaksch M.D.   On: 05/13/2024 10:21

## 2024-05-14 ENCOUNTER — Inpatient Hospital Stay (HOSPITAL_BASED_OUTPATIENT_CLINIC_OR_DEPARTMENT_OTHER): Payer: Medicare HMO | Admitting: Hematology

## 2024-05-14 VITALS — BP 125/72 | HR 65 | Temp 97.6°F | Resp 16 | Wt 132.9 lb

## 2024-05-14 DIAGNOSIS — Z8572 Personal history of non-Hodgkin lymphomas: Secondary | ICD-10-CM | POA: Diagnosis not present

## 2024-05-14 DIAGNOSIS — C8238 Follicular lymphoma grade IIIa, lymph nodes of multiple sites: Secondary | ICD-10-CM

## 2024-05-14 DIAGNOSIS — Z79899 Other long term (current) drug therapy: Secondary | ICD-10-CM | POA: Diagnosis not present

## 2024-05-14 DIAGNOSIS — J849 Interstitial pulmonary disease, unspecified: Secondary | ICD-10-CM | POA: Diagnosis not present

## 2024-05-14 DIAGNOSIS — Z801 Family history of malignant neoplasm of trachea, bronchus and lung: Secondary | ICD-10-CM | POA: Diagnosis not present

## 2024-05-14 DIAGNOSIS — Z809 Family history of malignant neoplasm, unspecified: Secondary | ICD-10-CM | POA: Diagnosis not present

## 2024-05-14 DIAGNOSIS — Z8051 Family history of malignant neoplasm of kidney: Secondary | ICD-10-CM | POA: Diagnosis not present

## 2024-05-14 NOTE — Patient Instructions (Addendum)
 [facility] at [facility] Discharge Instructions   You were seen and examined today by Dr. Rogers.  He reviewed the results of your lab work which are normal/stable.   He reviewed the results of your CT scans which did not show any evidence of lymphoma.   We will see you back in 1 year. We will repeat lab work prior to this visit.    Return as scheduled.    Thank you for choosing [facility] at [facility] to provide your oncology and hematology care.  To afford each patient quality time with our provider, please arrive at least 15 minutes before your scheduled appointment time.   If you have a lab appointment with the Cancer Center please come in thru the Main Entrance and check in at the main information desk.  You need to re-schedule your appointment should you arrive 10 or more minutes late.  We strive to give you quality time with our providers, and arriving late affects you and other patients whose appointments are after yours.  Also, if you no show three or more times for appointments you may be dismissed from the clinic at the providers discretion.     Again, thank you for choosing [facility].  Our hope is that these requests will decrease the amount of time that you wait before being seen by our physicians.       _____________________________________________________________  Should you have questions after your visit to [facility], please contact our office at [phone] and follow the prompts.  Our office hours are 8:00 a.m. and 4:30 p.m. Monday - Friday.  Please note that voicemails left after 4:00 p.m. may not be returned until the following business day.  We are closed weekends and major holidays.  You do have access to a nurse 24-7, just call the main number to the clinic [phone] and do not press any options, hold on the line and a nurse will answer the phone.    For prescription refill  requests, have your pharmacy contact our office and allow 72 hours.    Due to Covid, you will need to wear a mask upon entering the hospital. If you do not have a mask, a mask will be given to you at the Main Entrance upon arrival. For doctor visits, patients may have 1 support person age 63 or older with them. For treatment visits, patients can not have anyone with them due to social distancing guidelines and our immunocompromised population.

## 2024-06-06 ENCOUNTER — Encounter: Payer: Self-pay | Admitting: Hematology

## 2024-06-06 NOTE — Progress Notes (Signed)
 Erroneous encounter

## 2024-07-08 DIAGNOSIS — E782 Mixed hyperlipidemia: Secondary | ICD-10-CM | POA: Diagnosis not present

## 2024-07-08 DIAGNOSIS — R7303 Prediabetes: Secondary | ICD-10-CM | POA: Diagnosis not present

## 2024-07-11 DIAGNOSIS — M19012 Primary osteoarthritis, left shoulder: Secondary | ICD-10-CM | POA: Insufficient documentation

## 2024-07-14 DIAGNOSIS — C8238 Follicular lymphoma grade IIIa, lymph nodes of multiple sites: Secondary | ICD-10-CM | POA: Diagnosis not present

## 2024-07-14 DIAGNOSIS — G3184 Mild cognitive impairment, so stated: Secondary | ICD-10-CM | POA: Diagnosis not present

## 2024-07-14 DIAGNOSIS — R809 Proteinuria, unspecified: Secondary | ICD-10-CM | POA: Diagnosis not present

## 2024-07-14 DIAGNOSIS — I712 Thoracic aortic aneurysm, without rupture, unspecified: Secondary | ICD-10-CM | POA: Diagnosis not present

## 2024-07-14 DIAGNOSIS — I700 Atherosclerosis of aorta: Secondary | ICD-10-CM | POA: Diagnosis not present

## 2024-07-14 DIAGNOSIS — R7303 Prediabetes: Secondary | ICD-10-CM | POA: Diagnosis not present

## 2024-07-14 DIAGNOSIS — R944 Abnormal results of kidney function studies: Secondary | ICD-10-CM | POA: Diagnosis not present

## 2024-07-14 DIAGNOSIS — E782 Mixed hyperlipidemia: Secondary | ICD-10-CM | POA: Diagnosis not present

## 2024-07-14 DIAGNOSIS — G4700 Insomnia, unspecified: Secondary | ICD-10-CM | POA: Diagnosis not present

## 2024-07-14 DIAGNOSIS — I10 Essential (primary) hypertension: Secondary | ICD-10-CM | POA: Diagnosis not present

## 2024-07-14 DIAGNOSIS — H3530 Unspecified macular degeneration: Secondary | ICD-10-CM | POA: Diagnosis not present

## 2024-07-14 DIAGNOSIS — J849 Interstitial pulmonary disease, unspecified: Secondary | ICD-10-CM | POA: Diagnosis not present

## 2024-07-29 ENCOUNTER — Encounter: Admitting: Vascular Surgery

## 2024-09-27 ENCOUNTER — Emergency Department (HOSPITAL_COMMUNITY)

## 2024-09-27 ENCOUNTER — Other Ambulatory Visit: Payer: Self-pay

## 2024-09-27 ENCOUNTER — Encounter (HOSPITAL_COMMUNITY): Payer: Self-pay

## 2024-09-27 ENCOUNTER — Emergency Department (HOSPITAL_COMMUNITY)
Admission: EM | Admit: 2024-09-27 | Discharge: 2024-09-27 | Disposition: A | Discharge status: Home / Self Care | Attending: Emergency Medicine | Admitting: Emergency Medicine

## 2024-09-27 DIAGNOSIS — Z85118 Personal history of other malignant neoplasm of bronchus and lung: Secondary | ICD-10-CM | POA: Diagnosis not present

## 2024-09-27 DIAGNOSIS — R0602 Shortness of breath: Secondary | ICD-10-CM | POA: Diagnosis not present

## 2024-09-27 DIAGNOSIS — R0600 Dyspnea, unspecified: Secondary | ICD-10-CM

## 2024-09-27 LAB — BASIC METABOLIC PANEL WITH GFR
Anion gap: 16 — ABNORMAL HIGH (ref 5–15)
BUN: 21 mg/dL (ref 8–23)
CO2: 22 mmol/L (ref 22–32)
Calcium: 9.9 mg/dL (ref 8.9–10.3)
Chloride: 101 mmol/L (ref 98–111)
Creatinine, Ser: 0.89 mg/dL (ref 0.44–1.00)
GFR, Estimated: >60 mL/min (ref >60)
Glucose, Bld: 145 mg/dL — ABNORMAL HIGH (ref 70–99)
Potassium: 3.4 mmol/L — ABNORMAL LOW (ref 3.5–5.1)
Sodium: 139 mmol/L (ref 135–145)

## 2024-09-27 LAB — CBC
HCT: 41.8 % (ref 36.0–46.0)
Hemoglobin: 13.8 g/dL (ref 12.0–15.0)
MCH: 29.9 pg (ref 26.0–34.0)
MCHC: 33 g/dL (ref 30.0–36.0)
MCV: 90.7 fL (ref 80.0–100.0)
Platelets: 156 K/uL (ref 150–400)
RBC: 4.61 MIL/uL (ref 3.87–5.11)
RDW: 13.3 % (ref 11.5–15.5)
WBC: 10.4 K/uL (ref 4.0–10.5)
nRBC: 0 % (ref 0.0–0.2)

## 2024-09-27 NOTE — Discharge Instructions (Signed)
 Follow-up with your doctor if any problems.

## 2024-09-27 NOTE — ED Provider Notes (Signed)
 [place] EMERGENCY DEPARTMENT AT [facility] Provider Note   CSN: [id number] Arrival date & time: 09/27/24  1033     Patient presents with: Shortness of Breath   Alicia [name] is a 83 y.o. female.  {Add pertinent medical, surgical, social history, OB history to YEP:67052} Patient states her blood pressure was elevated and she became short of breath.  She was at [employer].  Patient has a history of lung cancer   Shortness of Breath      Prior to Admission medications  Medication Sig Start Date End Date Taking? Authorizing Provider  acetaminophen  (TYLENOL ) 500 MG tablet Take 250 mg by mouth at bedtime.    [provider]  bisoprolol -hydrochlorothiazide (ZIAC) 5-6.25 MG tablet Take 2 tablets by mouth daily.    [provider]  diphenhydramine -acetaminophen  (TYLENOL  PM) 25-500 MG TABS tablet Take 1 tablet by mouth at bedtime.    [provider]  Multiple Vitamins-Minerals (PRESERVISION AREDS) CAPS Take 2 capsules by mouth daily.    [provider]  naproxen sodium (ALEVE) 220 MG tablet Take 220 mg by mouth daily as needed (Pain).    [provider]  simvastatin  (ZOCOR ) 20 MG tablet Take 20 mg by mouth at bedtime. 02/08/21   [provider]    Allergies: Patient has no known allergies.    Review of Systems  Respiratory:  Positive for shortness of breath.     Updated Vital Signs BP (!) 150/108   Pulse 70   Temp (!) 97.1 F (36.2 C) (Axillary)   Resp 20   Ht 5' 6 (1.676 m)   Wt 61.2 kg   SpO2 98%   BMI 21.79 kg/m   Physical Exam  (all labs ordered are listed, but only abnormal results are displayed) Labs Reviewed  BASIC METABOLIC PANEL WITH GFR - Abnormal; Notable for the following components:      Result Value   Potassium 3.4 (*)    Glucose, Bld 145 (*)    Anion gap 16 (*)    All other components within normal limits  CBC    EKG: EKG Interpretation Date/Time:  Saturday September 27 2024 10:50:08 EST Ventricular Rate:  69 PR Interval:    QRS Duration:  101 QT Interval:  408 QTC Calculation: 438 R Axis:   -53  Text Interpretation: Atrial fibrillation Left anterior fascicular block Abnormal R-wave progression, early transition Left ventricular hypertrophy ST elevation, consider inferior injury Artifact in lead(s) I II III aVR aVL Confirmed by Suzette Pac [phone]) on 09/27/2024 12:57:09 PM  Radiology: [facility] Chest 2 View Result Date: 09/27/2024 CLINICAL DATA:  Shortness of breath. EXAM: CHEST - 2 VIEW COMPARISON:  04/29/2022 FINDINGS: The lungs are clear without focal pneumonia, edema, pneumothorax or pleural effusion. The cardiopericardial silhouette is within normal limits for size. No acute bony abnormality. Telemetry leads overlie the chest. IMPRESSION: No active cardiopulmonary disease. Electronically Signed   By: Camellia Candle M.D.   On: 09/27/2024 11:26    {Document cardiac monitor, telemetry assessment procedure when appropriate:32947} Procedures   Medications Ordered in the ED - No data to display    {Click here for ABCD2, HEART and other calculators REFRESH Note before signing:1}                              Medical Decision Making Amount and/or Complexity of Data Reviewed Labs: ordered. Radiology: ordered.   Dyspnea and hypertension most likely related  to stress.  This has resolved and the patient feels much better.  Labs and chest x-ray and EKG unremarkable.  She will be discharged home and follow-up with PCP if she needs  {Document critical care time when appropriate  Document review of labs and clinical decision tools ie CHADS2VASC2, etc  Document your independent review of radiology images and any outside records  Document your discussion with family members, caretakers and with consultants  Document social determinants of health affecting pt's care  Document your decision making why or why not admission, treatments were needed:32947:::1}    Final diagnoses:  Dyspnea, unspecified type    ED Discharge Orders     None

## 2024-09-27 NOTE — ED Triage Notes (Signed)
 Pt reports SHOB starting around 1000. Pt denies being around anyone sick she is aware. Pt reports since her lung cancer hx she started having SHOB episodes. Pt very anxious during triage. Pts color WDL. Oxygen  97% RA.

## 2024-10-01 DIAGNOSIS — Z6823 Body mass index (BMI) 23.0-23.9, adult: Secondary | ICD-10-CM | POA: Diagnosis not present

## 2024-10-01 DIAGNOSIS — Z7182 Exercise counseling: Secondary | ICD-10-CM | POA: Diagnosis not present

## 2024-10-01 DIAGNOSIS — R0600 Dyspnea, unspecified: Secondary | ICD-10-CM | POA: Diagnosis not present

## 2024-10-01 DIAGNOSIS — Z713 Dietary counseling and surveillance: Secondary | ICD-10-CM | POA: Diagnosis not present

## 2024-10-01 DIAGNOSIS — I10 Essential (primary) hypertension: Secondary | ICD-10-CM | POA: Diagnosis not present

## 2024-10-01 DIAGNOSIS — J849 Interstitial pulmonary disease, unspecified: Secondary | ICD-10-CM | POA: Diagnosis not present

## 2024-10-20 NOTE — Progress Notes (Signed)
 "     HPI  IOV 03/14/2018  Chief Complaint  Patient presents with   Consult    Has been on chemo for non hodgkins lymphoma since March 15th of 2019. She has been sent ER twice due to SOB.   Alicia [name] Jul 12, 1941 female [address] [place] [address] -has been referred for episodic dyspnea.  She is accompanied by her son.  History is gained from talking to her and review of the oncology records.  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  She has been started on all-CHOP chemotherapy.  Status post 4 cycles at this point.  First cycle was given Friday December 28, 2017.  Cycles are every 3 weeks.  According to her and her son after the first cycle there was no side effects.  Then beginning the second cycle which presumably was given 01/24/18,  exactly on the 6 days she started developing shortness of breath.  The sixth day also corresponded to 3 days after subcutaneous injection NEULESTA to improve her white count.This  resulted in inpatient admission January 24, 2018 for 1 day.  Was associated with very high bP per hx and  severe hypophosphatemia < 1.0 and with correction of phosphorus her symptoms improved. Then 3rd cycle was on 02/08/18 with neulesta on 02/11/18 per chart rview. Then on 02/14/18 (6 days after 3rd cycle, 3 days after neulesta) she ended up in ER . Her phos was slightly low 2.2 and K3.2 . These were corrected and she was discharged.  Saw Dr Onesimo oncology on 02/28/18 - high dose Vit D started because of inability to retain phosphorus. .  Cards referral placed. BP control monitored. Neulesta changed to Granix /Neupogen  for schedule 5/17, 5/20, 5/21, 5/22 Alicia [name].  Then after the third cycle this was changed to Granix  but despite this exactly on the sixth day after chemo she gets short of breath.  Initially the shortness of breath episodes were brief and transitory but with progressive cycles the shortness of breath has become more intense and more  prolonged lasting few to several days.   Subsequently Feb 14, 2018 she had an ER visit for low phosphorus.  At this point in time she is completed 4 cycles of the chemotherapy (not sure date of Cycle #4 but was due 03/08/18) and she had the most intense symptoms after the fourth cycle but decided against going to the emergency department.  Her next fifth cycle is due next week but her oncologist Dr. G Kale has put it on hold pending cardiac and pulmonary work-up.     Review of the labs show that as of 2 days ago she still had low phosphorus.  It appears the low phosphorus correlates with nadir white count but in phone cal wih Dr Onesimo 03/15/2018 -t his is not always consistent.  This is documented below.  She did have pulmonary function test Mar 08, 2018 that I personally visualized and this corresponds to normal PFT except for isolated reduction diffusion capacity to 48% even accounting for anemia correction hemoglobin was around 11 g% that day.[name]  However walk test today was normal.  She had CT angiogram PE protocol January 14, 2018 this to rule out pulmonary embolism.  She has bibasal atelectasis that could potentially account for the lower diffusion but the CT chest was much earlier time point.  Her most recent chest x-ray Feb 14, 2018 look clear to me.  All images personally visualized by me  Exam nitric oxide  today is 11 ppb and effectively rules out eosinophilic asthma.  She has upcoming cardiac stress test tomorrow but echocardiogram recently March 2019 was satisfactory and she has been reassured by Dr. Wadie that probability for coronary ischemia is very low.   Also according to the patient she spoke to the oncology pharmacist at [facility] and was told that most likely this was some red chemotherapy injection causing her problems.  She does not believe this to be Rituxan .  Simple office walk 185 feet x  3 laps goal with forehead probe 03/14/2018   O2 used Room air  Number laps completed 3  Comments  about pace moderate  Resting Pulse Ox/HR 98% and 67/min  Final Pulse Ox/HR 98% and 78/min  Desaturated </= 88% no  Desaturated <= 3% points no  Got Tachycardic >/= 90/min no  Symptoms at end of test Mild dyspnea but continues  Miscellaneous comments      OV 06/06/2018  Chief Complaint  Patient presents with   Follow-up    PFT performed today.  Pt states she has been doing good since last visit since chemo has been stopped. Denies any complaints.     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 Her chemotherapy has been completed/stopped. With this her functional status fatigue and shortness of breath of all improved. She had a follow-up CT scan of the chest and July 2019 that I personally visualized and it shows significant improvement in her pulmonary inflammation. In fact corresponding with thisthe diffusion capacity has improved. She says she never had this problem before the chemotherapy and in fact when I personally visualized and January 2019 cT abdomen lung cut she did not have any ILD. She denies any clinical autoimmune disease but not tested for this. She says her goal of care is to anginal quality of life. She is not interested in lung biopsy. She only wants supportive follow-up. She is open to having some autoimmune and vasculitis panel blood tests Results for ZOA, DOWTY (MRN [id number]) as of 06/06/2018 13:5 Simple office walk 185 feet x  3 laps goal with forehead probe 06/06/2018   O2 used Room air  Number laps completed 3  Comments about pace Normal pace  Resting Pulse Ox/HR 100%% and 56/min  Final Pulse Ox/HR 97% and 75/min  Desaturated </= 88% no  Desaturated <= 3% points yes  Got Tachycardic >/= 90/min no  Symptoms at end of test none  Miscellaneous comments x    History of Present Illness  03/02/2022  Pulmonary/ 1st office eval/ [facility]   Chief Complaint  Patient presents with   Consult    Breathing has worsened since she had cancer in 2019.   Dyspnea:  lots of walking at [employer]/ does not HC parking  Cough: slt raspy voice some cough non productive  Sleep: fine flat SABA use: did not help in past  No obvious day to day or daytime variability or assoc excess/ purulent sputum or mucus plugs or hemoptysis or cp or chest tightness, subjective wheeze or overt sinus or hb symptoms.   Sleeping  without nocturnal  or early am exacerbation  of respiratory  c/o's or need for noct saba. Also denies any obvious fluctuation of symptoms with weather or environmental changes or other aggravating or alleviating factors except as outlined above   No unusual exposure hx or h/o childhood  pna/ asthma or knowledge of premature birth.   OV 10/21/2024 -last seen in 2019 by myself in the old office and in 2023 at [facility] by Dr. Ozell America.  In essence this is like a new visit for me but per CMS criteria does not  Subjective:  Patient ID: Asberry ONEIDA Fuel, female , DOB: 1941-02-23 , age 84 y.o. , MRN: [id number] , ADDRESS: [address] [place] [address] PCP Shona Norleen PEDLAR, MD Patient Care Team: Shona Norleen PEDLAR, MD as PCP - General (Internal Medicine)  This Provider for this visit: Treatment Team:  Attending Provider: Geronimo Amel, MD    10/21/2024 -   Chief Complaint  Patient presents with   Consult    Referred by Dr. Zack Hall for eval of ILD. Pt with worsening DOE over the past couple of years. She gets winded walking at the grocery store and when she does light housework.      HPI KAYLIANA CODD 84 y.o. -KEARIA YIN is an 84 year old female who presents with worsening shortness of breath. She was referred by Dr. Shona for evaluation of her worsening shortness of breath and potential lung issues.  She has been experiencing persistent shortness of breath that has progressively worsened over the past two years. Any  exertion leaves her winded, but resting for about five minutes alleviates the symptoms. She recalls an episode two years ago while sitting and shelling pecans when her breathing became severely impaired, leading to a visit to the emergency room where it was attributed to a panic attack.  She completed chemotherapy several years ago, and during that time, her shortness of breath began to worsen. She mentions that Dr. Marnie previously diagnosed her with acid reflux, but she did not find this explanation satisfactory and did not return to him. She has not been on chemotherapy for some time, although she continued to receive infusions for two years after the initial treatment.  She lives alone and manages her daily activities independently, including cooking, changing clothes, and yard work, although her shortness of breath slows her down and requires her to take breaks. Taking a shower consistently causes her to become breathless, taking about ten to fifteen minutes to complete.  No chest pain. She mentions a chronic cough that she describes as more frequent than she believes it should be. No known allergies and reports no significant seasonal allergies.    SYMPTOM SCALE - ILD 10/21/2024  Current weight   O2 use RA  Shortness of Breath 0 -> 5 scale with 5 being worst (score 6 If unable to do)  At rest 0  Simple tasks - showers, clothes change, eating, shaving 5  Household (dishes, doing bed, laundry) 4.5  Shopping 4.5  Walking level at own pace 4  Walking up Stairs 4.5  Total (30-36) Dyspnea Score 33  How bad is your cough? 1  How bad is your fatigue 0  How bad is appetiee 0  How bad is nausea 0  How bad is vomiting?  0  How bad is diarrhea? 0  How bad is anxiety? 2  How bad is depression 2  Any chronic pain - if so where and how bad 0        SIT STAND TEST - goal 15 times   10/21/2024    O2 used ra   PRobe - finter or forehead finger   Number sit and stand completed - goal 15 49  sec   Time  taken to complete 15 tims   Resting Pulse Ox/HR/Dyspnea  100% and 70/min and dyspnea of 2/10    Peak measures 89 % and 98/min and dyspnea of 5/10   Final Pulse Ox/HR 99% and 81/min and dyspnea of 2/10   Desaturated </= 88% no   Desaturated <= 3% points YES   Got Tachycardic >/= 90/min YES   Miscellaneous comments dyspenic     CT Chest data from date: JULY 2025*  - personally visualized and independently interpreted : yes - my findings are: This is very mild ILD of the lung base  IMPRESSION: 1. No lymphadenopathy or other evidence of recurrent lymphoma in the chest, abdomen, or pelvis. Normal spleen size. 2. Dependent bibasilar varicoid bronchiectasis, irregular interstitial opacity and interlobular septal thickening without clear evidence of subpleural bronchiolectasis or honeycombing, slightly worsened when compared to prior examination and clearly worsened over a longer period of time. Findings are suspicious for fibrotic interstitial lung disease although could reflect chronic, progressing sequelae of ongoing infection or aspiration. Consider pulmonary referral and dedicated interstitial lung disease protocol CT to further assess if indicated by clinical suspicion. 3. Nonobstructive left nephrolithiasis. 4. Descending and sigmoid diverticulosis without evidence of acute diverticulitis. 5. 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 Attention on follow-up.   Aortic Atherosclerosis (ICD10-I70.0).     Electronically Signed   By: Marolyn JONETTA Jaksch M.D.   On: 05/13/2024 10:21  PFT     Latest Ref Rng & Units 06/06/2018    1:25 PM 03/08/2018    7:45 AM  PFT Results  FVC-Pre L 2.74  2.79   FVC-Predicted Pre % 93  94   FVC-Post L  2.76   FVC-Predicted Post %  93   Pre FEV1/FVC % % 79  79   Post FEV1/FCV % %  85   FEV1-Pre L 2.17  2.20   FEV1-Predicted Pre % 98  100   FEV1-Post L   2.35   DLCO uncorrected ml/min/mmHg 15.25  12.38   DLCO UNC% % 56  45   DLCO corrected ml/min/mmHg 15.35  13.17   DLCO COR %Predicted % 56  48   DLVA Predicted % 74  66   TLC L  4.61   TLC % Predicted %  86   RV % Predicted %  74        LAB RESULTS last 96 hours No results found.       has a past medical history of Arthritis, Dementia (HCC), Dyspnea, GERD (gastroesophageal reflux disease), Hypercholesterolemia, Hypertension, and Non Hodgkin's lymphoma (HCC).   reports that she has never smoked. She has never used smokeless tobacco.  Past Surgical History:  Procedure Laterality Date   LYMPH NODE BIOPSY Left 11/27/2017   Procedure: LEFT INGUINAL LYMPH NODE BIOPSY;  Surgeon: Aron Shoulders, MD;  Location: [facility] OR;  Service: General;  Laterality: Left;   MRI  01/19/2021   with anesthesia at [facility] REMOVAL Left 04/14/2021   Procedure: PORT REMOVAL;  Surgeon: Aron Shoulders, MD;  Location: [facility] OR;  Service: General;  Laterality: Left;   PORTACATH PLACEMENT Left 12/12/2017   Procedure: INSERTION PORT-A-CATH;  Surgeon: Aron Shoulders, MD;  Location:  SURGERY CENTER;  Service: General;  Laterality: Left;   TONSILLECTOMY      Allergies[1]  Immunization History  Administered Date(s) Administered   Moderna Sars-Covid-2 Vaccination 01/08/2020, 02/10/2020, 10/24/2020    Family History  Problem Relation Age of Onset   Lung cancer Mother    Kidney cancer Father    Alcoholism Brother    Emphysema Maternal Aunt    Cancer Maternal Uncle    Cancer Maternal Uncle     Current Medications[2]      Objective:   Vitals:   10/21/24 0823  BP: 136/84  Pulse: 70  SpO2: 96%  Weight: 137 lb (62.1 kg)  Height: 5' 6 (1.676 m)    Estimated body mass index is 22.11 kg/m as calculated from the following:   Height as of this encounter: 5' 6 (1.676 m).   Weight as of this encounter: 137 lb (62.1 kg).  @WEIGHTCHANGE @  [employer]   10/21/24 0823   Weight: 137 lb (62.1 kg)     Physical Exam   General: No distress.  Looks anxious gets labored while talking O2 at rest: NO Cane present: NO Sitting in wheel chair: NO Frail: Somewhat yes Obese: NO Neuro: Alert and Oriented x 3. GCS 15. Speech normal Psych: Pleasant Resp:  Barrel Chest - NO.  Wheeze - NO, Crackles -perhaps crackles at the lung base in the right, No overt respiratory distress CVS: Normal heart sounds. Murmurs - NO Ext: Stigmata of Connective Tissue Disease - NO HEENT: Normal upper airway. PEERL +. No post nasal drip        Assessment/     Assessment & Plan DOE (dyspnea on exertion)  Chronic cough  ILD (interstitial lung disease) (HCC)  ANA positive    PLAN Patient Instructions     ICD-10-CM   1. DOE (dyspnea on exertion)  R06.09     2. Chronic cough  R05.3     3. ILD (interstitial lung disease) (HCC)  J84.9     4. ANA positive  R76.89      Unclear casue of worsening shortness of breath. Could be the basaL right greater than left interstitial lung disease but as of July 2025 this appeared mild. So need to see if ILD has progressed or if there are other causes for symptos. tHERE is possible exerciseu hypoxemia but needs retest  Plan - Restage interstitial lung disease  - High-resolution CT chest at [facility]  - Get pulmonary function test at [facility]  - Get overnight pulse oximetry   - if abnormal will need night o2  - Repeat walking desaturation test at next visit   - if abnormal will need portable o2  - Get blood work for serology today  - Take interstitial lung disease question and bring it back with you at next visit  - Regarding cough  - Do blood work for CBC differential and blood IgE and based on results might need RAST allergy panel  - Regarding shortness of breath  - Get echocardiogram   -Rechallenge for exercise hypoxemia test at next visit  Follow-up - Within 4 weeks with nurse practitioner to discuss results (at  [facility]bridge locations)  - Consider pulmonary rehabilitation at follow-up      FOLLOWUP    Return for - Within 4 weeks with nurse practitioner to discuss results (at [facility] drawbridge locations).    SIGNATURE    Dr. Dorethia Cave, M.D., F.C.C.P,  Pulmonary and Critical Care Medicine Staff Physician, [facility] Center Director - Interstitial Lung Disease  Program  Pulmonary Fibrosis [facility] at [facility] [place], [place], [address]  Pager: [phone], If no answer or between  15:00h - 7:00h: call [phone]  [id number] Telephone: [phone]  9:01 AM 10/21/2024    [1] No Known Allergies [2]  Current Outpatient Medications:    acetaminophen  (TYLENOL ) 500 MG tablet, Take 250 mg by mouth at bedtime., Disp: , Rfl:    bisoprolol -hydrochlorothiazide (ZIAC) 5-6.25 MG tablet, Take 2 tablets by mouth daily., Disp: , Rfl:    diphenhydramine -acetaminophen  (TYLENOL  PM) 25-500 MG TABS tablet, Take 1 tablet by mouth at bedtime., Disp: , Rfl:    losartan (COZAAR) 25 MG tablet, Take 25 mg by mouth daily., Disp: , Rfl:    Multiple Vitamins-Minerals (PRESERVISION AREDS) CAPS, Take 2 capsules by mouth daily., Disp: , Rfl:    naproxen sodium (ALEVE) 220 MG tablet, Take 220 mg by mouth daily as needed (Pain)., Disp: , Rfl:    simvastatin  (ZOCOR ) 20 MG tablet, Take 20 mg by mouth at bedtime., Disp: , Rfl:   "

## 2024-10-21 ENCOUNTER — Ambulatory Visit (HOSPITAL_COMMUNITY)
Admission: RE | Admit: 2024-10-21 | Discharge: 2024-10-21 | Disposition: A | Discharge status: Home / Self Care | Source: Ambulatory Visit | Attending: Internal Medicine | Admitting: Internal Medicine

## 2024-10-21 ENCOUNTER — Encounter: Payer: Self-pay | Admitting: Hematology

## 2024-10-21 ENCOUNTER — Ambulatory Visit: Admitting: Internal Medicine

## 2024-10-21 ENCOUNTER — Encounter: Payer: Self-pay | Admitting: Internal Medicine

## 2024-10-21 ENCOUNTER — Telehealth: Payer: Self-pay | Admitting: Internal Medicine

## 2024-10-21 VITALS — BP 136/84 | HR 70 | Ht 66.0 in | Wt 137.0 lb

## 2024-10-21 DIAGNOSIS — R0609 Other forms of dyspnea: Secondary | ICD-10-CM | POA: Insufficient documentation

## 2024-10-21 DIAGNOSIS — J849 Interstitial pulmonary disease, unspecified: Secondary | ICD-10-CM | POA: Insufficient documentation

## 2024-10-21 DIAGNOSIS — R053 Chronic cough: Secondary | ICD-10-CM

## 2024-10-21 DIAGNOSIS — R7689 Other specified abnormal immunological findings in serum: Secondary | ICD-10-CM | POA: Diagnosis present

## 2024-10-21 LAB — CBC WITH DIFFERENTIAL/PLATELET
Basophils Absolute: 0 K/uL (ref 0.0–0.1)
Basophils Relative: 0.5 % (ref 0.0–3.0)
Eosinophils Absolute: 0.1 K/uL (ref 0.0–0.7)
Eosinophils Relative: 1 % (ref 0.0–5.0)
HCT: 42.7 % (ref 36.0–46.0)
Hemoglobin: 14.1 g/dL (ref 12.0–15.0)
Lymphocytes Relative: 29.6 % (ref 12.0–46.0)
Lymphs Abs: 2.2 K/uL (ref 0.7–4.0)
MCHC: 33 g/dL (ref 30.0–36.0)
MCV: 89.2 fl (ref 78.0–100.0)
Monocytes Absolute: 0.6 K/uL (ref 0.1–1.0)
Monocytes Relative: 8.1 % (ref 3.0–12.0)
Neutro Abs: 4.4 K/uL (ref 1.4–7.7)
Neutrophils Relative %: 60.8 % (ref 43.0–77.0)
Platelets: 164 K/uL (ref 150.0–400.0)
RBC: 4.79 Mil/uL (ref 3.87–5.11)
RDW: 13.8 % (ref 11.5–15.5)
WBC: 7.3 K/uL (ref 4.0–10.5)

## 2024-10-21 LAB — PULMONARY FUNCTION TEST
DL/VA % pred: 85 %
DL/VA: 3.44 ml/min/mmHg/L
DLCO cor % pred: 63 %
DLCO cor: 12.45 ml/min/mmHg
DLCO unc % pred: 65 %
DLCO unc: 12.71 ml/min/mmHg
FEF 25-75 Pre: 1.37 L/s
FEF2575-%Pred-Pre: 104 %
FEV1-%Pred-Pre: 91 %
FEV1-Pre: 1.82 L
FEV1FVC-%Pred-Pre: 103 %
FEV6-%Pred-Pre: 92 %
FEV6-Pre: 2.35 L
FEV6FVC-%Pred-Pre: 103 %
FVC-%Pred-Pre: 89 %
FVC-Pre: 2.41 L
Pre FEV1/FVC ratio: 75 %
Pre FEV6/FVC Ratio: 98 %
RV % pred: 64 %
RV: 1.67 L
TLC % pred: 78 %
TLC: 4.2 L

## 2024-10-21 NOTE — Telephone Encounter (Signed)
 Per Dolanda at [employer]-this ono needs to state how ono is to be done...SABRAon room air, on cpap or on o2 for it to be valid with insurance

## 2024-10-21 NOTE — Patient Instructions (Addendum)
"    ICD-10-CM   1. DOE (dyspnea on exertion)  R06.09     2. Chronic cough  R05.3     3. ILD (interstitial lung disease) (HCC)  J84.9     4. ANA positive  R76.89      Unclear casue of worsening shortness of breath. Could be the basaL right greater than left interstitial lung disease but as of July 2025 this appeared mild. So need to see if ILD has progressed or if there are other causes for symptos. tHERE is possible exerciseu hypoxemia but needs retest  Plan - Restage interstitial lung disease  - High[facility]resolution CT chest at [facility]  - Get pulmonary function test at [facility]  - Get overnight pulse oximetry   - if abnormal will need night o2  - Repeat walking desaturation test at next visit   - if abnormal will need portable o2  - Get blood work for serology today  - Take interstitial lung disease question and bring it back with you at next visit  - Regarding cough  - Do blood work for CBC differential and blood IgE and based on results might need RAST allergy panel  - Regarding shortness of breath  - Get echocardiogram   -Rechallenge for exercise hypoxemia test at next visit  Follow-up - Within 4 weeks with nurse practitioner to discuss results (at [facility] locations)  - Consider pulmonary rehabilitation at follow-up   "

## 2024-10-22 NOTE — Telephone Encounter (Signed)
 Ordered now on room air

## 2024-10-22 NOTE — Addendum Note (Signed)
 Addended by: GERONIMO AMEL on: 10/22/2024 01:04 PM   Modules accepted: Orders

## 2024-10-23 NOTE — Telephone Encounter (Signed)
 Order has been sent. NFN

## 2024-10-23 NOTE — Telephone Encounter (Signed)
 Another order placed with specifications to complete on room air

## 2024-10-24 LAB — QUANTIFERON-TB GOLD PLUS
Mitogen-NIL: 7.32 [IU]/mL
NIL: 0.04 [IU]/mL
QuantiFERON-TB Gold Plus: NEGATIVE
TB1-NIL: <0 [IU]/mL
TB2-NIL: 0 [IU]/mL

## 2024-10-24 LAB — IGE: IgE (Immunoglobulin E), Serum: 12 kU/L

## 2024-10-24 LAB — CYCLIC CITRUL PEPTIDE ANTIBODY, IGG: Cyclic Citrullin Peptide Ab: <16 U

## 2024-10-24 LAB — RHEUMATOID FACTOR: Rheumatoid fact SerPl-aCnc: <10 [IU]/mL

## 2024-10-24 LAB — ANA: Anti Nuclear Antibody (ANA): NEGATIVE

## 2024-10-28 ENCOUNTER — Ambulatory Visit: Attending: Internal Medicine

## 2024-10-28 DIAGNOSIS — R053 Chronic cough: Secondary | ICD-10-CM

## 2024-10-28 DIAGNOSIS — R0609 Other forms of dyspnea: Secondary | ICD-10-CM

## 2024-10-28 DIAGNOSIS — R7689 Other specified abnormal immunological findings in serum: Secondary | ICD-10-CM

## 2024-10-28 DIAGNOSIS — J849 Interstitial pulmonary disease, unspecified: Secondary | ICD-10-CM

## 2024-10-29 LAB — ECHOCARDIOGRAM COMPLETE
AR max vel: 3.11 cm2
AV Area VTI: 3.19 cm2
AV Area mean vel: 2.6 cm2
AV Mean grad: 4.9 mmHg
AV Peak grad: 8.8 mmHg
AV Vena cont: 0.4 cm
Ao pk vel: 1.49 m/s
Area-P 1/2: 2.9 cm2
Calc EF: 58.9 %
P 1/2 time: 840 ms
S' Lateral: 2.2 cm
Single Plane A2C EF: 67.9 %
Single Plane A4C EF: 54.9 %

## 2024-11-02 ENCOUNTER — Encounter: Payer: Self-pay | Admitting: Cardiology

## 2024-11-02 DIAGNOSIS — I351 Nonrheumatic aortic (valve) insufficiency: Secondary | ICD-10-CM | POA: Insufficient documentation

## 2024-11-02 NOTE — Progress Notes (Unsigned)
 "    Cardiology Office Note   Date:  11/05/2024   ID:  Alicia [name], DOB January 26, 1941, MRN [id number]  PCP:  Shona Norleen PEDLAR, MD  Cardiologist:   None Referring:  Shona Norleen PEDLAR, MD  No chief complaint on file.     History of Present Illness: Alicia [name] is a 84 y.o. female who presents for evaluation  of shortness of breath.  She saw Dr. Court in 2019 for evaluation of SOB.  She had a negative perfusion study.    EF was 60 - 65%.   There is no significant valvular abnormalities.  There is moderate LVH.  There was mild AI.   She has been followed by Dr. Geronimo for evaluation of shortness of breath.  She does have interstitial lung disease and is going to get a high-resolution CT.  She had pulmonary function testing which demonstrated moderate diffusion defect with minimal obstruction and restriction.  She sent here to evaluate possibly a cardiac component to her shortness of breath.  She is breathless in the office today.  Sat was 90%.  She gets dyspneic with minimal activity.  She says she gets some spells and it takes her a while to recover.  She is having some mild tachypnea here that recovers if she is not moving around.  She is also noticed that her blood pressure has been going up.  She gets dizzy.  She is checked her blood pressure and has noted it to be at 150/100.  She actually went to the emergency room at [facility] and that was noted.  There was no change to medicines.  Recently she has been on a low dose of Cozaar  and she does not think it has brought her pressure down.  She is not describing substernal chest pressure, neck or arm discomfort.  She has had no new cough fevers or chills.  She has had no weight gain or edema.  Of note she did have an echo with an EF of 60 to 65%.  She had normal pulmonary pressures.  There was very mild aortic regurgitation.   Past Medical History:  Diagnosis Date   Arthritis    back and hips   Dementia (HCC)    mild per patient   GERD  (gastroesophageal reflux disease)    TUMS   Hypercholesterolemia    Hypertension    Non Hodgkin's lymphoma (HCC)     Past Surgical History:  Procedure Laterality Date   LYMPH NODE BIOPSY Left 11/27/2017   Procedure: LEFT INGUINAL LYMPH NODE BIOPSY;  Surgeon: Aron Shoulders, MD;  Location: [facility] OR;  Service: General;  Laterality: Left;   MRI  01/19/2021   with anesthesia at [facility] REMOVAL Left 04/14/2021   Procedure: PORT REMOVAL;  Surgeon: Aron Shoulders, MD;  Location: [facility] OR;  Service: General;  Laterality: Left;   PORTACATH PLACEMENT Left 12/12/2017   Procedure: INSERTION PORT-A-CATH;  Surgeon: Aron Shoulders, MD;  Location:  SURGERY CENTER;  Service: General;  Laterality: Left;   TONSILLECTOMY       Current Outpatient Medications  Medication Sig Dispense Refill   acetaminophen  (TYLENOL ) 500 MG tablet Take 250 mg by mouth at bedtime.     bisoprolol -hydrochlorothiazide (ZIAC) 5-6.25 MG tablet Take 2 tablets by mouth daily.     diphenhydramine -acetaminophen  (TYLENOL  PM) 25-500 MG TABS tablet Take 1 tablet by mouth at bedtime.     losartan  (COZAAR ) 50 MG tablet Take 1 tablet (50 mg total)  by mouth daily. 90 tablet 3   Multiple Vitamins-Minerals (PRESERVISION AREDS) CAPS Take 2 capsules by mouth daily.     naproxen sodium (ALEVE) 220 MG tablet Take 220 mg by mouth daily as needed (Pain).     simvastatin  (ZOCOR ) 20 MG tablet Take 20 mg by mouth at bedtime.     No current facility-administered medications for this visit.    Allergies:   Patient has no known allergies.    Social History:  The patient  reports that she has never smoked. She has never used smokeless tobacco. She reports that she does not drink alcohol and does not use drugs.   Family History:  The patient's family history includes Alcoholism in her brother; Cancer in her maternal uncle and maternal uncle; Emphysema in her maternal aunt; Kidney cancer in her father; Lung cancer in her mother.     ROS:  Please see the history of present illness.   Otherwise, review of systems are positive for none.   All other systems are reviewed and negative.    PHYSICAL EXAM: VS:  BP (!) 169/81   Pulse 64   Ht 5' 6 (1.676 m)   Wt 137 lb 1.6 oz (62.2 kg)   SpO2 99%   BMI 22.13 kg/m  , BMI Body mass index is 22.13 kg/m. GENERAL:  Well appearing HEENT:  Pupils equal round and reactive, fundi not visualized, oral mucosa unremarkable NECK:  No jugular venous distention, waveform within normal limits, carotid upstroke brisk and symmetric, no bruits, no thyromegaly LYMPHATICS:  No cervical, inguinal adenopathy LUNGS:  Clear to auscultation bilaterally BACK:  No CVA tenderness CHEST:  Unremarkable HEART:  PMI not displaced or sustained,S1 and S2 within normal limits, no S3, no S4, no clicks, no rubs, no murmurs ABD:  Flat, positive bowel sounds normal in frequency in pitch, no bruits, no rebound, no guarding, no midline pulsatile mass, no hepatomegaly, no splenomegaly EXT:  2 plus pulses throughout, no edema, no cyanosis no clubbing SKIN:  No rashes no nodules NEURO:  Cranial nerves II through XII grossly intact, motor grossly intact throughout [facility]:  Cognitively intact, oriented to person place and time    EKG:  EKG Interpretation Date/Time:  Wednesday November 05 2024 14:23:04 EST Ventricular Rate:  64 PR Interval:  182 QRS Duration:  78 QT Interval:  444 QTC Calculation: 458 R Axis:   -58  Text Interpretation: Normal sinus rhythm Left axis deviation Nonspecific T wave abnormality When compared with ECG of 27-Sep-2024 10:50, No significant change since last tracing Confirmed by Lavona Rattan ([id number]) on 11/05/2024 3:08:30 PM     Recent Labs: 05/07/2024: ALT 17 09/27/2024: BUN 21; Creatinine, Ser 0.89; Potassium 3.4; Sodium 139 10/21/2024: Hemoglobin 14.1; Platelets 164.0    Lipid Panel No results found for: CHOL, TRIG, HDL, CHOLHDL, VLDL, LDLCALC, LDLDIRECT     Wt Readings from Last 3 Encounters:  11/05/24 137 lb 1.6 oz (62.2 kg)  10/21/24 137 lb (62.1 kg)  09/27/24 135 lb (61.2 kg)      Other studies Reviewed: Additional studies/ records that were reviewed today include: Pulmonary records. Review of the above records demonstrates:  Please see elsewhere in the note.     ASSESSMENT AND PLAN:   SOB: She does have dyspnea and is having appropriate pulmonary workup.  I do not strongly suspect a cardiac etiology but I am going to check a BNP level and a Lexiscan Myoview .  She does not have reactive airway disease so I think she  will tolerate this procedure.  I would like to use the camera in this building though to have her in close proximity during testing.  AI:  This was mild.  Echo done recently was unremarkable.  No change in therapy.  Current medicines are reviewed at length with the patient today.  The patient does not have concerns regarding medicines.  The following changes have been made:  no change  Labs/ tests ordered today include:   Orders Placed This Encounter  Procedures   Pro b natriuretic peptide (BNP)   MYOCARDIAL PERFUSION IMAGING   EKG 12-Lead     Disposition:   FU with with me based on the results of the above.   Signed, Lynwood Schilling, MD  11/05/2024 3:16 PM    [facility]    "

## 2024-11-04 ENCOUNTER — Ambulatory Visit

## 2024-11-05 ENCOUNTER — Ambulatory Visit: Attending: Cardiology | Admitting: Cardiology

## 2024-11-05 ENCOUNTER — Encounter: Payer: Self-pay | Admitting: Cardiology

## 2024-11-05 VITALS — BP 169/81 | HR 64 | Ht 66.0 in | Wt 137.1 lb

## 2024-11-05 DIAGNOSIS — R0602 Shortness of breath: Secondary | ICD-10-CM | POA: Diagnosis not present

## 2024-11-05 DIAGNOSIS — I351 Nonrheumatic aortic (valve) insufficiency: Secondary | ICD-10-CM | POA: Diagnosis not present

## 2024-11-05 MED ORDER — LOSARTAN POTASSIUM 50 MG PO TABS: 50.0000 mg | ORAL_TABLET | Freq: Every day | ORAL | 3 refills | Status: AC

## 2024-11-05 NOTE — Patient Instructions (Signed)
 Medication Instructions:  Increase Cozaar  to 50 mg once daily *If you need a refill on your cardiac medications before your next appointment, please call your pharmacy*  Lab Work: BNP today at [facility] If you have labs (blood work) drawn today and your tests are completely normal, you will receive your results only by: MyChart Message (if you have MyChart) OR A paper copy in the mail If you have any lab test that is abnormal or we need to change your treatment, we will call you to review the results.  Testing/Procedures: Lexiscan Myoview  Stress Test  Follow-Up: At [facility], you and your health needs are our priority.  As part of our continuing mission to provide you with exceptional heart care, our providers are all part of one team.  This team includes your primary Cardiologist (physician) and Advanced Practice Providers or APPs (Physician Assistants and Nurse Practitioners) who all work together to provide you with the care you need, when you need it.  Your next appointment:   As needed  Provider:   Lavona, MD  We recommend signing up for the patient portal called MyChart.  Sign up information is provided on this After Visit Summary.  MyChart is used to connect with patients for Virtual Visits (Telemedicine).  Patients are able to view lab/test results, encounter notes, upcoming appointments, etc.  Non-urgent messages can be sent to your provider as well.   To learn more about what you can do with MyChart, go to [email or web address].

## 2024-11-06 ENCOUNTER — Telehealth (HOSPITAL_COMMUNITY): Payer: Self-pay | Admitting: *Deleted

## 2024-11-06 LAB — PRO B NATRIURETIC PEPTIDE: NT-Pro BNP: 303 pg/mL (ref 0–738)

## 2024-11-06 NOTE — Telephone Encounter (Signed)
 Left detailed instructions for stress test.

## 2024-11-09 ENCOUNTER — Ambulatory Visit: Payer: Self-pay | Admitting: Cardiology

## 2024-11-11 ENCOUNTER — Ambulatory Visit (HOSPITAL_COMMUNITY)
Admission: RE | Admit: 2024-11-11 | Discharge: 2024-11-11 | Disposition: A | Discharge status: Home / Self Care | Source: Ambulatory Visit | Attending: Internal Medicine | Admitting: Internal Medicine

## 2024-11-11 DIAGNOSIS — R053 Chronic cough: Secondary | ICD-10-CM | POA: Diagnosis present

## 2024-11-11 DIAGNOSIS — R7689 Other specified abnormal immunological findings in serum: Secondary | ICD-10-CM | POA: Insufficient documentation

## 2024-11-11 DIAGNOSIS — R0609 Other forms of dyspnea: Secondary | ICD-10-CM | POA: Insufficient documentation

## 2024-11-11 DIAGNOSIS — J849 Interstitial pulmonary disease, unspecified: Secondary | ICD-10-CM | POA: Insufficient documentation

## 2024-11-11 NOTE — Telephone Encounter (Signed)
 Letter sent with results. Results sent to PCP

## 2024-11-12 ENCOUNTER — Encounter: Admitting: Orthopedic Surgery

## 2024-11-12 ENCOUNTER — Other Ambulatory Visit: Payer: Self-pay | Admitting: Cardiology

## 2024-11-12 DIAGNOSIS — R0602 Shortness of breath: Secondary | ICD-10-CM

## 2024-11-13 ENCOUNTER — Ambulatory Visit (HOSPITAL_COMMUNITY): Admission: RE | Admit: 2024-11-13 | Source: Ambulatory Visit | Attending: Cardiology | Admitting: Cardiology

## 2024-11-13 ENCOUNTER — Telehealth (HOSPITAL_COMMUNITY): Payer: Self-pay

## 2024-11-13 NOTE — Telephone Encounter (Signed)
 The patient called in because she drank coffee and forgot about her test. Alicia [name] CCT

## 2024-11-14 ENCOUNTER — Ambulatory Visit: Payer: Self-pay | Admitting: Internal Medicine

## 2024-11-14 NOTE — Progress Notes (Signed)
 Normal echo . Some age related mild heart muscle stiffness and mild leaky heart valves. Discuss at followuop

## 2024-11-17 ENCOUNTER — Ambulatory Visit (HOSPITAL_COMMUNITY): Admission: RE | Admit: 2024-11-17 | Source: Ambulatory Visit | Attending: Cardiology | Admitting: Cardiology

## 2024-11-18 ENCOUNTER — Ambulatory Visit (HOSPITAL_BASED_OUTPATIENT_CLINIC_OR_DEPARTMENT_OTHER)

## 2024-11-18 ENCOUNTER — Telehealth (HOSPITAL_COMMUNITY): Payer: Self-pay | Admitting: *Deleted

## 2024-11-18 NOTE — Telephone Encounter (Signed)
 Patient given detailed instructions per Myocardial Perfusion Study Information Sheet for the test on 11/20/24 Patient notified to arrive 15 minutes early and that it is imperative to arrive on time for appointment to keep from having the test rescheduled.  If you need to cancel or reschedule your appointment, please call the office within 24 hours of your appointment. . Patient verbalized understanding.Claudene Ronal Quale, RN

## 2024-11-19 ENCOUNTER — Encounter: Payer: Self-pay | Admitting: Orthopedic Surgery

## 2024-11-19 ENCOUNTER — Other Ambulatory Visit: Payer: Self-pay

## 2024-11-19 ENCOUNTER — Ambulatory Visit: Admitting: Orthopedic Surgery

## 2024-11-19 VITALS — BP 73/54 | HR 74 | Ht 66.0 in | Wt 135.0 lb

## 2024-11-19 DIAGNOSIS — M25572 Pain in left ankle and joints of left foot: Secondary | ICD-10-CM

## 2024-11-19 DIAGNOSIS — M79672 Pain in left foot: Secondary | ICD-10-CM

## 2024-11-19 NOTE — Patient Instructions (Signed)
While we are working on your approval forCT please go ahead and call to schedule your appointment with [facility] within at least one (1) week.   Central Scheduling [phone]  

## 2024-11-19 NOTE — Progress Notes (Signed)
" °  Intake history:  Chief Complaint  Patient presents with   Ankle Injury    Left      BP (!) 73/54   Pulse 74   Ht 5' 6 (1.676 m)   Wt 135 lb (61.2 kg)   BMI 21.79 kg/m  Body mass index is 21.79 kg/m.  Pharmacy? _____[facility] 14_________________________________  WHAT ARE WE SEEING YOU FOR TODAY?   Left ankle pain   How long has this bothered you? (DOI?DOS?WS?)  approximately 1 years(s) ago Has gotten worse recently  Was there an injury? Yes  Anticoag.  No   Any ALLERGIES __________Allergies[1] ____________________________________   Treatment:  Have you taken:  Tylenol  Yes  Advil No  Had PT No  Had injection No  Other  _____________Aleve___________        [1] No Known Allergies  "

## 2024-11-19 NOTE — Progress Notes (Signed)
" ° °  Patient: Alicia [name]           Date of Birth: 10/23/1940           MRN: [id number] Visit Date: 11/19/2024 Requested by: Shona Norleen PEDLAR, MD [address] [place],  [place] [address] PCP: Shona Norleen PEDLAR, MD  Encounter Diagnosis  Name Primary?   Pain in left ankle and joints of left foot Yes    Assessment and plan:  Rule out fracture fifth metatarsal lateral foot pain  No orders of the defined types were placed in this encounter.    Chief Complaint  Patient presents with   Ankle Injury    Left     History:  [name] comes in with a vague history of pain in her left foot.  She is unclear really when this started but reports that sometime around August she started having lateral foot pain with a knot on the side of her foot.  This was painful but bearable and she noted to herself that she could stand the pain but would get it checked when she could not stand anymore.  She eventually had increasing pain and by November sought medical attention with podiatry.  She says the podiatrist referred her here after he found her fracture.  However, based on his notes it is unclear whether fracture is.  The note says proximal fibula I think he meant proximal fifth metatarsal however the patient notices pain only with weightbearing.  She is having interstitial lung disease related issues and cannot do a lot of activity so as long as she is not active the foot does not hurt  Focused exam findings:  Left foot no obvious abnormalities skin is intact.  Fibula is nontender.  She is tender over the fifth metatarsal as proximal aspect neurovascular exam otherwise intact images noted below  DG Foot Complete Left Result Date: 11/19/2024 Patient complains of pain on the lateral aspect of her left foot over a year X-ray of the foot with a marker in the area of question shows no fracture Impression other than osteopenia normal left foot Addendum there are some degenerative changes at the great toe     Unclear etiology of lateral foot pain recommend CT scan to rule out fracture  We did not place her in a cam walker or boot to mitigate any increased respiratory symptoms from wearing the cam walker  She will follow-up after the CT scan is obtained  "

## 2024-11-20 ENCOUNTER — Ambulatory Visit (HOSPITAL_COMMUNITY)
Admission: RE | Admit: 2024-11-20 | Discharge: 2024-11-20 | Attending: Cardiovascular Disease | Admitting: Cardiovascular Disease

## 2024-11-20 DIAGNOSIS — R0602 Shortness of breath: Secondary | ICD-10-CM

## 2024-11-20 LAB — MYOCARDIAL PERFUSION IMAGING
LV dias vol: 66 mL (ref 46–106)
LV sys vol: 17 mL
Nuc Stress EF: 74 %
Peak HR: 88 {beats}/min
Rest HR: 64 {beats}/min
Rest Nuclear Isotope Dose: 10.9 mCi
SDS: 0
SRS: 1
SSS: 0
ST Depression (mm): 0 mm
Stress Nuclear Isotope Dose: 30.8 mCi
TID: 1.38

## 2024-11-20 MED ORDER — TECHNETIUM TC 99M TETROFOSMIN IV KIT
30.8000 | PACK | Freq: Once | INTRAVENOUS | Status: AC | PRN
  Administered 2024-11-20: 30.8 via INTRAVENOUS

## 2024-11-20 MED ORDER — REGADENOSON 0.4 MG/5ML IV SOLN
0.4000 mg | Freq: Once | INTRAVENOUS | Status: AC
  Administered 2024-11-20: 0.4 mg via INTRAVENOUS

## 2024-11-20 MED ORDER — REGADENOSON 0.4 MG/5ML IV SOLN
INTRAVENOUS | Status: AC
  Filled 2024-11-20: qty 5

## 2024-11-20 MED ORDER — TECHNETIUM TC 99M TETROFOSMIN IV KIT
10.9000 | PACK | Freq: Once | INTRAVENOUS | Status: AC | PRN
  Administered 2024-11-20: 10.9 via INTRAVENOUS

## 2024-11-25 ENCOUNTER — Ambulatory Visit (HOSPITAL_BASED_OUTPATIENT_CLINIC_OR_DEPARTMENT_OTHER)

## 2024-11-27 ENCOUNTER — Ambulatory Visit (HOSPITAL_COMMUNITY)

## 2024-12-04 ENCOUNTER — Ambulatory Visit: Admitting: Orthopedic Surgery

## 2025-05-13 ENCOUNTER — Other Ambulatory Visit

## 2025-05-20 ENCOUNTER — Ambulatory Visit: Admitting: Oncology
